# Patient Record
Sex: Female | Born: 1955
Health system: Southern US, Community
[De-identification: ages and names within clinical notes are randomized; demographics above are authoritative.]

## PROBLEM LIST (undated history)

## (undated) DIAGNOSIS — E119 Type 2 diabetes mellitus without complications: Secondary | ICD-10-CM

## (undated) DIAGNOSIS — E7800 Pure hypercholesterolemia, unspecified: Secondary | ICD-10-CM

## (undated) DIAGNOSIS — C801 Malignant (primary) neoplasm, unspecified: Secondary | ICD-10-CM

## (undated) DIAGNOSIS — B372 Candidiasis of skin and nail: Principal | ICD-10-CM

## (undated) DIAGNOSIS — C029 Malignant neoplasm of tongue, unspecified: Secondary | ICD-10-CM

## (undated) DIAGNOSIS — D630 Anemia in neoplastic disease: Principal | ICD-10-CM

## (undated) DIAGNOSIS — I10 Essential (primary) hypertension: Secondary | ICD-10-CM

## (undated) DIAGNOSIS — K219 Gastro-esophageal reflux disease without esophagitis: Secondary | ICD-10-CM

## (undated) DIAGNOSIS — Z923 Personal history of irradiation: Secondary | ICD-10-CM

## (undated) HISTORY — PX: CERVICAL ABLATION: SHX5771

## (undated) HISTORY — DX: Anemia in neoplastic disease: D63.0

## (undated) HISTORY — DX: Personal history of irradiation: Z92.3

## (undated) HISTORY — DX: Pure hypercholesterolemia, unspecified: E78.00

## (undated) HISTORY — DX: Gastro-esophageal reflux disease without esophagitis: K21.9

## (undated) HISTORY — DX: Candidiasis of skin and nail: B37.2

## (undated) HISTORY — DX: Type 2 diabetes mellitus without complications: E11.9

---

## 2000-05-26 ENCOUNTER — Other Ambulatory Visit: Admission: RE | Admit: 2000-05-26 | Discharge: 2000-05-26 | Payer: Self-pay | Admitting: Obstetrics and Gynecology

## 2001-11-03 ENCOUNTER — Other Ambulatory Visit: Admission: RE | Admit: 2001-11-03 | Discharge: 2001-11-03 | Payer: Self-pay | Admitting: Obstetrics and Gynecology

## 2002-05-02 ENCOUNTER — Ambulatory Visit (HOSPITAL_COMMUNITY): Admission: RE | Admit: 2002-05-02 | Discharge: 2002-05-02 | Payer: Self-pay | Admitting: Obstetrics and Gynecology

## 2002-05-02 ENCOUNTER — Encounter (INDEPENDENT_AMBULATORY_CARE_PROVIDER_SITE_OTHER): Payer: Self-pay | Admitting: *Deleted

## 2002-11-29 ENCOUNTER — Other Ambulatory Visit: Admission: RE | Admit: 2002-11-29 | Discharge: 2002-11-29 | Payer: Self-pay | Admitting: Obstetrics and Gynecology

## 2003-03-01 ENCOUNTER — Ambulatory Visit (HOSPITAL_COMMUNITY): Admission: RE | Admit: 2003-03-01 | Discharge: 2003-03-01 | Payer: Self-pay | Admitting: Obstetrics and Gynecology

## 2003-03-02 ENCOUNTER — Encounter (INDEPENDENT_AMBULATORY_CARE_PROVIDER_SITE_OTHER): Payer: Self-pay

## 2004-01-02 ENCOUNTER — Other Ambulatory Visit: Admission: RE | Admit: 2004-01-02 | Discharge: 2004-01-02 | Payer: Self-pay | Admitting: Obstetrics and Gynecology

## 2005-02-17 ENCOUNTER — Other Ambulatory Visit: Admission: RE | Admit: 2005-02-17 | Discharge: 2005-02-17 | Payer: Self-pay | Admitting: Obstetrics and Gynecology

## 2007-05-20 ENCOUNTER — Encounter: Admission: RE | Admit: 2007-05-20 | Discharge: 2007-05-20 | Payer: Self-pay | Admitting: Obstetrics and Gynecology

## 2007-07-01 ENCOUNTER — Encounter (INDEPENDENT_AMBULATORY_CARE_PROVIDER_SITE_OTHER): Payer: Self-pay | Admitting: Obstetrics and Gynecology

## 2007-07-01 ENCOUNTER — Ambulatory Visit (HOSPITAL_COMMUNITY): Admission: RE | Admit: 2007-07-01 | Discharge: 2007-07-01 | Payer: Self-pay | Admitting: Obstetrics and Gynecology

## 2010-11-04 NOTE — H&P (Signed)
NAME:  ARDATH, LEPAK NO.:  [id number]   MEDICAL RECORD NO.:  [id number]          PATIENT TYPE:  AMB   LOCATION:  [facility]                           FACILITY:  [facility]   PHYSICIAN:  Juluis Mire, M.D.   DATE OF BIRTH:  01-01-1956   DATE OF ADMISSION:  07/01/2007  DATE OF DISCHARGE:                              HISTORY & PHYSICAL   HISTORY:  The patient is a 55 year old gravida 1, para 1, perimenopausal  patient who presents for hysteroscopy.  The patient has had  postmenopausal bleeding with increasing flow.  She has had borderline  FSH that have begun to fluctuate.  She did do a saline infusion  ultrasound and endometrial sampling last year with finding of  proliferative endometrium.  The patient is concerned about continued  bleeding and wished to proceed with hysteroscopic evaluation for which  she is admitted at the present time.   ALLERGIES:  PENICILLIN.   MEDICATIONS:  1. Benicar.  2. Norvasc.   PAST MEDICAL HISTORY:  1. She is being actively managed for hypertension on medications as      noted.  2. Does have a history of asthmatic bronchitis.   PAST SURGICAL HISTORY:  1. She has had a previous hysteroscopy in 2003.  2. Previous cryoablation of the uterus.   OBSTETRICAL HISTORY:  One vaginal delivery.   FAMILY HISTORY:  There is a history of heart disease as well as  diverticulitis and diabetes as well as hypertension.   SOCIAL HISTORY:  No tobacco or alcohol use.   REVIEW OF SYSTEMS:  Noncontributory.   PHYSICAL EXAMINATION:  VITAL SIGNS:  Stable.  The patient is afebrile.  HEENT:  The patient is normocephalic.  Pupils equal, round and reactive  to light and accommodation.  Extraocular movements were intact.  Sclerae  and conjunctivae are clear.  Oropharynx clear.  NECK:  Without thyromegaly.  BREASTS:  No discrete masses.  LUNGS:  Clear.  CARDIOVASCULAR:  Regular rate.  No murmurs or gallops.  ABDOMEN:  Benign.  No masses, organomegaly or  tenderness.  PELVIC:  Normal external genitalia.  Vaginal mucosa is clear.  Cervix  unremarkable.  Uterus normal size, shape and contour.  Adnexa free of  masses or tenderness.  EXTREMITIES:  Trace edema.  NEUROLOGIC:  Grossly within normal limits.   IMPRESSION:  Abnormal uterine bleeding, perimenopausal in nature.   PLAN:  The patient will undergo hysteroscopy for evaluation of the  endometrial cavity.  The risks have been discussed including the risk of  infection.  Risk of hemorrhage that could require transfusion with the  risk of AIDS or hepatitis.  There is also the possible risk of  hysterectomy.  There is a risk of injury to adjacent organs including  bladder, bowel, ureters that could require further exploratory surgery.  Risk of deep venous thrombosis and pulmonary emboli.  The patient voiced  understanding of indications and risks.      Juluis Mire, M.D.  Electronically Signed     JSM/MEDQ  D:  07/01/2007  T:  07/01/2007  Job:  [id number]

## 2010-11-04 NOTE — Op Note (Signed)
NAMEDUSTINE, BERTINI                 ACCOUNT NO.:  [id number]   MEDICAL RECORD NO.:  [id number]          PATIENT TYPE:  AMB   LOCATION:  [facility]                           FACILITY:  [facility]   PHYSICIAN:  Juluis Mire, M.D.   DATE OF BIRTH:  1956/03/26   DATE OF PROCEDURE:  07/01/2007  DATE OF DISCHARGE:                               OPERATIVE REPORT   PREOPERATIVE DIAGNOSIS:  Postmenopausal bleeding.   POSTOPERATIVE DIAGNOSIS:  Postmenopausal bleeding.   PROCEDURE:  Paracervical block.  Hysteroscopy with endometrial  resections and curettings.   SURGEON:  Juluis Mire, M.D.   ANESTHESIA:  General with paracervical block.   ESTIMATED BLOOD LOSS:  Minimal.   PACKS AND DRAINS:  None.   INTRAOPERATIVE BLOOD REPLACED:  None.   COMPLICATIONS:  None.   INDICATIONS:  Noted in history and physical.   PROCEDURE:  Patient was seen, taken to OR, placed supine position.  After satisfactory level of general anesthesia was obtained, the patient  was placed in the dorsal lithotomy position using the Allen stirrups.  The perineum, vagina cleansed out Betadine and draped sterile field.  Speculum was placed in vaginal vault.  The cervix was grasped with  single-tooth tenaculum.  Paracervical blocks ensued using 1% Nesacaine.  Uterus sounded to 8 cm.  Cervix serially dilated to a size 35 Pratt  dilator.  Operative hysteroscope was introduced and the intrauterine  cavity was distended using sorbitol.  Visualization revealed a very  smooth and atrophic endometrium.  No evidence of polyps or other  abnormalities.  Multiple endometrial resections were obtained, sent for  pathological review.  We also did endometrial curettings.  Total deficit  was 25 mL.  Bleeding was minimal.  This point time the speculum and  single-tooth tenaculum removed.  The patient taken out of dorsal  lithotomy position.  Once alert was then extubated transferred to  recovery in good condition.  Sponge, instrument and needle  count  reported as correct by circulating nurse x2.      Juluis Mire, M.D.  Electronically Signed    JSM/MEDQ  D:  07/01/2007  T:  07/01/2007  Job:  [id number]

## 2010-11-07 NOTE — Op Note (Signed)
NAME:  [name], [name]                    ACCOUNT NO.:  [id number]   MEDICAL RECORD NO.:  [id number]                   PATIENT TYPE:  AMB   LOCATION:  [facility]                                  FACILITY:  [facility]   PHYSICIAN:  Juluis Mire, M.D.                DATE OF BIRTH:  Nov 07, 1955   DATE OF PROCEDURE:  05/02/2002  DATE OF DISCHARGE:                                 OPERATIVE REPORT   PREOPERATIVE DIAGNOSES:  Endometrial polyp.   POSTOPERATIVE DIAGNOSES:  Endometrial polyp.   OPERATIVE PROCEDURE:  1. Paracervical block.  2. Hysteroscopy.  3. Resection of endometrial tissue.  4. Endometrial curettings.   SURGEON:  Juluis Mire, M.D.   ANESTHESIA:  Started off with sedation, eventually went to general.   ESTIMATED BLOOD LOSS:  Minimal.   PACKS AND DRAINS:  None.   INTRAOPERATIVE BLOOD PLACED:  None.   COMPLICATIONS:  None.   INDICATIONS:  Dictated in history and physical.   PROCEDURE AS FOLLOWS:  The patient taken to the OR and placed in supine  position.  After a satisfactory level of sedation she was placed in the  dorsal lithotomy position using the Allen stirrups.  The patient was draped  out for hysteroscopy.  Speculum was placed in the vaginal vault.  The cervix  and vagina were cleansed with Betadine.  We placed a paracervical block  using 1% Xylocaine.  Cervix was secured with a single tooth tenaculum.  The  patient continued to move despite being fairly comfortable.  Decision was to  proceed with general anesthesia which we did through an LMA.  At this point  in time we were able to successfully dilate the cervix to a size 39 Pratt  dilator.  The operative hysteroscope was introduced.  Intrauterine cavity  was distended using sorbitol.  Eventually, we were able to visualize  endometrial cavity.  She had multiple shaggy endometrium.  No definitive  polyp was noted.  We resected all the tissue and was sent for pathologic  review.  There was no signs of  perforation.  We did resect anterior,  posterior, and lateral walls.  At the end of the procedure all the shaggy  endometrium had been removed.  We also did endometrial curettings.  There  was no active bleeding or signs of perforation.  The tenaculum  and speculum were then removed.  The patient taken out of the dorsal  lithotomy position.  Once alert and extubated, transferred to recovery room  in good condition.  Sponge, instrument, and needle count reported as correct  by circulating nurse.                                               Juluis Mire, M.D.    JSM/MEDQ  D:  05/02/2002  T:  05/02/2002  Job:  [id number]

## 2010-11-07 NOTE — H&P (Signed)
NAME:  [name], [name]                    ACCOUNT NO.:  [id number]   MEDICAL RECORD NO.:  [id number]                   PATIENT TYPE:  AMB   LOCATION:  [facility]                                  FACILITY:  [facility]   PHYSICIAN:  Juluis Mire, M.D.                DATE OF BIRTH:  11-01-1955   DATE OF ADMISSION:  05/02/2002  DATE OF DISCHARGE:                                HISTORY & PHYSICAL   HISTORY OF PRESENT ILLNESS:  The patient is a 55 year old gravida 1, para 1  white female who presents for hysteroscopic evaluation.   In relation to present admission, the patient has been having trouble with  menorrhagia.  Cycles are approximately seven to nine days in length.  She  changes approximately a pad every two hours for the first four days.  With  this are clots and increasing dysmenorrhea.  Saline infusion ultrasound  revealed numerous endometrial polyps.  She now presents for hysteroscopic  removal.   ALLERGIES:  PENICILLIN.   MEDICATIONS:  Diovan for blood pressure.  This is managed by Dr. Manus Gunning.  She is also on vitamins and calcium replacement.   PAST MEDICAL HISTORY:  Usual childhood diseases except for the above noted  hypertension under management by Dr. Manus Gunning.  She has had no previous  surgical or obstetrical history.   FAMILY HISTORY:  Noncontributory.   SOCIAL HISTORY:  No tobacco or alcohol use.   REVIEW OF SYSTEMS:  Noncontributory.   PHYSICAL EXAMINATION:  VITAL SIGNS:  The patient is afebrile with stable  vital signs.  HEENT:  The patient is normocephalic.  Pupils are equal, round, and reactive  to light and accommodation.  Extraocular movements are intact.  Sclerae and  conjunctivae clear.  Oropharynx clear.  NECK:  Without thyromegaly.  BREASTS:  No discreet masses.  LUNGS:  Clear.  CARDIOVASCULAR:  Regular rhythm and rate without murmurs or gallops.  ABDOMEN:  Benign.  No mass, organomegaly, or tenderness.  PELVIC:  Normal external genitalia.  Vaginal  mucosa clear.  Cervix  unremarkable.  Uterus normal size, shape, and contour.  Adnexa free of  masses or tenderness.  Rectovaginal examination is clear.  EXTREMITIES:  Trace edema.  NEUROLOGIC:  Grossly within normal limits.   IMPRESSION:  Menorrhagia with associated polyps.   PLAN:  The patient will undergo hysteroscopic evaluation and resection.  The  risks of surgery have been discussed including the risk of infection, the  rare risk of hemorrhage that could necessitate transfusion with the risk of  AIDS or  hepatitis, extensive bleeding could require hysterectomy, the risks of  uterine perforation that could lead to injury to adjacent organs including  bladder or bowel that could require further exploratory surgery, the risk of  deep venous thrombosis and pulmonary embolus.  The patient expressed  understanding of indications and risks.  Juluis Mire, M.D.    JSM/MEDQ  D:  05/02/2002  T:  05/02/2002  Job:  [id number]

## 2010-11-07 NOTE — H&P (Signed)
NAME:  [name], [name]                            ACCOUNT NO.:  [id number]   MEDICAL RECORD NO.:  [id number]                   PATIENT TYPE:  AMB   LOCATION:  [facility]                                  [facility]ACILITY:  [facility]   PHYSICIAN:  Juluis Mire, M.D.                DATE OF BIRTH:  1955/10/09   DATE OF ADMISSION:  03/01/2003  DATE OF DISCHARGE:                                HISTORY & PHYSICAL   HISTORY OF PRESENT ILLNESS:  The patient is a 55 year old gravida 1, para 1  married white female who presents for hysteroscopy along with cryo ablation.   The patient had undergone previous hysteroscopic resection for an  endometrial polyp.  Pathology was benign.  Cycles are now lasting 10 days,  three days of flow, changing pads q.2h. with clots.  She denies any  significant dysmenorrhea.  The bleeding is becoming limiting from the  patient's standpoint.  Her hemoglobin has remained stable, but borderline at  12.4.  She is hypertensive on Norvasc, therefore birth control pills are  contraindicated.  Therefore, she presents at the present time for the above  noted surgery.   ALLERGIES:  She is allergic to PENICILLIN.   MEDICATIONS:  1. Norvasc for management of hypertension.  2. ________.   PAST MEDICAL HISTORY:  History of hypertension.  She is under active  management by Bryan Lemma. Manus Gunning, M.D. who also manages her asthmatic  bronchitis.   PAST SURGICAL HISTORY:  She had the previous hysteroscopy with resection of  the endometrium done in November of 2003.   PAST OBSTETRICAL HISTORY:  One spontaneous vaginal delivery.   FAMILY HISTORY:  Noncontributory.   SOCIAL HISTORY:  No tobacco or alcohol use.   REVIEW OF SYSTEMS:  Noncontributory.   PHYSICAL EXAMINATION:  VITAL SIGNS:  The patient is afebrile with stable  vital signs.  HEENT:  The patient normocephalic.  Pupils are equal, round, and reactive to  light and accommodation.  Extraocular movements are intact.  Sclerae and  conjunctivae clear.  Oropharynx clear.  NECK:  Without thyromegaly.  BREASTS:  No discreet masses.  LUNGS:  Clear.  CARDIAC:  Regular rhythm, rate without murmurs or gallops.  ABDOMEN:  Benign.  No mass, organomegaly, or tenderness.  PELVIC:  Normal external genitalia.  Vaginal mucosa clear.  Cervix  unremarkable.  Uterus normal size, shape, and contour.  Adnexa free of  masses or tenderness.  EXTREMITIES:  Trace edema.  NEUROLOGIC:  Grossly within normal limits.   IMPRESSION:  1. Menorrhagia.  2. Hypertension.   PLAN:  The patient will undergo hysteroscopic resection of the endometrium  followed by cryo ablation.  The risks of surgery have been discussed  including the risk of infection, the risk of hemorrhage that could  necessitate transfusion with the risk of AIDS or hepatitis, the risk of  injury to adjacent organs including bladder, bowel, ureters that could  require further exploratory surgery, the risk of deep venous thrombosis and  pulmonary embolus.  The patient expressed understanding of indications and  risks.  Success rate for endometrial ablation of 70% has been quoted.  Continued bleeding problems could occur.                                               Juluis Mire, M.D.    JSM/MEDQ  D:  03/01/2003  T:  03/01/2003  Job:  [id number]

## 2010-11-07 NOTE — Op Note (Signed)
NAME:  [name], [name]                            ACCOUNT NO.:  [id number]   MEDICAL RECORD NO.:  [id number]                   PATIENT TYPE:  AMB   LOCATION:  [facility]                                  FACILITY:  [facility]   PHYSICIAN:  Juluis Mire, M.D.                DATE OF BIRTH:  10-25-1955   DATE OF PROCEDURE:  03/01/2003  DATE OF DISCHARGE:                                 OPERATIVE REPORT   PREOPERATIVE DIAGNOSES:  1. Desires sterility.  2. Menorrhagia.   POSTOPERATIVE DIAGNOSES:  1. Desires sterility.  2. Menorrhagia.  3. Probable hemorrhagic corpus luteum cyst of the right ovary.  4. Minimal pelvic endometriosis.   OPERATIVE PROCEDURES:  1. Diagnostic laparoscopy with bilateral tubal fulguration.  2. Right ovarian cystotomy.  3. Right ovarian biopsy.  4. Cautery of endometriotic implants.  5. Hysteroscopy with resection of endometrium.  6. Cryoablation.   SURGEON:  Juluis Mire, M.D.   ANESTHESIA:  General endotracheal.   ESTIMATED BLOOD LOSS:  Minimal.   PACKS AND DRAINS:  None.   INTRAOPERATIVE BLOOD REPLACEMENT:  None.   COMPLICATIONS:  None.   INDICATIONS FOR PROCEDURE:  Dictated in history and physical.  One  additional issue.  After discussion with the patient she was in desire of  permanent sterilization in view of the coming cryoablation.  We had  discussed the potential irreversibility.  A failure rate of 1 in 200 is  quoted.  Failures can be in the form of ectopic pregnancy requiring further  surgical management.  The risks of surgery were explained including the risk  of incisional infection, risk of vascular injury that could lead to  hemorrhage requiring transfusion and possible exploratory surgery, risk of  injury to adjacent organs requiring further exploratory surgery.  The  patient verbalizes understanding of indications, risks, and limitations.   PROCEDURE AS FOLLOWS:  The patient was taken to the operating room, placed  in the supine position.   After satisfactory level of general endotracheal  was obtained, the patient was placed in the dorsal lithotomy position using  the Allen stirrups.  The abdomen, perineum, and vagina were prepped out with  Betadine.  The bladder was emptied by in-and-out catheterization.  A  speculum was placed in the vaginal vault.  The cervix grasped with a single-  tooth tenaculum.  The Hulka tenaculum was then put in place.  The single-  tooth tenaculum and speculum were then removed.  The patient was draped out  as a sterile field.  A subumbilical  incision made with a knife.  The Veress  needle was introduced into the abdominal cavity.  The abdomen insufflated to  approximately three liters of carbon dioxide.  The operating laparoscope was  then introduced.  Visualization revealed no evidence of injury to adjacent  organs.  The appendix was visualized and noted to be normal.  The upper  abdomen  including the liver, tip of the gallbladder were unremarkable.  The  uterus was elevated.  She did have superficial implants of endometriosis on  the anterior abdominal wall.  The right ovary had an apparent hemorrhagic  corpus luteum cyst.  Both tubes were identified.  The mid segment of tube  was coagulated for a distance of 3 cm.  Coagulation was continued until  resistance read 0 on the meter.  Same segment was recoagulated, completely  desiccating the tube.  Coagulation did extend out to the mesosalpinx.  Both  tubes were adequately coagulated.  The 5-mm trocar was put in place in the  suprapubic area under direct visualization.  The ovary was elevated.  The  cyst was entered and clear fluid with a clot was noted consistent with a  hemorrhagic cyst.  The ovarian wall was biopsied and sent for pathological  review.  We had good hemostasis.  No active bleeding or signs of injury to  adjacent organs.  The abdomen was deflated of its carbon dioxide.  All  trocars removed.  The subumbilical incision closed with  interrupted  subcuticulars of 4-0 Vicryl.  The suprapubic incision was closed with Steri-  Strips.   The patient's legs were repositioned.  The Hulka tenaculum was then removed  and the speculum was placed in the vaginal vault.  The cervix grasped with a  single-tooth tenaculum.  The uterus sounded to approximately 8 cm.  The  cervix was serially dilated to a size 37 Pratt dilator.  Operative  hysteroscope was then introduced into the uterine cavity and was distended  using sorbitol.  Visualization revealed some endometrial thickness.  The  endometrium was resected in all quadrants down to the muscular layer.  All  of this was sent for pathologic review.  There were no signs of perforation,  active bleeding, or other complications.   Cryoablation was then performed on both the right and left uterine cornuas  with a six minute freeze in each area.  There were no signs of complications  or perforation.  At this point in time the procedure was terminated.  The  speculum was removed along with the single-tooth tenaculum.  The patient  taken out of the dorsal lithotomy position, was alert and extubated,  transferred to the recovery room in good condition.  Sponge, instrument, and  needle counts reported as correct by the circulating nurse x2.                                               Juluis Mire, M.D.    JSM/MEDQ  D:  03/01/2003  T:  03/02/2003  Job:  [id number]

## 2011-03-11 LAB — HCG, SERUM, QUALITATIVE: Preg, Serum: NEGATIVE

## 2011-03-11 LAB — CBC
HCT: 39
MCV: 89.7

## 2011-03-11 LAB — BASIC METABOLIC PANEL
BUN: 13
Calcium: 9.6
GFR calc non Af Amer: >60
Glucose, Bld: 121 — ABNORMAL HIGH
Potassium: 3.5

## 2011-06-23 HISTORY — PX: NASAL FRACTURE SURGERY: SHX718

## 2012-02-11 ENCOUNTER — Other Ambulatory Visit: Payer: Self-pay | Admitting: Dermatology

## 2012-11-01 ENCOUNTER — Encounter (HOSPITAL_COMMUNITY): Payer: Self-pay | Admitting: *Deleted

## 2012-11-01 ENCOUNTER — Emergency Department (HOSPITAL_COMMUNITY): Payer: BC Managed Care – PPO

## 2012-11-01 ENCOUNTER — Emergency Department (HOSPITAL_COMMUNITY)
Admission: EM | Admit: 2012-11-01 | Discharge: 2012-11-01 | Disposition: A | Discharge status: Home / Self Care | Payer: BC Managed Care – PPO | Attending: Emergency Medicine | Admitting: Emergency Medicine

## 2012-11-01 DIAGNOSIS — I10 Essential (primary) hypertension: Secondary | ICD-10-CM | POA: Insufficient documentation

## 2012-11-01 DIAGNOSIS — S0010XA Contusion of unspecified eyelid and periocular area, initial encounter: Secondary | ICD-10-CM | POA: Insufficient documentation

## 2012-11-01 DIAGNOSIS — S022XXA Fracture of nasal bones, initial encounter for closed fracture: Secondary | ICD-10-CM | POA: Insufficient documentation

## 2012-11-01 DIAGNOSIS — Y929 Unspecified place or not applicable: Secondary | ICD-10-CM | POA: Insufficient documentation

## 2012-11-01 DIAGNOSIS — S023XXA Fracture of orbital floor, initial encounter for closed fracture: Secondary | ICD-10-CM

## 2012-11-01 DIAGNOSIS — IMO0002 Reserved for concepts with insufficient information to code with codable children: Secondary | ICD-10-CM

## 2012-11-01 DIAGNOSIS — Y9301 Activity, walking, marching and hiking: Secondary | ICD-10-CM | POA: Insufficient documentation

## 2012-11-01 DIAGNOSIS — S0180XA Unspecified open wound of other part of head, initial encounter: Secondary | ICD-10-CM | POA: Insufficient documentation

## 2012-11-01 DIAGNOSIS — S0230XA Fracture of orbital floor, unspecified side, initial encounter for closed fracture: Secondary | ICD-10-CM | POA: Insufficient documentation

## 2012-11-01 DIAGNOSIS — E119 Type 2 diabetes mellitus without complications: Secondary | ICD-10-CM | POA: Insufficient documentation

## 2012-11-01 DIAGNOSIS — W010XXA Fall on same level from slipping, tripping and stumbling without subsequent striking against object, initial encounter: Secondary | ICD-10-CM | POA: Insufficient documentation

## 2012-11-01 DIAGNOSIS — Z79899 Other long term (current) drug therapy: Secondary | ICD-10-CM | POA: Insufficient documentation

## 2012-11-01 DIAGNOSIS — Z7982 Long term (current) use of aspirin: Secondary | ICD-10-CM | POA: Insufficient documentation

## 2012-11-01 HISTORY — DX: Type 2 diabetes mellitus without complications: E11.9

## 2012-11-01 HISTORY — DX: Essential (primary) hypertension: I10

## 2012-11-01 MED ORDER — TRAMADOL HCL 50 MG PO TABS
50.0000 mg | ORAL_TABLET | Freq: Once | ORAL | Status: AC
  Administered 2012-11-01: 50 mg via ORAL
  Filled 2012-11-01: qty 1

## 2012-11-01 MED ORDER — AZITHROMYCIN 250 MG PO TABS
500.0000 mg | ORAL_TABLET | Freq: Once | ORAL | Status: AC
  Administered 2012-11-01: 500 mg via ORAL
  Filled 2012-11-01: qty 2

## 2012-11-01 MED ORDER — FLUORESCEIN SODIUM 1 MG OP STRP: 1.0000 | ORAL_STRIP | Freq: Once | OPHTHALMIC | Status: DC

## 2012-11-01 MED ORDER — AZITHROMYCIN 500 MG PO TABS: 500.0000 mg | ORAL_TABLET | Freq: Every day | ORAL | Status: DC

## 2012-11-01 MED ORDER — TRAMADOL HCL 50 MG PO TABS: 50.0000 mg | ORAL_TABLET | Freq: Four times a day (QID) | ORAL | Status: DC | PRN

## 2012-11-01 NOTE — ED Notes (Signed)
Pt reports "stumbling over my feet" while outside. Reports she fell and hit the right side of her face. Pt has lac to right eye and swelling and bruising noted to right eye lid. Bleeding controlled at present.

## 2012-11-01 NOTE — ED Provider Notes (Signed)
History     CSN: [id number]  Arrival date & time 11/01/12  1105   First MD Initiated Contact with Patient 11/01/12 1153      Chief Complaint  Patient presents with  . Fall  . Facial Laceration    (Consider location/radiation/quality/duration/timing/severity/associated sxs/prior treatment) HPI  [name] is a 57 y.o. female complaining of complaining of right periorbitally hematoma, laceration status post slip and fall while walking earlier in the day. Patient denies loss of consciousness, nausea vomiting, dysarthria, ataxia, neck pain, chest pain, shortness of breath, abdominal pain. Patient states that she is up-to-date on her tetanus because "her doctor keeps up with all of that." She denies change in vision, diplopia, pain with eye movement. However she states it is hard to open her eye because of the significant swelling and bruising.  Past Medical History  Diagnosis Date  . Hypertension   . Diabetes mellitus without complication     "borderline diabetic"     Past Surgical History  Procedure Laterality Date  . Cervical ablation      Uterus    History reviewed. No pertinent family history.  History  Substance Use Topics  . Smoking status: Not on file  . Smokeless tobacco: Not on file  . Alcohol Use: Yes     Comment: occ    OB History   Grav Para Term Preterm Abortions TAB SAB Ect Mult Living                  Review of Systems  Constitutional: Negative for fever.  HENT: Positive for facial swelling.   Respiratory: Negative for shortness of breath.   Cardiovascular: Negative for chest pain.  Gastrointestinal: Negative for nausea, vomiting, abdominal pain and diarrhea.  Skin: Positive for wound.  Neurological: Negative for syncope.  All other systems reviewed and are negative.    Allergies  Penicillins  Home Medications   Current Outpatient Rx  Name  Route  Sig  Dispense  Refill  . amLODipine (NORVASC) 10 MG tablet   Oral   Take 10 mg by mouth  daily.         Marland Kitchen aspirin 81 MG tablet   Oral   Take 81 mg by mouth daily.         . Calcium Carbonate-Vitamin D (OS-CAL 500 + D PO)   Oral   Take 1 tablet by mouth daily.         . Coenzyme Q10 (CO Q 10 PO)   Oral   Take 1 tablet by mouth daily.         Marland Kitchen losartan-hydrochlorothiazide (HYZAAR) 100-12.5 MG per tablet   Oral   Take 1 tablet by mouth daily.         . vitamin C (ASCORBIC ACID) 500 MG tablet   Oral   Take 500 mg by mouth daily.           BP 154/67  Pulse 87  Temp(Src) 97.9 F (36.6 C) (Oral)  Resp 20  Ht 5\' 7"  (1.702 m)  Wt 215 lb (97.523 kg)  BMI 33.67 kg/m2  SpO2 99%  Physical Exam  Nursing note and vitals reviewed. Constitutional: She is oriented to person, place, and time. She appears well-developed and well-nourished. No distress.  HENT:  Head:    Mouth/Throat: Oropharynx is clear and moist.  3 cm partial-thickness laceration to right forehead crossing the eyebrow.  There is no intraoral trauma, battle sign or hemotympanum.   Eyes: Conjunctivae and EOM  are normal. Pupils are equal, round, and reactive to light. Right conjunctiva is not injected. Right conjunctiva has no hemorrhage. Right eye exhibits normal extraocular motion. Right pupil is round and reactive. Pupils are equal.  No corneal abrasion on fluorescein staining.   Neck:  No midline tenderness to palpation or step-offs appreciated. Patient has full range of motion without pain.   Cardiovascular: Normal rate, regular rhythm and intact distal pulses.   Pulmonary/Chest: Effort normal and breath sounds normal. No stridor. No respiratory distress. She has no wheezes. She has no rales. She exhibits no tenderness.  Abdominal: Soft. Bowel sounds are normal. There is no tenderness. There is no rebound and no guarding.  Musculoskeletal: Normal range of motion.  Neurological: She is alert and oriented to person, place, and time.  Cranial nerves III through XII intact, strength 5 out  of 5x4 extremities, negative pronator drift, finger to nose and heel-to-shin coordinated, sensation intact to pinprick and light touch, gait is coordinated and Romberg is negative.   Psychiatric: She has a normal mood and affect.    ED Course  Procedures (including critical care time)  LACERATION REPAIR Performed by: Wynetta Emery Authorized by: Wynetta Emery Consent: Verbal consent obtained. Risks and benefits: risks, benefits and alternatives were discussed Consent given by: patient Patient identity confirmed: Wrist band  Prepped and Draped in normal sterile fashion  Tetanus: Up to date  Laceration Location: Right forehead  Laceration Length: 3 cm  Anesthesia: Local   Local anesthetic: 2% lidocaine with epinephrine  Anesthetic total: 3 ml  Irrigation method: syringe  Amount of cleaning: copious   Wound explored to depth in good light on a bloodless field with no foreign bodies seen or palpated.   Skin closure: 7-0 Ethilon   Number of sutures: 3   Technique: Simple interrupted   Patient tolerance: Patient tolerated the procedure well with no immediate complications.  Antibx ointment applied. Instructions for care discussed verbally and patient provided with additional written instructions for homecare and f/u.  Labs Reviewed - No data to display Ct Orbitss W/o Cm  11/01/2012  *RADIOLOGY REPORT*  Clinical Data: Fall.  Trauma to right side of face.  Laceration. Right periorbital soft tissue swelling  CT ORBITS WITHOUT CONTRAST  Technique:  Multidetector CT imaging of the orbits was performed following the standard protocol without intravenous contrast.  Comparison: None.  Findings: A right orbital floor fracture is present.  There is blood in the right maxillary sinus.  Fat is herniated through the fracture without associated muscle.    Right periorbital soft tissue swelling is present. There is a fracture at the base of the frontal process of the right maxilla.   Minimally-displaced right nasal bone fractures are also evident.  The nasal septum is intact. The remaining paranasal sinuses are clear.  The right zygomatic arch is intact.  Limited imaging of the brain is unremarkable.  Incidental note is made of an on erupted supernumerary tooth in the maxilla.  IMPRESSION:  1.  Right orbital floor fracture without entrapment of the inferior rectus muscle. 2.  No acute blood products in the right maxillary sinus. 3.  Frontal process of right maxilla and right nasal bone fractures with minimal displacement. 4.  Extensive right periorbital hematoma without additional fractures.   Original Report Authenticated By: Marin Roberts, M.D.      1. Laceration   2. Nasal bone fracture, closed, initial encounter   3. Orbital floor fracture, closed, initial encounter  MDM   Filed Vitals:   11/01/12 1118 11/01/12 1449  BP: 154/67 128/61  Pulse: 87 69  Temp: 97.9 F (36.6 C)   TempSrc: Oral   Resp: 20 18  Height: 5\' 7"  (1.702 m)   Weight: 215 lb (97.523 kg)   SpO2: 99% 98%     DORIA FERN is a 57 y.o. female with a right forehead laceration, right eye hematoma. Patient has full extraocular range of motion without pain or diplopia I doubt entrapment. I have had to convince the patient to take pain medication.  Orbital CT shows a orbital floor fracture or with nasal bone fracture. Patient is penicillin allergic so we'll give her azithromycin.  Laceration closed with 3 simple interrupted sutures.  I have advised her to start decongestants and given her ENT followup.  Medications  azithromycin (ZITHROMAX) tablet 500 mg (not administered)  traMADol (ULTRAM) tablet 50 mg (50 mg Oral Given 11/01/12 1310)    VSS and patient is appropriate for, and amenable to, discharge at this time. Pt verbalized understanding and agrees with care plan. Outpatient follow-up and return precautions given.    Discharge Medication List as of 11/01/2012  2:39 PM      START taking these medications   Details  azithromycin (ZITHROMAX) 500 MG tablet Take 1 tablet (500 mg total) by mouth daily., Starting 11/01/2012, Until Discontinued, Print    traMADol (ULTRAM) 50 MG tablet Take 1 tablet (50 mg total) by mouth every 6 (six) hours as needed for pain., Starting 11/01/2012, Until Discontinued, [employer], PA-C 11/02/12 [phone]

## 2012-11-03 NOTE — ED Provider Notes (Signed)
Medical screening examination/treatment/procedure(s) were performed by non-physician practitioner and as supervising physician I was immediately available for consultation/collaboration.  Doug Sou, MD 11/03/12 2006

## 2014-04-13 ENCOUNTER — Other Ambulatory Visit: Payer: Self-pay | Admitting: Family Medicine

## 2014-04-13 DIAGNOSIS — R591 Generalized enlarged lymph nodes: Secondary | ICD-10-CM

## 2014-04-13 DIAGNOSIS — R599 Enlarged lymph nodes, unspecified: Secondary | ICD-10-CM

## 2014-04-20 ENCOUNTER — Ambulatory Visit
Admission: RE | Admit: 2014-04-20 | Discharge: 2014-04-20 | Disposition: A | Discharge status: Home / Self Care | Payer: BC Managed Care – PPO | Source: Ambulatory Visit | Attending: Family Medicine | Admitting: Family Medicine

## 2014-04-20 DIAGNOSIS — R591 Generalized enlarged lymph nodes: Secondary | ICD-10-CM

## 2014-04-20 DIAGNOSIS — R599 Enlarged lymph nodes, unspecified: Secondary | ICD-10-CM

## 2014-04-20 MED ORDER — IOHEXOL 300 MG/ML  SOLN
75.0000 mL | Freq: Once | INTRAMUSCULAR | Status: AC | PRN
  Administered 2014-04-20: 75 mL via INTRAVENOUS

## 2014-04-23 ENCOUNTER — Other Ambulatory Visit (HOSPITAL_COMMUNITY)
Admission: RE | Admit: 2014-04-23 | Discharge: 2014-04-23 | Disposition: A | Discharge status: Home / Self Care | Payer: BC Managed Care – PPO | Source: Ambulatory Visit | Attending: Otolaryngology | Admitting: Otolaryngology

## 2014-04-23 ENCOUNTER — Other Ambulatory Visit: Payer: Self-pay | Admitting: Otolaryngology

## 2014-04-23 DIAGNOSIS — R87810 Cervical high risk human papillomavirus (HPV) DNA test positive: Secondary | ICD-10-CM | POA: Diagnosis present

## 2014-04-23 DIAGNOSIS — C969 Malignant neoplasm of lymphoid, hematopoietic and related tissue, unspecified: Secondary | ICD-10-CM | POA: Diagnosis present

## 2014-04-25 ENCOUNTER — Other Ambulatory Visit: Payer: Self-pay | Admitting: Otolaryngology

## 2014-04-25 DIAGNOSIS — E041 Nontoxic single thyroid nodule: Secondary | ICD-10-CM

## 2014-04-30 ENCOUNTER — Other Ambulatory Visit (HOSPITAL_COMMUNITY): Payer: Self-pay | Admitting: Otolaryngology

## 2014-04-30 DIAGNOSIS — D3709 Neoplasm of uncertain behavior of other specified sites of the oral cavity: Secondary | ICD-10-CM

## 2014-04-30 DIAGNOSIS — D3705 Neoplasm of uncertain behavior of pharynx: Secondary | ICD-10-CM

## 2014-04-30 DIAGNOSIS — R590 Localized enlarged lymph nodes: Secondary | ICD-10-CM

## 2014-04-30 DIAGNOSIS — D3701 Neoplasm of uncertain behavior of lip: Secondary | ICD-10-CM

## 2014-05-01 ENCOUNTER — Telehealth: Payer: Self-pay | Admitting: *Deleted

## 2014-05-01 NOTE — Telephone Encounter (Signed)
Called pt to introduce myself as the oncology nurse navigator that works with Dr. Isidore Moos: 1. Briefly explained my role as a member of her Care Team,  2. Indicated that I would be joining her during her during her consult appt with Dr. Isidore Moos next Friday, 3. Confirmed her understanding of the location of [place], explained the arrival procedure.   4. Discussed the scheduling of a dental evaluation with Dr. Tommie Raymond. She indicated Dr. Erik Obey had already discussed the purpose of this evaluation and has referred her to him.  She is waiting to hear from Dr. Ritta Slot office for scheduling of an appt. She verbalized understanding of information provided.  I provided her my phone #, encouraged her to call me should she have any questions prior to her appt.  [name], RN, BSN, [facility] [phone]

## 2014-05-03 ENCOUNTER — Ambulatory Visit
Admission: RE | Admit: 2014-05-03 | Discharge: 2014-05-03 | Disposition: A | Discharge status: Home / Self Care | Payer: BC Managed Care – PPO | Source: Ambulatory Visit | Attending: Otolaryngology | Admitting: Otolaryngology

## 2014-05-03 DIAGNOSIS — E041 Nontoxic single thyroid nodule: Secondary | ICD-10-CM

## 2014-05-04 ENCOUNTER — Encounter: Payer: Self-pay | Admitting: *Deleted

## 2014-05-04 ENCOUNTER — Encounter (HOSPITAL_COMMUNITY): Payer: Self-pay | Admitting: Dentistry

## 2014-05-04 ENCOUNTER — Ambulatory Visit (HOSPITAL_COMMUNITY): Payer: Self-pay | Admitting: Dentistry

## 2014-05-04 ENCOUNTER — Encounter (HOSPITAL_COMMUNITY): Payer: Self-pay

## 2014-05-04 VITALS — BP 139/64 | HR 82 | Temp 97.8°F

## 2014-05-04 DIAGNOSIS — Z0189 Encounter for other specified special examinations: Secondary | ICD-10-CM

## 2014-05-04 DIAGNOSIS — K0530 Chronic periodontitis, unspecified: Secondary | ICD-10-CM

## 2014-05-04 DIAGNOSIS — K08102 Complete loss of teeth, unspecified cause, class II: Secondary | ICD-10-CM

## 2014-05-04 DIAGNOSIS — D3705 Neoplasm of uncertain behavior of pharynx: Secondary | ICD-10-CM

## 2014-05-04 DIAGNOSIS — M270 Developmental disorders of jaws: Secondary | ICD-10-CM

## 2014-05-04 DIAGNOSIS — K036 Deposits [accretions] on teeth: Secondary | ICD-10-CM

## 2014-05-04 DIAGNOSIS — C109 Malignant neoplasm of oropharynx, unspecified: Secondary | ICD-10-CM

## 2014-05-04 DIAGNOSIS — Z01818 Encounter for other preprocedural examination: Secondary | ICD-10-CM

## 2014-05-04 DIAGNOSIS — K148 Other diseases of tongue: Secondary | ICD-10-CM

## 2014-05-04 DIAGNOSIS — R220 Localized swelling, mass and lump, head: Secondary | ICD-10-CM

## 2014-05-04 DIAGNOSIS — K045 Chronic apical periodontitis: Secondary | ICD-10-CM

## 2014-05-04 DIAGNOSIS — M264 Malocclusion, unspecified: Secondary | ICD-10-CM

## 2014-05-04 NOTE — Progress Notes (Signed)
DENTAL CONSULTATION  Date of Consultation:  05/04/2014 Patient Name:   [name] Date of Birth:   12-24-1955 Medical Record Number: [id number]  VITALS: BP 139/64 mmHg  Pulse 82  Temp(Src) 97.8 F (36.6 C) (Oral)  CHIEF COMPLAINT: The patient was referred by Dr. Erik Obey for a medically necessary pre-chemoradiation therapy dental protocol examination.  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 Patient with anticipated chemoradiation therapy. Patient is now seen as part of a pre-chemoradiation therapy dental protocol examination.  The patient currently denies acute toothaches, swellings, or abscesses. Patient was last seen by [facility] for a dental cleaning and fillings approximately 2-3 years ago. Patient has been unable to keep routine dental appointments due to loss of dental insurance with her job. The patient had previously seen Dr. Eula Listen for her regular dental care and will be continuing her future dental care with his office.  The patient has not had any recent root canal therapy or crown or bridge therapy over the past 3-4 years by her report.  PROBLEM LIST: Patient Active Problem List   Diagnosis Date Noted  . Neoplasm of uncertain behavior of oropharynx 05/04/2014  . Base of tongue mass 05/04/2014    PMH: Past Medical History  Diagnosis Date  . Hypertension   . Diabetes mellitus without complication     "borderline diabetic"   . Hypercholesteremia     PSH: Past Surgical History  Procedure Laterality Date  . Cervical ablation      Uterus  . Nasal fracture surgery  5465    Dr. Erik Obey    ALLERGIES: Allergies  Allergen Reactions  . Penicillins Hives and Other (See Comments)    "Tongue swelling"    MEDICATIONS: Current Outpatient Prescriptions  Medication Sig Dispense Refill  . ALPRAZolam (XANAX) 0.25 MG tablet Take 0.25 mg by mouth as needed for anxiety.     Marland Kitchen amLODipine (NORVASC) 10 MG tablet Take 10 mg by mouth daily.    Marland Kitchen aspirin 81 MG tablet Take 81 mg by mouth daily.    Marland Kitchen atorvastatin (LIPITOR) 10 MG tablet Take 10 mg by mouth daily.    Marland Kitchen losartan-hydrochlorothiazide (HYZAAR) 100-12.5 MG per tablet Take 1 tablet by mouth daily.    . metFORMIN (GLUCOPHAGE) 500 MG tablet Take 500 mg by mouth daily with breakfast.    . Calcium Carbonate-Vitamin D (OS-CAL 500 + D PO) Take 1 tablet by mouth daily.    . vitamin C (ASCORBIC ACID) 500 MG tablet Take 500 mg by mouth daily.     No current facility-administered medications for this visit.    LABS: Lab Results  Component Value Date   WBC 7.6 06/30/2007   HGB 13.7 06/30/2007   HCT 39.0 06/30/2007   MCV 89.7 06/30/2007   PLT 242 06/30/2007      Component Value Date/Time   NA 134* 06/30/2007 1432   K 3.5 06/30/2007 1432   CL 99 06/30/2007 1432   CO2 27 06/30/2007 1432   GLUCOSE 121* 06/30/2007 1432   BUN 13 06/30/2007 1432   CREATININE 0.68 06/30/2007 1432   CALCIUM 9.6 06/30/2007 1432   GFRNONAA >60 06/30/2007 1432   GFRAA  06/30/2007 1432    >60        The eGFR has been calculated using the MDRD equation. This calculation has not been validated in all clinical situations. eGFR's persistently <60 mL/min signify possible Chronic  Kidney Disease.   No results found for: INR, PROTIME No results found for: PTT  SOCIAL HISTORY: History   Social History  . Marital Status: Married    Spouse Name: N/A    Number of Children: 1  . Years of Education: N/A   Occupational History  . Not on file.   Social History Main Topics  . Smoking status: Former Smoker -- 0.50 packs/day for 5 years    Types: Cigarettes    Quit date: 06/22/1974  . Smokeless tobacco: Never Used  . Alcohol Use: 0.0 oz/week    0 Not specified per week     Comment: occl wine  . Drug Use: No  . Sexual Activity: Not on file   Other Topics Concern  . Not on file   Social History Narrative    FAMILY  HISTORY: Family History  Problem Relation Age of Onset  . Hepatitis C Mother   . Heart failure Father     REVIEW OF SYSTEMS: Reviewed with the patient included in dental record.  DENTAL HISTORY: CHIEF COMPLAINT: The patient was referred by Dr. Erik Obey for a medically necessary pre-chemoradiation therapy dental protocol examination.  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 Patient with anticipated chemoradiation therapy. Patient is now seen as part of a pre-chemoradiation therapy dental protocol examination.  The patient currently denies acute toothaches, swellings, or abscesses. Patient was last seen by [facility] for a dental cleaning and fillings approximately 2-3 years ago. Patient has been unable to keep routine dental appointments due to loss of dental insurance with her job. The patient had previously seen Dr. Eula Listen for her regular dental care and will be continuing her future dental care with his office.  The patient has not had any recent root canal therapy or crown or bridge therapy over the past 3-4 years by her report.   DENTAL EXAMINATION: GENERAL: The patient is a well-developed, well-nourished female in no acute distress. HEAD AND NECK: There is right neck lymphadenopathy. I do not palpate any left neck lymphadenopathy. The patient denies acute TMJ symptoms. INTRAORAL EXAM: The patient has normal saliva. The patient has a deep palatal vault. The patient has bilateral mandibular lingual tori. There is no evidence of intraoral abscess formation. DENTITION: The patient is missing tooth numbers 1, 2, 15, 16, 17, 18, 30, and 32. PERIODONTAL: The patient has chronic periodontitis with plaque and calculus accumulations, selective areas gingival recession, and incipient mandibular anterior tooth mobility. DENTAL CARIES/SUBOPTIMAL RESTORATIONS: The patient may have caries on the  distal of tooth #25 or this may be a radiolucent dental resin. This will need to be reevaluated after her dental cleaning with her primary dentist. ENDODONTIC: The patient currently denies acute pulpitis symptoms. Patient has periapical radiolucency associated with tooth #12 and 20. Tooth #12 has had previous root canal therapy with a persistent periapical radiolucency. Patient also has previous root canal therapies associated with tooth numbers 4, 5, 9, 19, 21. None of the teeth with root canal therapy have any acute symptoms. CROWN AND BRIDGE: The patient has multiple crown restorations that appear to be acceptable. PROSTHODONTIC: The patient has no partial dentures. OCCLUSION: The patient has a poor occlusal scheme secondary to missing teeth, supra-eruption and drifting of the unopposed teeth into the edentulous areas, multiple rotated teeth, and crossbites associated with tooth numbers 3/31 and 14/19. The occlusion is stable however.  RADIOGRAPHIC INTERPRETATION:  An orthopantogram was taken and supplemented with a full series of dental radiographs. There are multiple missing teeth. There is supra-eruption and drifting of the unopposed teeth into the edentulous areas. There are multiple rotated teeth. There are multiple root canal therapies. There is a persistent periapical radiolucency associated with the apex of tooth #12. There is a radiolucency associated with the apex of tooth #20.  There are multiple root canal therapies associated with tooth numbers 4, 5, 9, 12, 19, and 21. There are multiple dental restorations noted. There is incipient to moderate bone loss noted.  ASSESSMENTS: 1. 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 2. Anticipated chemoradiation therapy 3. Pre-chemoradiation there be dental protocol examination 4. Chronic periodontitis of bone loss 5. Accretions 6. Gingival recession 7. Incipient mandibular anterior tooth  mobility 8. Chronic apical periodontitis  9. Multiple root canal therapies with persistent periapical radiolucency associated with the apex of tooth #12 10. No acute pulpitis symptoms 11. Mandibular anterior incisal attrition 12. Bilateral mandibular lingual tori 13. Deep palatal vault 14. Possible dental caries associated with the distal of tooth #25. Need assessment after anticipated dental cleaning appointment. 15. Poor occlusal scheme secondary to missing teeth, posterior crossbite, and supra-eruption and drifting of the unopposed teeth into the edentulous areas 16. Stable occlusion  PLAN/RECOMMENDATIONS: 1. I discussed the risks, benefits, and complications of various treatment options with the patient in relationship to her medical and dental conditions, anticipated chemoradiation therapy, and chemoradiation therapy side effects to include xerostomia, radiation caries, trismus, mucositis, taste changes, gum and jawbone changes, and risk for infection and osteoradionecrosis. We discussed various treatment options to include no treatment, selective extractions of teeth in the primary field of radiation therapy, alveoloplasty, pre-prosthetic surgery as indicated, periodontal therapy, dental restorations, root canal therapy, crown and bridge therapy, implant therapy, and replacement of missing teeth as indicated. We discussed referral to an endodontist for evaluation for retreatment or apical surgery of tooth #12 and root canal therapy associated with tooth #20. We discussed possible referral to an oral surgeon for extraction of tooth #31. We discussed follow-up with Dr. Eula Listen (primary dentist) for dental cleaning and evaluation for possible dental caries on tooth #25 on the distal. We discussed impressions for the fabrication of fluoride trays and scatter guards. The patient currently wishes to proceed with impressions today with subsequent fabrication of fluoride trays and scatter guards as  needed. Decision on referral to an endodontist or oral surgeon will be made once final anticipated doses and ports are revealed by Dr. Isidore Moos. Hopefully Dr. Isidore Moos will be able to keep tooth #31 out of the primary field of radiation therapy.   2. Discussion of findings with medical team and coordination of future medical and dental care as needed.  I spent in excess of  120 minutes during the conduct of this consultation and >50% of this time involved direct face-to-face encounter for counseling and/or coordination of the patient's care.    Lenn Cal, DDS

## 2014-05-04 NOTE — Progress Notes (Addendum)
Duplicate entry

## 2014-05-04 NOTE — Patient Instructions (Signed)
RADIATION THERAPY AND DECISIONS REGARDING YOUR TEETH  Xerostomia (dry mouth) Your salivary glands may be in the filed of radiation.  Radiation may include all or part of your saliva glands.  This will cause your saliva to dry up and you will have a dry mouth.  The dry mouth will be for the rest of your life unless your radiation oncologist tells you otherwise.  Your saliva has many functions:  Saliva wets your tongue for speaking.  It coats your teeth and the inside of your mouth for easier movement.  It helps with chewing and swallowing food.  It helps clean away harmful acid and toxic products made by the germs in your mouth, therefore it helps prevent cavities.  It kills some germs in your mouth and helps to prevent gum disease.  It helps to carry flavor to your taste buds.  Once you have lost your saliva you will be at higher risk for tooth decay and gum disease.  What can be done to help improve your mouth when there's not enough saliva:  1.  Your dentist may give a prescription for Salagen.  It will not bring back all of your saliva but may bring back some of it.  Also your saliva may be thick and ropy or white and foamy. It will not feel like it use to feel.  2.  You will need to swish with water every time your mouth feels dry.  YOU CANNOT suck on any cough drops, mints, lemon drops, candy, vitamin C or any other products.  You cannot use anything other than water to make your mouth feel less dry.  If you want to drink anything else you have to drink it all at once and brush afterwards.  Be sure to discuss the details of your diet habits with your dentist or hygienist.  Radiation caries: This is decay that happens very quickly once your mouth is very dry due to radiation therapy.  Normally cavities take six months to two years to become a problem.  When you have dry mouth cavities may take as little as eight weeks to cause you a problem.  This is why dental check ups every two  months are necessary as long as you have a dry mouth. Radiation caries typically, but not always, start at your gum line where it is hard to see the cavity.  It is therefore also hard to fill these cavities adequately.  This high rate of cavities happens because your mouth no longer has saliva and therefore the acid made by the germs starts the decay process.  Whenever you eat anything the germs in your mouth change the food into acid.  The acid then burns a small hole in your tooth.  This small hole is the beginning of a cavity.  If this is not treated then it will grow bigger and become a cavity.  The way to avoid this hole getting bigger is to use fluoride every evening as prescribed by your dentist.  You have to make sure that your teeth are very clean before you use the fluoride.  This fluoride in turn will strengthen your teeth and prepare them for another day of fighting acid.  If you develop radiation caries many times the damage is so large that you will have to have all your teeth removed.  This could be a big problem if some of these teeth are in the field of radiation.  Further details of why this could be   a big problem will follow.  (See Osteoradionecrosis).  Loss of taste (dysgeusia) This happens to varying degrees once you've had radiation therapy to your jaw region.  Many times taste is not completely lost but becomes limited.  The loss of taste is mostly due to radiation affecting your taste buds.  However if you have no saliva in your mouth to carry the flavor to your taste buds it would be difficult for your taste buds to taste anything.  That is why using water or a prescription for Salagen prior to meals and during meals may help with some of the taste.  Keep in mind that taste generally returns very slowly over the course of several months or several years after radiation therapy.  Don't give up hope.  Trismus According to your Radiation Oncologist your TMJ or jaw joints are going to be  partially or fully in the field of radiation.  This means that over time the muscles that help you open and close your mouth may get stiff.  This will potentially result in your not being able to open your mouth wide enough or as wide as you can open it now.  Le me give you an example of how slowly this happens and how unaware people are of it.  A gentlemen that had radiation therapy two years ago came back to me complaining that bananas are just too large for him to be able to fit them in between his teeth.  He was not able to open wide enough to bite into a banana.  This happens slowly and over a period of time.  What do we do to try and prevent this?  Your dentist will probably give you a stack of sticks called a trismus exercise device .  This stack will help your remind your muscles and your jaw joint to open up to the same distance every day.  Use these sticks every morning when you wake up according to the instructions given by the dentist.   You must use these sticks for at least one to two years after radiation therapy.  The reason for that is because it happens so slowly and keeps going on for about two years after radiation therapy.  Your hospital dentist will help you monitor your mouth opening and make sure that it's not getting smaller.  Osteoradionecrosis (ORN) This is a condition where your jaw bone after having had radiation therapy becomes very dry.  It has very little blood supply to keep it alive.  If you develop a cavity that turns into an abscess or an infection then the jaw bone does not have enough blood supply to help fight the infection.  At this point it is very likely that the infection could cause the death of your jaw bone.  When you have dead bone it has to be removed.  Therefore you might end up having to have surgery to remove part of your jaw bone, the part of the jaw bone that has been affected.   Healing is also a problem if you are to have surgery in the areas where the bone  has had radiation therapy.  The same reasons apply.  If you have surgery you need more blood supply which is not available.  When blood supply and oxygen are not available again, there is a chance for the bone to die.  Occasionally ORN happens on its own with no obvious reason.  This is quite rare.  We believe that   patients who continue to smoke and/or drink alcohol have a higher chance of having this bone problem.  Therefore once your jaw bone has had radiation therapy if there are any teeth in that area, you should never have them pulled.  You should also never have any surgery on your teeth or gums in that area unless the oral surgeon or Periodontist is aware of your history of radiation. There is some expensive management techniques that might be used to limit your risks.  The risks for ORN either from infection or spontaneous ( or on it's own) are life long.    TRISMUS  Trismus is a condition where the jaw does not allow the mouth to open as wide as it usually does.  This can happen almost suddenly, or in other cases the process is so slow, it is hard to notice it-until it is too far along.  When the jaw joints and/or muscles have been exposed to radiation treatments, the onset of Trismus is very slow.  This is because the muscles are losing their stretching ability over a long period of time, as long as 2 YEARS after the end of radiation.  It is therefore important to exercise these muscles and joints.  TRISMUS EXERCISES   Stack of tongue depressors measuring the same or a little less than the last documented MIO (Maximum Interincisal Opening).  Secure them with a rubber band on both ends.  Place the stack in the patient's mouth, supporting the other end.  Allow 30 seconds for muscle stretching.  Rest for a few seconds.  Repeat 3-5 times  For all radiation patients, this exercise is recommended in the mornings and evenings unless otherwise instructed.  The exercise should be done for  a period of 2 YEARS after the end of radiation.  MIO should be checked routinely on recall dental visits by the general dentist or the hospital dentist.  The patient is advised to report any changes, soreness, or difficulties encountered when doing the exercises.  TRISMUS  Trismus is a condition where the jaw does not allow the mouth to open as wide as it usually does.  This can happen almost suddenly, or in other cases the process is so slow, it is hard to notice it-until it is too far along.  When the jaw joints and/or muscles have been exposed to radiation treatments, the onset of Trismus is very slow.  This is because the muscles are losing their stretching ability over a long period of time, as long as 2 YEARS after the end of radiation.  It is therefore important to exercise these muscles and joints.  TRISMUS EXERCISES   Stack of tongue depressors measuring the same or a little less than the last documented MIO (Maximum Interincisal Opening).  Secure them with a rubber band on both ends.  Place the stack in the patient's mouth, supporting the other end.  Allow 30 seconds for muscle stretching.  Rest for a few seconds.  Repeat 3-5 times  For all radiation patients, this exercise is recommended in the mornings and evenings unless otherwise instructed.  The exercise should be done for a period of 2 YEARS after the end of radiation.  MIO should be checked routinely on recall dental visits by the general dentist or the hospital dentist.  The patient is advised to report any changes, soreness, or difficulties encountered when doing the exercises.

## 2014-05-04 NOTE — Progress Notes (Signed)
Met with patient and her son after dental consult with Dr. Enrique Sack per his request.  Patient very anxious about upcoming tmts.  I provided overview of RT  and chemotherapy tmts, discussed role of feeding tube, provided tour of RadOnc, including CT SIM and Tomo. I addressed her concerns about hair loss from chemo and positive HPV status.  She expressed that she felt better after our conversation.  I discussed the [facility] [place] scheduled for next Wednesday afternoon and her willingness to attend.  She confirmed her interest.  I provided her an arrival time of 12:00, indicated I would call her next week prior to clinic, encouraged her to call me should she have questions prior to my call.  She verbalized understanding.  Alice Orem, RN, BSN, [facility] [place] [phone]

## 2014-05-07 NOTE — Progress Notes (Signed)
Head and Neck Cancer Location of Tumor / Histology: LYMPHOMA    RIGHT BASE OF TONGUE. LEVEL 3 LYMPH NODE  Ms. [name]. [name] presented on 04/23/14 to [facility] with a 6 week history of report of feeling "like something was in her throat", and that she noted a lump in her neck. 6 weeks prior to this she was . 6 weeks prior to this visit, she was seen by Dr. Marisue Humble and was given Sudafed and a topical  Anesthetic.  After 4-6 weeks, her symptoms had worsened and she was treated with a course of Zithromax with minimal effect. Within a 2 week period she noted the lump in her mid right neck. Described as having a slight potatoe, raspy quality to her voice. Strong Gag Reflex.   CT scan revealed a Large right base of tongue mass and several level II and III suspicious lymph nodes, and a 1.8 cm right thyroid nodule "lump" behind her tongue, exophytic mass right base of tongue and several lymph nodes.  Dr. Redmond Baseman performed a needle aspiration biopsy on this visit.  Biopsies of LYMPH NODE (if applicable) revealed:  53[birth year]46 Diagnosis LYMPH NODE, FINE NEEDLE ASPIRATION LEVEL 3 NODE (SPECIMEN 1 OF 1 COLLECTED ON 04/23/2014) MALIGNANT CELLS PRESENT SEE COMMENT  COMMENT: THERE ARE MALIGNANT CELLS PRESENT, CONSISTENT WITH POORLY DIFFERENTIATED SQUAMOUS CELL CARCINOMA.  PER CLINICIAN REQUEST, A P16 IMMUNOHISTOCHEMICAL STAIN  WILL BE PERFORMED ON THE CELL BLOCK AND THE RESULTS REPORTED SEPARATELY   Nutrition Status:  Weight changes: ?  Swallowing status:   Plans, if any, for PEG tube:  Tobacco/Marijuana/Snuff/ETOH use: smoked 1/2 ppd x 5 years, Quit in 1976, Occasional wine, No Illicit Drug Use  Past/Anticipated interventions by otolaryngology, if any: Dr.  Orpah Greek Bates:Biopsy  Past/Anticipated interventions by medical oncology, if any: Dr.Ni Alvy Bimler - appointment 05/09/14  Referrals yet, to any of the following?  Social Work?   Dentistry? Dr. Teena Dunk - 07/04/13  Swallowing therapy?    Nutrition? Dory Peru - 05/09/14  Med/Onc? Dr. Heath Lark 05/09/14  PEG placement?   SAFETY ISSUES:  Prior radiation? No  Pacemaker/ICD?No  Possible current pregnancy? No  Is the patient on methotrexate? No  Current Complaints / other details:

## 2014-05-08 ENCOUNTER — Telehealth: Payer: Self-pay | Admitting: *Deleted

## 2014-05-08 ENCOUNTER — Telehealth: Payer: Self-pay | Admitting: Radiation Oncology

## 2014-05-08 NOTE — Progress Notes (Addendum)
Radiation Oncology         [phone] ________________________________  Initial outpatient Consultation  Name: [name] MRN: [id number]  Date: 05/09/2014  DOB: [birth year]  CC:   Jodi Marble MD  REFERRING PHYSICIAN: Jodi Marble MD  DIAGNOSIS:    ICD-9-CM ICD-10-CM   1. 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 female who presented with sensation of post nasal dripping, fullness in throat,  and a right neck mass.  CT imaging 10/30 showed: large 4.1 cm BOT mass with right level II/III lymphadenopathy and right thyroid nodule.   Biopsy of right level III node showed: poorly differentiated squamous cell carcinoma, p16+  [place] of thyroid gland was done; findings do not meet current SRU consensus criteria for biopsy. Follow-up by clinical exam is recommended  PET scan on 11-18 - today - negative for metastases.  It highlights disease in the base of tongue and right neck.  She denies dysphagia, stridor, SOB, prior RT, prior ETOH abuse. Quit smoking 39 years ago.  Has lost 26lb in 1 year to address her diabetes. No pain today.     PREVIOUS RADIATION THERAPY: No  PAST MEDICAL HISTORY:  has a past medical history of Hypertension; Diabetes mellitus without complication; and Hypercholesteremia.  Basal cell carcinoma, leg  PAST SURGICAL HISTORY: Past Surgical History  Procedure Laterality Date  . Cervical ablation      Uterus  . Nasal fracture surgery  6767    Dr. Erik Obey    FAMILY HISTORY: family history includes Cancer in her brother, paternal grandfather, and sister; Heart failure in her father; Hepatitis C in her mother.  SOCIAL HISTORY:  reports that she quit smoking about 39 years ago. Her smoking use included Cigarettes. She has a 2.5 pack-year smoking history. She has never used smokeless  tobacco. She reports that she drinks alcohol. She reports that she does not use illicit drugs.  ALLERGIES: Penicillins  MEDICATIONS:  Current Outpatient Prescriptions  Medication Sig Dispense Refill  . ALPRAZolam (XANAX) 0.25 MG tablet Take 0.25 mg by mouth as needed for anxiety.    Marland Kitchen amLODipine (NORVASC) 10 MG tablet Take 10 mg by mouth daily.    Marland Kitchen aspirin 81 MG tablet Take 81 mg by mouth daily.    Marland Kitchen atorvastatin (LIPITOR) 10 MG tablet Take 10 mg by mouth daily.    . Prenatal Vit-Fe Fumarate-FA (MULTIVITAMIN-PRENATAL) 27-0.8 MG TABS tablet Take 1 tablet by mouth daily at 12 noon.    Marland Kitchen dexamethasone (DECADRON) 4 MG tablet Start the day before and the day after chemotherapy, 2 times a day every 3 weeks 30 tablet 1  . lidocaine-prilocaine (EMLA) cream Apply to affected area once 30 g 3  . losartan-hydrochlorothiazide (HYZAAR) 100-12.5 MG per tablet Take 1 tablet by mouth daily.    . metFORMIN (GLUCOPHAGE) 500 MG tablet Take 500 mg by mouth daily with breakfast.    . ondansetron (ZOFRAN) 8 MG tablet Take 1 tablet (8 mg total) by mouth every 8 (eight) hours as needed (Nausea or vomiting). 30 tablet 1  . prochlorperazine (COMPAZINE) 10 MG tablet Take 1 tablet (10 mg total) by mouth every 6 (six) hours as needed (Nausea or vomiting). 30 tablet 1  . sodium fluoride (FLUORISHIELD) 1.1 % GEL dental gel Instill one drop of gel per tooth space of fluoride tray.  Place over teeth for 5 minutes. Remove. Spit out excess. Repeat nightly. 120 mL prn   No current facility-administered medications for this encounter.    REVIEW OF SYSTEMS:  Notable for that above.   PHYSICAL EXAM:  height is 5' 7.5" (1.715 m) and weight is 200 lb 1.6 oz (90.765 kg). Her oral temperature is 98.4 F (36.9 C). Her blood pressure is 134/56 and her pulse is 95. Her respiration is 20 and oxygen saturation is 100%.   General: Alert and oriented, in no acute distress HEENT: Head is normocephalic. Extraocular movements are intact.  Oropharynx - brisk gag reflex; but I believe I saw the superior portion of an exophytic mass behind the left tonsillar region Neck: +right level III 3cm mass; otherwise, no cervical or supraclavicular lymphadenopathy. Heart: Regular in rate and rhythm with no murmurs, rubs, or gallops. Chest: Clear to auscultation bilaterally, with no rhonchi, wheezes, or rales. Abdomen: Soft, nontender, nondistended, with no rigidity or guarding. Extremities: No cyanosis or edema. Lymphatics: see Neck Exam Skin: No concerning lesions. Musculoskeletal: symmetric strength and muscle tone throughout. Neurologic: Cranial nerves II through XII are grossly intact. No obvious focalities. Speech is fluent. Coordination is intact. Psychiatric: Judgment and insight are intact. Affect is appropriate.   ECOG = 1  0 - Asymptomatic (Fully active, able to carry on all predisease activities without restriction)  1 - Symptomatic but completely ambulatory (Restricted in physically strenuous activity but ambulatory and able to carry out work of a light or sedentary nature. For example, light housework, office work)  2 - Symptomatic, <50% in bed during the day (Ambulatory and capable of all self care but unable to carry out any work activities. Up and about more than 50% of waking hours)  3 - Symptomatic, >50% in bed, but not bedbound (Capable of only limited self-care, confined to bed or chair 50% or more of waking hours)  4 - Bedbound (Completely disabled. Cannot carry on any self-care. Totally confined to bed or chair)  5 - Death   Eustace Pen MM, Creech RH, Tormey DC, et al. [phone]). "Toxicity and response criteria of the [facility] Group". [place] Oncol. 5 (6): 649-55   LABORATORY DATA:  Lab Results  Component Value Date   WBC 7.6 06/30/2007   HGB 13.7 06/30/2007   HCT 39.0 06/30/2007   MCV 89.7 06/30/2007   PLT 242 06/30/2007   CMP     Component Value Date/Time   NA 134* 06/30/2007 1432    K 3.5 06/30/2007 1432   CL 99 06/30/2007 1432   CO2 27 06/30/2007 1432   GLUCOSE 121* 06/30/2007 1432   BUN 13 06/30/2007 1432   CREATININE 0.68 06/30/2007 1432   CALCIUM 9.6 06/30/2007 1432   GFRNONAA >60 06/30/2007 1432   GFRAA  06/30/2007 1432    >60        The eGFR has been calculated using the MDRD equation. This calculation has not been validated in all clinical situations. eGFR's persistently <60 mL/min signify possible Chronic Kidney Disease.    No results found for: TSH     RADIOGRAPHY: Ct Soft Tissue Neck W Contrast  04/20/2014   CLINICAL DATA:  Right neck adenopathy.  EXAM: CT NECK WITH CONTRAST  TECHNIQUE: Multidetector CT imaging of the neck was performed using the standard protocol following the bolus administration of intravenous contrast.  CONTRAST:  33m OMNIPAQUE IOHEXOL 300 MG/ML  SOLN  COMPARISON:  CT orbits 11/01/2012  FINDINGS: The visualized portions of the  brain and orbits are unremarkable. The visualized paranasal sinuses and mastoid air cells are clear.  There is a large exophytic mass arising from the right tongue base protruding posteriorly into the oropharynx with effacement of much of the oropharyngeal airway. The mass measures approximately 4.0 x 3.5 x 2.8 cm. The mass contacts the tip of the epiglottis without evidence of direct involvement of the epiglottis. The mass contacts the posterior wall of the oropharynx. There is symmetric prominence of the palatine tonsillar soft tissues. Nasopharynx is unremarkable.  There is enlargement of the right lobe of the thyroid with a 1.8 cm hypoattenuating nodule noted. There is slight leftward tracheal deviation by the thyroid. The submandibular and parotid glands are unremarkable.  An enlarged right level IIB lymph node measures 10 mm in short axis (series 3, image 36). Additional subcentimeter right level IIB lymph nodes are present. 1.1 cm right level IIA lymph node is slightly larger than the corresponding left-sided  node. Immediately inferior to this is a 10 mm level II mildly enlarged lymph node with some maintenance of a fatty hilum. A large right level III lymph node measures 2.7 x 2.3 cm with heterogeneous enhancement suggestive of some small areas of possible necrosis. This corresponds to the palpable abnormality.  Minimal dependent atelectasis is present in the visualized lung apices. Mild cervical disc degeneration and facet arthrosis are noted. No suspicious lytic or blastic osseous lesions are identified depression of the floor of the right orbit is consistent with previously seen fracture which demonstrates interval healing.  IMPRESSION: 1. Large tongue base mass concerning for primary malignancy. 2. Metastatic right level II and III lymphadenopathy. 3. Right thyroid nodule. Further evaluation with thyroid ultrasound is recommended. These results will be called to the ordering clinician or representative by the Radiologist Assistant, and communication documented in the PACS or zVision Dashboard.   Electronically Signed   By: Logan Bores   On: 04/20/2014 15:20   US Soft Tissue Head/neck  05/03/2014   CLINICAL DATA:  Right thyroid nodule by CT.  EXAM: THYROID ULTRASOUND  TECHNIQUE: Ultrasound examination of the thyroid gland and adjacent soft tissues was performed.  COMPARISON:  04/20/2014  FINDINGS: Right thyroid lobe  Measurements: 5.7 x 2.4 x 3.1 cm. Diffusely heterogeneous mildly enlarged right thyroid lobe. Normal vascularity. Small solid well-circumscribed right lower pole nodule only measures 13 x 10 x 12 mm. No other significant focal right thyroid abnormality.  Left thyroid lobe  Measurements: 5.0 x 1.7 x 1.8 cm. Heterogeneous thyroid echotexture without a discrete nodule or mass.  Isthmus  Thickness: 7.5 mm.  No nodules visualized.  Lymphadenopathy  Abnormal right cervical adenopathy which correlates with the neck CT, concerning for metastatic right cervical nodes related to the posterior base of tongue  mass.  IMPRESSION: Heterogeneous asymmetric enlargement of the right thyroid lobe diffusely.  13 mm right lower pole incidental thyroid nodule.  Right cervical adenopathy, as above. This has already been biopsied by ENT.  Findings do not meet current SRU consensus criteria for biopsy. Follow-up by clinical exam is recommended. If patient has known risk factors for thyroid carcinoma, consider follow-up ultrasound in 12 months. If patient is clinically hyperthyroid, consider nuclear medicine thyroid uptake and scan.Reference: Management of Thyroid Nodules Detected at [place]: Society of Radiologists in [place]. Radiology 2005; [id number].   Electronically Signed   By: Daryll Brod M.D.   On: 05/03/2014 16:27   Nm Pet Image Initial (pi) Skull Base To Thigh  05/09/2014  CLINICAL DATA:  Initial treatment strategy for head neck cancer.  EXAM: NUCLEAR MEDICINE PET SKULL BASE TO THIGH  TECHNIQUE: 9.6 mCi F-18 FDG was injected intravenously. Full-ring PET imaging was performed from the skull base to thigh after the radiotracer. CT data was obtained and used for attenuation correction and anatomic localization.  FASTING BLOOD GLUCOSE:  Value: 147 mg/dl  COMPARISON:  CT neck 04/20/2014  FINDINGS: NECK  There is intense FDG uptake associated with the large tongue base mass. The mass measures approximately 3.9 x 3.8 cm and has an SUV max equal to 13.5. Hypermetabolic right level 2 cervical node measures 1.4 x 0.9 cm and has an SUV max equal to 2.3. Right-sided level 3 lymph node measures 2 x 2.3 cm and has an SUV max equal to 7.4. No hypermetabolic left cervical lymph nodes. Nodule within the right lobe of thyroid gland extending into the isthmus measures 2.8 cm and has an SUV max equal to 4.1.  CHEST  No hypermetabolic mediastinal or hilar nodes. No suspicious pulmonary nodules on the CT scan.  ABDOMEN/PELVIS  No abnormal hypermetabolic activity within the liver, pancreas, adrenal glands, or  spleen. Gallstone identified. No hypermetabolic lymph nodes in the abdomen or pelvis.  SKELETON  No focal hypermetabolic activity to suggest skeletal metastasis.  IMPRESSION: 1. There is intense FDG uptake associated with the large tongue base mass concerning for primary malignancy. 2. Malignant range FDG uptake is associated with the metastatic right level 2 and 3 lymph nodes. 3. No evidence for metastatic disease to the chest abdomen or pelvis.   Electronically Signed   By: Kerby Moors M.D.   On: 05/09/2014 10:35     IMPRESSION/PLAN:  This is a delightful 58 year old woman with stage IVA squamous cell carcinoma of the base of tongue. HPV status +, negative for ETOH abuse /  negative smoking history. The patient is a good candidate for radiotherapy. The patient has been discussed in detail at tumor board and seen in the context of multidisciplinary clinic today. Plan is as below:   1) The patient has met with med/onc to discuss chemotherapy - anticipate induction chemotherapy following by RT  2) Referral has been made to dentistry for dental evaluation/extractions in preparation for radiation in the vicinity of the mouth . Consult complete.  Decision on referral to an endodontist or oral surgeon will be made once final anticipated doses and ports are revealed by me to Dr. Enrique Sack. #31 is the tooth of concern.  I will let Dr. Celesta Gentile know that I believe this will be kept under 50Gy with IMRT.  3) Today in multidisciplinary clinic the patient will see Polo Riley from social work for social support  4) Today in multidisciplinary clinic she will see nutrition for nutrition support    5) Will refer to swallowing therapy for evaluation and prophylactic treatment as needed for dysphagia, which can worsen during or after radiotherapy.   6) Physical therapist will see patient today in [facility] clinic for neck measurements due to risk of lymphedema in neck; may benefit from PT for this after completion  of radiotherapy. The patient also may benefit from PT for potentional deconditioning after treatment    7) Simulation once cleared by medical oncology. Anticipate 7 weeks of RT - 70 Gy in 35 fractions.   8) Baseline TSH lab due to risk of hypothyroidism from RT   It was a pleasure meeting the patient today. We discussed the risks, benefits, and side effects of radiotherapy.  We talked in detail about acute and late effects. Shee understands that some of the most bothersome acute effects will be significant soreness of the mouth and throat, changes in taste, changes in salivary function, skin irritation, hair loss, dehydration, weight loss and fatigue. We talked about late effects which include but are not necessarily limited to dysphagia, hypothyroidism, dry mouth, trismus, neck edema. No guarantees of treatment were given.  The patient is enthusiastic about proceeding with treatment. I look forward to participating in the patient's care.   __________________________________________   Eppie Gibson, MD

## 2014-05-08 NOTE — Telephone Encounter (Signed)
[name] calling to confirm all appointments here at the cancer ctr for tomorrow, 05/09/14.

## 2014-05-08 NOTE — Telephone Encounter (Signed)
Spoke with patient, confirmed her 12:15 arrival for tomorrow's H&N MDC, arrival procedure to Radiation Oncology Waiting, answered her question re: arrival for PET tomorrow morning.  She indicated her husband and sister will be joining her tomorrow.  [name], RN, BSN, [place] [facility] [place] [phone]

## 2014-05-09 ENCOUNTER — Encounter: Payer: Self-pay | Admitting: Hematology and Oncology

## 2014-05-09 ENCOUNTER — Ambulatory Visit (HOSPITAL_COMMUNITY): Payer: Self-pay | Admitting: Dentistry

## 2014-05-09 ENCOUNTER — Ambulatory Visit (HOSPITAL_COMMUNITY)
Admission: RE | Admit: 2014-05-09 | Discharge: 2014-05-09 | Disposition: A | Discharge status: Home / Self Care | Payer: BC Managed Care – PPO | Source: Ambulatory Visit | Attending: Otolaryngology | Admitting: Otolaryngology

## 2014-05-09 ENCOUNTER — Ambulatory Visit (HOSPITAL_BASED_OUTPATIENT_CLINIC_OR_DEPARTMENT_OTHER): Payer: BC Managed Care – PPO | Admitting: Hematology and Oncology

## 2014-05-09 ENCOUNTER — Ambulatory Visit: Payer: BC Managed Care – PPO | Admitting: Nutrition

## 2014-05-09 ENCOUNTER — Encounter: Payer: Self-pay | Admitting: Radiation Oncology

## 2014-05-09 ENCOUNTER — Telehealth: Payer: Self-pay | Admitting: Hematology and Oncology

## 2014-05-09 ENCOUNTER — Encounter (HOSPITAL_COMMUNITY): Payer: Self-pay

## 2014-05-09 ENCOUNTER — Encounter: Payer: Self-pay | Admitting: *Deleted

## 2014-05-09 ENCOUNTER — Other Ambulatory Visit: Payer: Self-pay | Admitting: *Deleted

## 2014-05-09 ENCOUNTER — Encounter (HOSPITAL_COMMUNITY): Payer: Self-pay | Admitting: Dentistry

## 2014-05-09 ENCOUNTER — Ambulatory Visit
Admission: RE | Admit: 2014-05-09 | Discharge: 2014-05-09 | Disposition: A | Discharge status: Home / Self Care | Payer: BC Managed Care – PPO | Source: Ambulatory Visit | Attending: Radiation Oncology | Admitting: Radiation Oncology

## 2014-05-09 ENCOUNTER — Other Ambulatory Visit: Payer: Self-pay | Admitting: Hematology and Oncology

## 2014-05-09 ENCOUNTER — Ambulatory Visit: Payer: BC Managed Care – PPO | Attending: Radiation Oncology | Admitting: Physical Therapy

## 2014-05-09 VITALS — BP 146/60 | HR 87 | Temp 97.5°F

## 2014-05-09 VITALS — BP 134/56 | HR 95 | Temp 98.4°F | Resp 20 | Ht 67.5 in | Wt 200.1 lb

## 2014-05-09 DIAGNOSIS — I10 Essential (primary) hypertension: Secondary | ICD-10-CM | POA: Insufficient documentation

## 2014-05-09 DIAGNOSIS — M436 Torticollis: Secondary | ICD-10-CM | POA: Insufficient documentation

## 2014-05-09 DIAGNOSIS — C01 Malignant neoplasm of base of tongue: Secondary | ICD-10-CM | POA: Insufficient documentation

## 2014-05-09 DIAGNOSIS — E119 Type 2 diabetes mellitus without complications: Secondary | ICD-10-CM

## 2014-05-09 DIAGNOSIS — Z510 Encounter for antineoplastic radiation therapy: Secondary | ICD-10-CM | POA: Diagnosis present

## 2014-05-09 DIAGNOSIS — Z463 Encounter for fitting and adjustment of dental prosthetic device: Secondary | ICD-10-CM

## 2014-05-09 DIAGNOSIS — Z0189 Encounter for other specified special examinations: Secondary | ICD-10-CM

## 2014-05-09 DIAGNOSIS — C760 Malignant neoplasm of head, face and neck: Secondary | ICD-10-CM | POA: Diagnosis not present

## 2014-05-09 DIAGNOSIS — Z5189 Encounter for other specified aftercare: Secondary | ICD-10-CM | POA: Diagnosis present

## 2014-05-09 DIAGNOSIS — C770 Secondary and unspecified malignant neoplasm of lymph nodes of head, face and neck: Secondary | ICD-10-CM | POA: Diagnosis not present

## 2014-05-09 DIAGNOSIS — Z87891 Personal history of nicotine dependence: Secondary | ICD-10-CM | POA: Insufficient documentation

## 2014-05-09 DIAGNOSIS — E780 Pure hypercholesterolemia: Secondary | ICD-10-CM | POA: Diagnosis not present

## 2014-05-09 DIAGNOSIS — R590 Localized enlarged lymph nodes: Secondary | ICD-10-CM

## 2014-05-09 DIAGNOSIS — Z7982 Long term (current) use of aspirin: Secondary | ICD-10-CM | POA: Diagnosis not present

## 2014-05-09 DIAGNOSIS — D3705 Neoplasm of uncertain behavior of pharynx: Secondary | ICD-10-CM

## 2014-05-09 DIAGNOSIS — D3701 Neoplasm of uncertain behavior of lip: Secondary | ICD-10-CM

## 2014-05-09 DIAGNOSIS — D3709 Neoplasm of uncertain behavior of other specified sites of the oral cavity: Secondary | ICD-10-CM

## 2014-05-09 DIAGNOSIS — Z01818 Encounter for other preprocedural examination: Secondary | ICD-10-CM

## 2014-05-09 DIAGNOSIS — R634 Abnormal weight loss: Secondary | ICD-10-CM

## 2014-05-09 HISTORY — DX: Type 2 diabetes mellitus without complications: E11.9

## 2014-05-09 LAB — GLUCOSE, CAPILLARY: Glucose-Capillary: 147 mg/dL — ABNORMAL HIGH (ref 70–99)

## 2014-05-09 MED ORDER — SODIUM FLUORIDE 1.1 % DT GEL: DENTAL | Status: AC

## 2014-05-09 MED ORDER — DEXAMETHASONE 4 MG PO TABS: ORAL_TABLET | ORAL | Status: DC

## 2014-05-09 MED ORDER — ONDANSETRON HCL 8 MG PO TABS: 8.0000 mg | ORAL_TABLET | Freq: Three times a day (TID) | ORAL | Status: DC | PRN

## 2014-05-09 MED ORDER — FLUDEOXYGLUCOSE F - 18 (FDG) INJECTION
9.6000 | Freq: Once | INTRAVENOUS | Status: AC | PRN
  Administered 2014-05-09: 9.6 via INTRAVENOUS

## 2014-05-09 MED ORDER — PROCHLORPERAZINE MALEATE 10 MG PO TABS: 10.0000 mg | ORAL_TABLET | Freq: Four times a day (QID) | ORAL | Status: DC | PRN

## 2014-05-09 MED ORDER — LIDOCAINE-PRILOCAINE 2.5-2.5 % EX CREA: TOPICAL_CREAM | CUTANEOUS | Status: DC

## 2014-05-09 NOTE — Therapy (Signed)
Physical Therapy Evaluation  Patient Details  Name: [name] MRN: [id number] Date of Birth: Sep 05, 1955  Encounter Date: 05/09/2014      PT End of Session - 05/09/14 1516    Visit Number 1   Number of Visits 1   Date for PT Re-Evaluation 07/08/14   PT Start Time 1430   PT Stop Time 1500   PT Time Calculation (min) 30 min      Past Medical History  Diagnosis Date  . Hypertension   . Diabetes mellitus without complication     "borderline diabetic"   . Hypercholesteremia     Past Surgical History  Procedure Laterality Date  . Cervical ablation      Uterus  . Nasal fracture surgery  5621    Dr. Erik Obey    There were no vitals taken for this visit.  Visit Diagnosis:  Stiffness of neck - Plan: PT plan of care cert/re-cert      Subjective Assessment - 05/09/14 1503    Pertinent History Pt. with 6 week h/o feeling "like something was in her throat" and a lump in her neck presented 04/23/14 to Dr. Melida Quitter.  Diagnosed with malignant cells consistent with squamous cell carcinoma by fine needle aspiration.  CT showed several level II and II suspicious lymph nodes.   Currently in Pain? No/denies          [facility] PT Assessment - 05/09/14 0001    Assessment   Medical Diagnosis probably squamous cell carcinoma right base of tongue   Onset Date 04/23/14   Precautions   Precautions Other (comment)   Precaution Comments cancer precautions   Restrictions   Weight Bearing Restrictions No   Balance Screen   Has the patient fallen in the past 6 months No  [place] 2 years ago so is cautious   Has the patient had a decrease in activity level because of a fear of falling?  No   Is the patient reluctant to leave their home because of a fear of falling?  No   Home Environment   Living Enviornment Private residence   Living Arrangements Spouse/significant other   Home Layout One level   Prior Function   Level of [place] with basic ADLs   Observation/Other  Assessments   Observations Neck tissue is soft except for right side fullness   Functional Tests   Functional tests Sit to Stand   Sit to Stand   Comments 16 reps in 30 seconds   Posture/Postural Control   Posture/Postural Control Postural limitations   Postural Limitations Forward head   AROM   Overall AROM  Deficits   Overall AROM Comments neck limited 25% in extension and bilateral sidebend but others WFL; bilateral shoulders WFL   Ambulation/Gait   Ambulation/Gait Yes   Ambulation/Gait Assistance 7: Independent   Assistive device None            PT Education - 05/09/14 1515    Education provided Yes   Education Details neck ROM, posture, walking (or pedaling stationary bike), lymphedema info   Person(s) Educated Patient;Spouse;Other (comment)  sister present   Methods Explanation;Handout   Comprehension Verbalized understanding              Plan - 05/09/14 1517    Clinical Impression Statement Pt. with mild limitations in neck AROM currently; otherwise no immediate needs identified.   Pt will benefit from skilled therapeutic intervention in order to improve on the following deficits Decreased range of motion  Rehab Potential Excellent   PT Frequency One time visit   PT Treatment/Interventions Therapeutic exercise;Patient/family education   PT Next Visit Plan none at this time; may need therapy going forward if lymphedema develops or neck tightness worsens   Consulted and Agree with Plan of Care Patient        Problem List Patient Active Problem List   Diagnosis Date Noted  . Cancer of base of tongue 05/09/2014  . Neoplasm of uncertain behavior of oropharynx 05/04/2014  . Base of tongue mass 05/04/2014            LYMPHEDEMA/ONCOLOGY QUESTIONNAIRE - 05/09/14 1512    Type   Cancer Type probably squamous cell at right base of tongue   Lymphedema Assessments   Lymphedema Assessments Head and Neck   Head and Neck   4 cm superior to sternal notch  around neck 40 cm   6 cm superior to sternal notch around neck 39.5 cm   8 cm superior to sternal notch around neck 40.7 cm                                          Head and Neck Clinic Goals - 05/09/14 1520    Patient will be able to verbalize understanding of a home exercise program for cervical range of motion, posture, and walking.    Status Achieved   Patient will be able to verbalize understanding of proper sitting and standing posture.    Status Achieved   Patient will be able to verbalize understanding of lymphedema risk and availability of treatment for this condition.    Status Achieved        [place], PT 05/09/2014, 3:23 PM

## 2014-05-09 NOTE — Progress Notes (Signed)
PATIENT IS SEEN IN HEAD AND NECK CLINIC.  58 year old female diagnosed with cancer of the right base of the tongue.  She is a patient of Dr. Isidore Moos and Dr. Alvy Bimler.  Past medical history includes hypertension, hypercholesterolemia, and borderline diabetes mellitus.  Medications include Xanax, Lipitor, calcium with vitamin D, Glucophage, and vitamin C.  Labs were reviewed.  Height: 67.5 inches. Weight: 200.1 pounds. Usual body weight: 215 pounds BMI: 30.86.  Patient is to receive induction chemotherapy.  Plan is for feeding tube placement. Patient is anxious about treatment plan.  Denies current nutrition issues.  Nutrition Diagnosis:  Predicted suboptimal energy intake related to new diagnosis of tongue cancer as evidenced by history or presence of this condition for which research shows an increased incidence of suboptimal energy intake.  Intervention:  Educated to consume high-calorie, high-protein foods in small, frequent meals daily. Recommended patient avoid weight loss. Provided fact sheets on increasing calories and protein along with recipes and coupons for nutrition shakes. Encouraged patient to take nausea medications as prescribed and educated patient on strategies for eating if she develops nausea and vomiting.  Fact sheet provided. If patient loses greater than 5% usual body weight, will initiate tube feeding to supplement oral intake. Questions were answered.  Teach back method used.  Patient has my contact information.  Monitoring, evaluation, goals: Patient will tolerate adequate calories and protein for minimal weight loss throughout treatment.    Next visit: Friday, November 20, during chemotherapy.  **Disclaimer: This note was dictated with voice recognition software. Similar sounding words can inadvertently be transcribed and this note may contain transcription errors which may not have been corrected upon publication of note.**

## 2014-05-09 NOTE — Telephone Encounter (Signed)
lvm for pt regarding to All not appts....lvm for Tiffany in IR to sched picc placement.

## 2014-05-09 NOTE — Assessment & Plan Note (Addendum)
We discussed in great detail the rationale behind induction chemotherapy followed by radiation. With this strategy, it is hopeful that we can avoid placement of feeding tube and tracheostomy. Intention of treatment is curative. She would need placement of PICC line. I will try to expedite everything. She will need blood draw, placement of PICC line, chemotherapy education class, premedications, and to start chemotherapy in 2 days' time. I will see her next week to assess toxicity. The risks, benefit, side effects of chemotherapy using Taxotere, cisplatin and 5-FU is fully discussed with the patient and she agreed to proceed. She will receive further information from chemotherapy education class tomorrow. The patient is instructed to take premedication with dexamethasone start tomorrow. She is instructed to monitor her blood sugar carefully while on treatment.

## 2014-05-09 NOTE — Progress Notes (Signed)
05/09/2014  Patient Name:   [name] Date of Birth:   11-Mar-1956 Medical Record Number: [id number]  BP 146/60 mmHg  Pulse 87  Temp(Src) 97.5 F (36.4 C) (Oral)  [name] now presents for insertion of upper and lower fluoride trays and scatter protection devices.  PROCEDURE: Appliances were tried in and adjusted as needed. [place] Trismus device was previously fabricated 50 mm using 30 sticks. Postop instructions were provided and a written and verbal format concerning the use and care of appliances. All questions were answered. We will consider referral to an oral surgeon or proceed with dental extraction of lower right molar as indicated pending formal evaluation by medical and radiation oncology team. Patient was instructed to follow-up with her primary dentist for a dental cleaning, evaluation for root canal therapy of upper left and lower left premolars, and possible dental restoration of mandibular anterior tooth #25. Patient to call if questions or problems arise before then.  Lenn Cal, DDS

## 2014-05-09 NOTE — Progress Notes (Signed)
[facility] NOTE  Patient Care Team: Brooks Sailors, RN as Oncology Nurse [place], RD as Dietitian (Nutrition) Eppie Gibson, MD as Attending Physician (Radiation Oncology)  CHIEF COMPLAINTS/PURPOSE OF CONSULTATION:  Squamous cell carcinoma of the base of the tongue.  HISTORY OF PRESENTING ILLNESS:  [name] 58 y.o. female is here because of newly diagnosed squamous cell carcinoma of the tongue. Symptoms began approximately 2 months ago with increased secretion at the back of her mouth requiring her to spit out clear saliva. It has been going on for 6-8 weeks and she thought it was due to sinus drainage. She was seen by her his primary doctor and was prescribed over-the-counter allergy medications and antibiotics with no resolution. She also started to notice right neck swelling a month ago.  she denies any hearing deficit, difficulties with chewing food, swallowing difficulties, painful swallowing, changes in the quality of her voice or abnormal weight loss. I review her records intensively and discuss her case at tumor board this morning.   Cancer of base of tongue   04/20/2014 Imaging CT neck showed advanced base of tongue mass and multiple right neck lymphadenopathy   04/23/2014 Pathology Results [id number] FNA of right neck is positive for squamous cell cancer, P16 positive  PET/CT scan showed today showed no evidence of distant metastatic disease.  MEDICAL HISTORY:  Past Medical History  Diagnosis Date  . Hypertension   . Diabetes mellitus without complication     "borderline diabetic"   . Hypercholesteremia   . Diabetes mellitus type 2, controlled, without complications 86/76/1951    SURGICAL HISTORY: Past Surgical History  Procedure Laterality Date  . Cervical ablation      Uterus  . Nasal fracture surgery  [id number]    Dr. Erik Obey    SOCIAL HISTORY: History   Social History  . Marital Status: Married    Spouse Name: N/A     Number of Children: 1  . Years of Education: N/A   Occupational History  . Not on file.   Social History Main Topics  . Smoking status: Former Smoker -- 0.50 packs/day for 5 years    Types: Cigarettes    Quit date: 06/22/1974  . Smokeless tobacco: Never Used  . Alcohol Use: 0.0 oz/week    0 Not specified per week     Comment: occl wine  . Drug Use: No  . Sexual Activity: Not on file   Other Topics Concern  . Not on file   Social History Narrative    FAMILY HISTORY: Family History  Problem Relation Age of Onset  . Hepatitis C Mother   . Heart failure Father   . Cancer Sister     melanoma  . Cancer Brother     melanoma  . Cancer Paternal Grandfather     colon ca    ALLERGIES:  is allergic to penicillins.  MEDICATIONS:  Current Outpatient Prescriptions  Medication Sig Dispense Refill  . ALPRAZolam (XANAX) 0.25 MG tablet Take 0.25 mg by mouth as needed for anxiety.    Marland Kitchen amLODipine (NORVASC) 10 MG tablet Take 10 mg by mouth daily.    Marland Kitchen aspirin 81 MG tablet Take 81 mg by mouth daily.    Marland Kitchen atorvastatin (LIPITOR) 10 MG tablet Take 10 mg by mouth daily.    Marland Kitchen dexamethasone (DECADRON) 4 MG tablet Start the day before and the day after chemotherapy, 2 times a day every 3 weeks 30 tablet 1  .  lidocaine-prilocaine (EMLA) cream Apply to affected area once 30 g 3  . losartan-hydrochlorothiazide (HYZAAR) 100-12.5 MG per tablet Take 1 tablet by mouth daily.    . metFORMIN (GLUCOPHAGE) 500 MG tablet Take 500 mg by mouth daily with breakfast.    . ondansetron (ZOFRAN) 8 MG tablet Take 1 tablet (8 mg total) by mouth every 8 (eight) hours as needed (Nausea or vomiting). 30 tablet 1  . Prenatal Vit-Fe Fumarate-FA (MULTIVITAMIN-PRENATAL) 27-0.8 MG TABS tablet Take 1 tablet by mouth daily at 12 noon.    . prochlorperazine (COMPAZINE) 10 MG tablet Take 1 tablet (10 mg total) by mouth every 6 (six) hours as needed (Nausea or vomiting). 30 tablet 1  . sodium fluoride (FLUORISHIELD) 1.1 %  GEL dental gel Instill one drop of gel per tooth space of fluoride tray. Place over teeth for 5 minutes. Remove. Spit out excess. Repeat nightly. 120 mL prn   No current facility-administered medications for this visit.    REVIEW OF SYSTEMS:   Constitutional: Denies fevers, chills or abnormal night sweats Eyes: Denies blurriness of vision, double vision or watery eyes Ears, nose, mouth, throat, and face: Denies mucositis or sore throat Respiratory: Denies cough, dyspnea or wheezes Cardiovascular: Denies palpitation, chest discomfort or lower extremity swelling Gastrointestinal:  Denies nausea, heartburn or change in bowel habits Skin: Denies abnormal skin rashes Neurological:Denies numbness, tingling or new weaknesses Behavioral/Psych: Mood is stable, no new changes  All other systems were reviewed with the patient and are negative.  PHYSICAL EXAMINATION: ECOG PERFORMANCE STATUS: 0 - Asymptomatic GENERAL:alert, no distress and comfortable. She is mildly obese SKIN: skin color, texture, turgor are normal, no rashes or significant lesions EYES: normal, conjunctiva are pink and non-injected, sclera clear OROPHARYNX:no exudate, no erythema and lips, buccal mucosa, and tongue normal  NECK: supple, thyroid normal size, non-tender, without nodularity LYMPH:   palpable lymphadenopathy on the right side of the neck. LUNGS: clear to auscultation and percussion with normal breathing effort HEART: regular rate & rhythm and no murmurs and no lower extremity edema ABDOMEN:abdomen soft, non-tender and normal bowel sounds Musculoskeletal:no cyanosis of digits and no clubbing  PSYCH: alert & oriented x 3 with fluent speech NEURO: no focal motor/sensory deficits  LABORATORY DATA:  I have reviewed the data as listed Lab Results  Component Value Date   WBC 7.6 06/30/2007   HGB 13.7 06/30/2007   HCT 39.0 06/30/2007   MCV 89.7 06/30/2007   PLT 242 06/30/2007   Lab Results  Component Value Date    NA 134* 06/30/2007   K 3.5 06/30/2007   CL 99 06/30/2007   CO2 27 06/30/2007    RADIOGRAPHIC STUDIES: I have personally reviewed the radiological images as listed and agreed with the findings in the report. Ct Soft Tissue Neck W Contrast  04/20/2014   CLINICAL DATA:  Right neck adenopathy.  EXAM: CT NECK WITH CONTRAST  TECHNIQUE: Multidetector CT imaging of the neck was performed using the standard protocol following the bolus administration of intravenous contrast.  CONTRAST:  16mL OMNIPAQUE IOHEXOL 300 MG/ML  SOLN  COMPARISON:  CT orbits 11/01/2012  FINDINGS: The visualized portions of the brain and orbits are unremarkable. The visualized paranasal sinuses and mastoid air cells are clear.  There is a large exophytic mass arising from the right tongue base protruding posteriorly into the oropharynx with effacement of much of the oropharyngeal airway. The mass measures approximately 4.0 x 3.5 x 2.8 cm. The mass contacts the tip of the epiglottis without evidence  of direct involvement of the epiglottis. The mass contacts the posterior wall of the oropharynx. There is symmetric prominence of the palatine tonsillar soft tissues. Nasopharynx is unremarkable.  There is enlargement of the right lobe of the thyroid with a 1.8 cm hypoattenuating nodule noted. There is slight leftward tracheal deviation by the thyroid. The submandibular and parotid glands are unremarkable.  An enlarged right level IIB lymph node measures 10 mm in short axis (series 3, image 36). Additional subcentimeter right level IIB lymph nodes are present. 1.1 cm right level IIA lymph node is slightly larger than the corresponding left-sided node. Immediately inferior to this is a 10 mm level II mildly enlarged lymph node with some maintenance of a fatty hilum. A large right level III lymph node measures 2.7 x 2.3 cm with heterogeneous enhancement suggestive of some small areas of possible necrosis. This corresponds to the palpable  abnormality.  Minimal dependent atelectasis is present in the visualized lung apices. Mild cervical disc degeneration and facet arthrosis are noted. No suspicious lytic or blastic osseous lesions are identified depression of the floor of the right orbit is consistent with previously seen fracture which demonstrates interval healing.  IMPRESSION: 1. Large tongue base mass concerning for primary malignancy. 2. Metastatic right level II and III lymphadenopathy. 3. Right thyroid nodule. Further evaluation with thyroid ultrasound is recommended. These results will be called to the ordering clinician or representative by the Radiologist Assistant, and communication documented in the PACS or zVision Dashboard.   Electronically Signed   By: Logan Bores   On: 04/20/2014 15:20   US Soft Tissue Head/neck  05/03/2014   CLINICAL DATA:  Right thyroid nodule by CT.  EXAM: THYROID ULTRASOUND  TECHNIQUE: Ultrasound examination of the thyroid gland and adjacent soft tissues was performed.  COMPARISON:  04/20/2014  FINDINGS: Right thyroid lobe  Measurements: 5.7 x 2.4 x 3.1 cm. Diffusely heterogeneous mildly enlarged right thyroid lobe. Normal vascularity. Small solid well-circumscribed right lower pole nodule only measures 13 x 10 x 12 mm. No other significant focal right thyroid abnormality.  Left thyroid lobe  Measurements: 5.0 x 1.7 x 1.8 cm. Heterogeneous thyroid echotexture without a discrete nodule or mass.  Isthmus  Thickness: 7.5 mm.  No nodules visualized.  Lymphadenopathy  Abnormal right cervical adenopathy which correlates with the neck CT, concerning for metastatic right cervical nodes related to the posterior base of tongue mass.  IMPRESSION: Heterogeneous asymmetric enlargement of the right thyroid lobe diffusely.  13 mm right lower pole incidental thyroid nodule.  Right cervical adenopathy, as above. This has already been biopsied by ENT.  Findings do not meet current SRU consensus criteria for biopsy. Follow-up  by clinical exam is recommended. If patient has known risk factors for thyroid carcinoma, consider follow-up ultrasound in 12 months. If patient is clinically hyperthyroid, consider nuclear medicine thyroid uptake and scan.Reference: Management of Thyroid Nodules Detected at [place]: Society of Radiologists in [place]. Radiology 2005; [id number].   Electronically Signed   By: Daryll Brod M.D.   On: 05/03/2014 16:27   Nm Pet Image Initial (pi) Skull Base To Thigh  05/09/2014   CLINICAL DATA:  Initial treatment strategy for head neck cancer.  EXAM: NUCLEAR MEDICINE PET SKULL BASE TO THIGH  TECHNIQUE: 9.6 mCi F-18 FDG was injected intravenously. Full-ring PET imaging was performed from the skull base to thigh after the radiotracer. CT data was obtained and used for attenuation correction and anatomic localization.  FASTING BLOOD GLUCOSE:  Value:  147 mg/dl  COMPARISON:  CT neck 04/20/2014  FINDINGS: NECK  There is intense FDG uptake associated with the large tongue base mass. The mass measures approximately 3.9 x 3.8 cm and has an SUV max equal to 13.5. Hypermetabolic right level 2 cervical node measures 1.4 x 0.9 cm and has an SUV max equal to 2.3. Right-sided level 3 lymph node measures 2 x 2.3 cm and has an SUV max equal to 7.4. No hypermetabolic left cervical lymph nodes. Nodule within the right lobe of thyroid gland extending into the isthmus measures 2.8 cm and has an SUV max equal to 4.1.  CHEST  No hypermetabolic mediastinal or hilar nodes. No suspicious pulmonary nodules on the CT scan.  ABDOMEN/PELVIS  No abnormal hypermetabolic activity within the liver, pancreas, adrenal glands, or spleen. Gallstone identified. No hypermetabolic lymph nodes in the abdomen or pelvis.  SKELETON  No focal hypermetabolic activity to suggest skeletal metastasis.  IMPRESSION: 1. There is intense FDG uptake associated with the large tongue base mass concerning for primary malignancy. 2. Malignant  range FDG uptake is associated with the metastatic right level 2 and 3 lymph nodes. 3. No evidence for metastatic disease to the chest abdomen or pelvis.   Electronically Signed   By: Kerby Moors M.D.   On: 05/09/2014 10:35    ASSESSMENT & PLAN:  Cancer of base of tongue We discussed in great detail the rationale behind induction chemotherapy followed by radiation. With this strategy, it is hopeful that we can avoid placement of feeding tube and tracheostomy. Intention of treatment is curative. She would need placement of PICC line. I will try to expedite everything. She will need blood draw, placement of PICC line, chemotherapy education class, premedications, and to start chemotherapy in 2 days' time. I will see her next week to assess toxicity. The risks, benefit, side effects of chemotherapy using Taxotere, cisplatin and 5-FU is fully discussed with the patient and she agreed to proceed. She will receive further information from chemotherapy education class tomorrow. The patient is instructed to take premedication with dexamethasone start tomorrow. She is instructed to monitor her blood sugar carefully while on treatment.  Diabetes mellitus type 2, controlled, without complications She will continue metformin for now. The patient is instructed to monitor her blood sugar carefully.     Orders Placed This Encounter  Procedures  . CBC with Differential    Standing Status: Standing     Number of Occurrences: 20     Standing Expiration Date: 05/10/2015  . Comprehensive metabolic panel    Standing Status: Standing     Number of Occurrences: 20     Standing Expiration Date: 05/10/2015    All questions were answered. The patient knows to call the clinic with any problems, questions or concerns. I spent 55 minutes counseling the patient face to face. The total time spent in the appointment was 60 minutes and more than 50% was on counseling.     Kasson, La [place], MD 05/09/2014 4:10  PM

## 2014-05-09 NOTE — Patient Instructions (Signed)

## 2014-05-09 NOTE — Assessment & Plan Note (Signed)
She will continue metformin for now. The patient is instructed to monitor her blood sugar carefully.

## 2014-05-09 NOTE — Telephone Encounter (Signed)
Pt confirmed labs/ov all visits coming up per 11/18 POF, advised Shameeka lft msg concerning all visits pt is aware and went over the times with them all, gave pt AVS..... KJ

## 2014-05-10 ENCOUNTER — Other Ambulatory Visit (HOSPITAL_BASED_OUTPATIENT_CLINIC_OR_DEPARTMENT_OTHER): Payer: BC Managed Care – PPO

## 2014-05-10 ENCOUNTER — Encounter: Payer: Self-pay | Admitting: *Deleted

## 2014-05-10 ENCOUNTER — Other Ambulatory Visit: Payer: No Typology Code available for payment source

## 2014-05-10 ENCOUNTER — Other Ambulatory Visit: Payer: Self-pay | Admitting: Hematology and Oncology

## 2014-05-10 ENCOUNTER — Ambulatory Visit (HOSPITAL_COMMUNITY)
Admission: RE | Admit: 2014-05-10 | Discharge: 2014-05-10 | Disposition: A | Discharge status: Home / Self Care | Payer: BC Managed Care – PPO | Source: Ambulatory Visit | Attending: Hematology and Oncology | Admitting: Hematology and Oncology

## 2014-05-10 ENCOUNTER — Telehealth: Payer: Self-pay | Admitting: Radiation Oncology

## 2014-05-10 DIAGNOSIS — C01 Malignant neoplasm of base of tongue: Secondary | ICD-10-CM | POA: Diagnosis not present

## 2014-05-10 DIAGNOSIS — E119 Type 2 diabetes mellitus without complications: Secondary | ICD-10-CM

## 2014-05-10 LAB — CBC WITH DIFFERENTIAL/PLATELET
BASO%: 0.1 % (ref 0.0–2.0)
Basophils Absolute: 0 10*3/uL (ref 0.0–0.1)
EOS%: 0.4 % (ref 0.0–7.0)
Eosinophils Absolute: 0 10*3/uL (ref 0.0–0.5)
HCT: 36.4 % (ref 34.8–46.6)
HGB: 12.2 g/dL (ref 11.6–15.9)
LYMPH%: 14.9 % (ref 14.0–49.7)
MCH: 30.5 pg (ref 25.1–34.0)
MCHC: 33.5 g/dL (ref 31.5–36.0)
MCV: 91 fL (ref 79.5–101.0)
MONO#: 0.1 10*3/uL (ref 0.1–0.9)
MONO%: 1.8 % (ref 0.0–14.0)
NEUT#: 6.1 10*3/uL (ref 1.5–6.5)
NEUT%: 82.8 % — ABNORMAL HIGH (ref 38.4–76.8)
Platelets: 199 10*3/uL (ref 145–400)
RBC: 4 10*6/uL (ref 3.70–5.45)
RDW: 12.5 % (ref 11.2–14.5)
WBC: 7.3 10*3/uL (ref 3.9–10.3)
lymph#: 1.1 10*3/uL (ref 0.9–3.3)

## 2014-05-10 LAB — COMPREHENSIVE METABOLIC PANEL (CC13)
ALK PHOS: 106 U/L (ref 40–150)
ALT: 23 U/L (ref 0–55)
AST: 18 U/L (ref 5–34)
Albumin: 3.9 g/dL (ref 3.5–5.0)
Anion Gap: 7 mEq/L (ref 3–11)
BUN: 11 mg/dL (ref 7.0–26.0)
CO2: 29 mEq/L (ref 22–29)
Calcium: 9.9 mg/dL (ref 8.4–10.4)
Chloride: 103 mEq/L (ref 98–109)
Creatinine: 0.7 mg/dL (ref 0.6–1.1)
Glucose: 150 mg/dl — ABNORMAL HIGH (ref 70–140)
POTASSIUM: 3.7 meq/L (ref 3.5–5.1)
SODIUM: 139 meq/L (ref 136–145)
TOTAL PROTEIN: 7.9 g/dL (ref 6.4–8.3)
Total Bilirubin: 0.46 mg/dL (ref 0.20–1.20)

## 2014-05-10 MED ORDER — HEPARIN SOD (PORK) LOCK FLUSH 100 UNIT/ML IV SOLN
500.0000 [IU] | Freq: Once | INTRAVENOUS | Status: AC
  Administered 2014-05-10: 500 [IU]

## 2014-05-10 MED ORDER — HEPARIN SOD (PORK) LOCK FLUSH 100 UNIT/ML IV SOLN
INTRAVENOUS | Status: AC
  Filled 2014-05-10: qty 5

## 2014-05-10 MED ORDER — LIDOCAINE HCL 1 % IJ SOLN
INTRAMUSCULAR | Status: AC
  Filled 2014-05-10: qty 20

## 2014-05-10 NOTE — Telephone Encounter (Signed)
I called the home number and cell phone, but no answer. Left a voicemail to let [name] know of her upcoming appts for labs and [employer].

## 2014-05-10 NOTE — Discharge Instructions (Signed)
PICC Home Guide A peripherally inserted central catheter (PICC) is a long, thin, flexible tube that is inserted into a vein in the upper arm. It is a form of intravenous (IV) access. It is considered to be a "central" line because the tip of the PICC ends in a large vein in your chest. This large vein is called the superior vena cava (SVC). The PICC tip ends in the SVC because there is a lot of blood flow in the SVC. This allows medicines and IV fluids to be quickly distributed throughout the body. The PICC is inserted using a sterile technique by a specially trained nurse or physician. After the PICC is inserted, a chest X-ray exam is done to be sure it is in the correct place.  A PICC may be placed for different reasons, such as:  To give medicines and liquid nutrition that can only be given through a central line. Examples are:  Certain antibiotic treatments.  Chemotherapy.  Total parenteral nutrition (TPN).  To take frequent blood samples.  To give IV fluids and blood products.  If there is difficulty placing a peripheral intravenous (PIV) catheter. If taken care of properly, a PICC can remain in place for several months. A PICC can also allow a person to go home from the hospital early. Medicine and PICC care can be managed at home by a family member or home health care team. WHAT PROBLEMS CAN HAPPEN WHEN I HAVE A PICC? Problems with a PICC can occasionally occur. These may include the following:  A blood clot (thrombus) forming in or at the tip of the PICC. This can cause the PICC to become clogged. A clot-dissolving medicine called tissue plasminogen activator (tPA) can be given through the PICC to help break up the clot.  Inflammation of the vein (phlebitis) in which the PICC is placed. Signs of inflammation may include redness, pain at the insertion site, red streaks, or being able to feel a "cord" in the vein where the PICC is located.  Infection in the PICC or at the insertion  site. Signs of infection may include fever, chills, redness, swelling, or pus drainage from the PICC insertion site.  PICC movement (malposition). The PICC tip may move from its original position due to excessive physical activity, forceful coughing, sneezing, or vomiting.  A break or cut in the PICC. It is important to not use scissors near the PICC.  Nerve or tendon irritation or injury during PICC insertion. WHAT SHOULD I KEEP IN MIND ABOUT ACTIVITIES WHEN I HAVE A PICC?  You may bend your arm and move it freely. If your PICC is near or at the bend of your elbow, avoid activity with repeated motion at the elbow.  Rest at home for the remainder of the day following PICC line insertion.  Avoid lifting heavy objects as instructed by your health care provider.  Avoid using a crutch with the arm on the same side as your PICC. You may need to use a walker. WHAT SHOULD I KNOW ABOUT MY PICC DRESSING?  Keep your PICC bandage (dressing) clean and dry to prevent infection.  Ask your health care provider when you may shower. Ask your health care provider to teach you how to wrap the PICC when you do take a shower.  Change the PICC dressing as instructed by your health care provider.  Change your PICC dressing if it becomes loose or wet. WHAT SHOULD I KNOW ABOUT PICC CARE?  Check the PICC insertion site   daily for leakage, redness, swelling, or pain.  Do not take a bath, swim, or use hot tubs when you have a PICC. Cover PICC line with clear plastic wrap and tape to keep it dry while showering.  Flush the PICC as directed by your health care provider. Let your health care provider know right away if the PICC is difficult to flush or does not flush. Do not use force to flush the PICC.  Do not use a syringe that is less than 10 mL to flush the PICC.  Never pull or tug on the PICC.  Avoid blood pressure checks on the arm with the PICC.  Keep your PICC identification card with you at all  times.  Do not take the PICC out yourself. Only a trained clinical professional should remove the PICC. SEEK IMMEDIATE MEDICAL CARE IF:  Your PICC is accidentally pulled all the way out. If this happens, cover the insertion site with a bandage or gauze dressing. Do not throw the PICC away. Your health care provider will need to inspect it.  Your PICC was tugged or pulled and has partially come out. Do not  push the PICC back in.  There is any type of drainage, redness, or swelling where the PICC enters the skin.  You cannot flush the PICC, it is difficult to flush, or the PICC leaks around the insertion site when it is flushed.  You hear a "flushing" sound when the PICC is flushed.  You have pain, discomfort, or numbness in your arm, shoulder, or jaw on the same side as the PICC.  You feel your heart "racing" or skipping beats.  You notice a hole or tear in the PICC.  You develop chills or a fever. MAKE SURE YOU:   Understand these instructions.  Will watch your condition.  Will get help right away if you are not doing well or get worse. Document Released: 12/13/2002 Document Revised: 10/23/2013 Document Reviewed: 02/13/2013 ExitCare Patient Information 2015 [employer]. This information is not intended to replace advice given to you by your health care provider. Make sure you discuss any questions you have with your health care provider.  

## 2014-05-10 NOTE — Progress Notes (Signed)
Head & Neck Multidisciplinary Clinic Clinical Social Work  Clinical Social Work met with patient/family and radiation oncologist at head & neck multidisciplinary clinic to offer support and assess for psychosocial needs.  Radiation oncologist reviewed patient's diagnosis and recommended treatment plan with patient/family.  [name] was accompanied by her spouse and sister.  [name] reports her main concern at this time is beginning treatment and feeling overwhelmed/anxious.  [name] shared she feels less anxious after meeting with Dr. Alvy Bimler and Dr. Isidore Moos.  Patient completed distress screen prior to medical visits- she repots understanding information on diagnosis and treatment after meeting with physicians.    ONCBCN DISTRESS SCREENING 05/09/2014  Screening Type Initial Screening  Distress experienced in past week (1-10) 8  Emotional problem type Nervousness/Anxiety;Adjusting to illness  Information Concerns Type Lack of info about diagnosis;Lack of info about treatment;Lack of info about complementary therapy choices  Physician notified of physical symptoms Yes  Referral to clinical social work Yes   Clinical Social Work briefly discussed Clinical Social Work role and [employer] support programs/services.  Clinical Social Work encouraged patient to call with any additional questions or concerns.   Polo Riley, MSW, LCSW, OSW-C Clinical Social Worker [facility] [phone]

## 2014-05-10 NOTE — Procedures (Signed)
US/fluoroscopic guided right DL basilic vein PICC placed. Length 40 cm. Tip SVC/RA junction. No immediate complications. Ok to use.

## 2014-05-11 ENCOUNTER — Encounter: Payer: Self-pay | Admitting: *Deleted

## 2014-05-11 ENCOUNTER — Ambulatory Visit: Payer: BC Managed Care – PPO | Admitting: Nutrition

## 2014-05-11 ENCOUNTER — Ambulatory Visit: Payer: BC Managed Care – PPO

## 2014-05-11 ENCOUNTER — Ambulatory Visit: Payer: BC Managed Care – PPO | Admitting: Radiation Oncology

## 2014-05-11 ENCOUNTER — Ambulatory Visit (HOSPITAL_BASED_OUTPATIENT_CLINIC_OR_DEPARTMENT_OTHER): Payer: BC Managed Care – PPO

## 2014-05-11 DIAGNOSIS — C01 Malignant neoplasm of base of tongue: Secondary | ICD-10-CM

## 2014-05-11 DIAGNOSIS — Z5111 Encounter for antineoplastic chemotherapy: Secondary | ICD-10-CM

## 2014-05-11 MED ORDER — SODIUM CHLORIDE 0.9 % IJ SOLN
10.0000 mL | INTRAMUSCULAR | Status: DC | PRN
  Administered 2014-05-11: 10 mL
  Filled 2014-05-11: qty 10

## 2014-05-11 MED ORDER — SODIUM CHLORIDE 0.9 % IV SOLN
75.0000 mg/m2 | Freq: Once | INTRAVENOUS | Status: AC
  Administered 2014-05-11: 161 mg via INTRAVENOUS
  Filled 2014-05-11: qty 161

## 2014-05-11 MED ORDER — HEPARIN SOD (PORK) LOCK FLUSH 100 UNIT/ML IV SOLN
500.0000 [IU] | Freq: Once | INTRAVENOUS | Status: AC | PRN
  Administered 2014-05-11: 500 [IU]
  Filled 2014-05-11: qty 5

## 2014-05-11 MED ORDER — DOCETAXEL CHEMO INJECTION 160 MG/16ML
75.0000 mg/m2 | Freq: Once | INTRAVENOUS | Status: AC
  Administered 2014-05-11: 160 mg via INTRAVENOUS
  Filled 2014-05-11: qty 16

## 2014-05-11 MED ORDER — SODIUM CHLORIDE 0.9 % IV SOLN
150.0000 mg | Freq: Once | INTRAVENOUS | Status: AC
  Administered 2014-05-11: 150 mg via INTRAVENOUS
  Filled 2014-05-11: qty 5

## 2014-05-11 MED ORDER — SODIUM CHLORIDE 0.9 % IV SOLN
750.0000 mg/m2/d | INTRAVENOUS | Status: DC
  Administered 2014-05-11: 8050 mg via INTRAVENOUS
  Filled 2014-05-11: qty 161

## 2014-05-11 MED ORDER — PALONOSETRON HCL INJECTION 0.25 MG/5ML
INTRAVENOUS | Status: AC
  Filled 2014-05-11: qty 5

## 2014-05-11 MED ORDER — POTASSIUM CHLORIDE 2 MEQ/ML IV SOLN
Freq: Once | INTRAVENOUS | Status: AC
  Administered 2014-05-11: 08:00:00 via INTRAVENOUS
  Filled 2014-05-11: qty 10

## 2014-05-11 MED ORDER — SODIUM CHLORIDE 0.9 % IV SOLN
Freq: Once | INTRAVENOUS | Status: AC
  Administered 2014-05-11: 08:00:00 via INTRAVENOUS

## 2014-05-11 MED ORDER — PALONOSETRON HCL INJECTION 0.25 MG/5ML
0.2500 mg | Freq: Once | INTRAVENOUS | Status: AC
  Administered 2014-05-11: 0.25 mg via INTRAVENOUS

## 2014-05-11 MED ORDER — DEXAMETHASONE SODIUM PHOSPHATE 20 MG/5ML IJ SOLN
INTRAMUSCULAR | Status: AC
  Filled 2014-05-11: qty 5

## 2014-05-11 MED ORDER — DEXAMETHASONE SODIUM PHOSPHATE 20 MG/5ML IJ SOLN
12.0000 mg | Freq: Once | INTRAMUSCULAR | Status: AC
  Administered 2014-05-11: 12 mg via INTRAVENOUS

## 2014-05-11 NOTE — Patient Instructions (Addendum)
[facility] Discharge Instructions for Patients Receiving Chemotherapy  Today you received the following chemotherapy agents Docetaxel/Cisplatin/5-FU.  To help prevent nausea and vomiting after your treatment, we encourage you to take your nausea medication as directed.    If you develop nausea and vomiting that is not controlled by your nausea medication, call the clinic.   BELOW ARE SYMPTOMS THAT SHOULD BE REPORTED IMMEDIATELY:  *FEVER GREATER THAN 100.5 F  *CHILLS WITH OR WITHOUT FEVER  NAUSEA AND VOMITING THAT IS NOT CONTROLLED WITH YOUR NAUSEA MEDICATION  *UNUSUAL SHORTNESS OF BREATH  *UNUSUAL BRUISING OR BLEEDING  TENDERNESS IN MOUTH AND THROAT WITH OR WITHOUT PRESENCE OF ULCERS  *URINARY PROBLEMS  *BOWEL PROBLEMS  UNUSUAL RASH Items with * indicate a potential emergency and should be followed up as soon as possible.  Feel free to call the clinic you have any questions or concerns. The clinic phone number is [phone].   Docetaxel injection What is this medicine? DOCETAXEL (doe se TAX el) is a chemotherapy drug. It targets fast dividing cells, like cancer cells, and causes these cells to die. This medicine is used to treat many types of cancers like breast cancer, certain stomach cancers, head and neck cancer, lung cancer, and prostate cancer. This medicine may be used for other purposes; ask your health care provider or pharmacist if you have questions. COMMON BRAND NAME(S): Docefrez, Taxotere What should I tell my health care provider before I take this medicine? They need to know if you have any of these conditions: -infection (especially a virus infection such as chickenpox, cold sores, or herpes) -liver disease -low blood counts, like low white cell, platelet, or red cell counts -an unusual or allergic reaction to docetaxel, polysorbate 80, other chemotherapy agents, other medicines, foods, dyes, or preservatives -pregnant or trying to get  pregnant -breast-feeding How should I use this medicine? This drug is given as an infusion into a vein. It is administered in a hospital or clinic by a specially trained health care professional. Talk to your pediatrician regarding the use of this medicine in children. Special care may be needed. Overdosage: If you think you have taken too much of this medicine contact a poison control center or emergency room at once. NOTE: This medicine is only for you. Do not share this medicine with others. What if I miss a dose? It is important not to miss your dose. Call your doctor or health care professional if you are unable to keep an appointment. What may interact with this medicine? -cyclosporine -erythromycin -ketoconazole -medicines to increase blood counts like filgrastim, pegfilgrastim, sargramostim -vaccines Talk to your doctor or health care professional before taking any of these medicines: -acetaminophen -aspirin -ibuprofen -ketoprofen -naproxen This list may not describe all possible interactions. Give your health care provider a list of all the medicines, herbs, non-prescription drugs, or dietary supplements you use. Also tell them if you smoke, drink alcohol, or use illegal drugs. Some items may interact with your medicine. What should I watch for while using this medicine? Your condition will be monitored carefully while you are receiving this medicine. You will need important blood work done while you are taking this medicine. This drug may make you feel generally unwell. This is not uncommon, as chemotherapy can affect healthy cells as well as cancer cells. Report any side effects. Continue your course of treatment even though you feel ill unless your doctor tells you to stop. In some cases, you may be given additional medicines  to help with side effects. Follow all directions for their use. Call your doctor or health care professional for advice if you get a fever, chills or sore  throat, or other symptoms of a cold or flu. Do not treat yourself. This drug decreases your body's ability to fight infections. Try to avoid being around people who are sick. This medicine may increase your risk to bruise or bleed. Call your doctor or health care professional if you notice any unusual bleeding. Be careful brushing and flossing your teeth or using a toothpick because you may get an infection or bleed more easily. If you have any dental work done, tell your dentist you are receiving this medicine. Avoid taking products that contain aspirin, acetaminophen, ibuprofen, naproxen, or ketoprofen unless instructed by your doctor. These medicines may hide a fever. This medicine contains an alcohol in the product. You may get drowsy or dizzy. Do not drive, use machinery, or do anything that needs mental alertness until you know how this medicine affects you. Do not stand or sit up quickly, especially if you are an older patient. This reduces the risk of dizzy or fainting spells. Avoid alcoholic drinks Do not become pregnant while taking this medicine. Women should inform their doctor if they wish to become pregnant or think they might be pregnant. There is a potential for serious side effects to an unborn child. Talk to your health care professional or pharmacist for more information. Do not breast-feed an infant while taking this medicine. What side effects may I notice from receiving this medicine? Side effects that you should report to your doctor or health care professional as soon as possible: -allergic reactions like skin rash, itching or hives, swelling of the face, lips, or tongue -low blood counts - This drug may decrease the number of white blood cells, red blood cells and platelets. You may be at increased risk for infections and bleeding. -signs of infection - fever or chills, cough, sore throat, pain or difficulty passing urine -signs of decreased platelets or bleeding - bruising,  pinpoint red spots on the skin, black, tarry stools, nosebleeds -signs of decreased red blood cells - unusually weak or tired, fainting spells, lightheadedness -breathing problems -fast or irregular heartbeat -low blood pressure -mouth sores -nausea and vomiting -pain, swelling, redness or irritation at the injection site -pain, tingling, numbness in the hands or feet -swelling of the ankle, feet, hands -weight gain Side effects that usually do not require medical attention (report to your prescriber or health care professional if they continue or are bothersome): -bone pain -complete hair loss including hair on your head, underarms, pubic hair, eyebrows, and eyelashes -diarrhea -excessive tearing -changes in the color of fingernails -loosening of the fingernails -nausea -muscle pain -red flush to skin -sweating -weak or tired This list may not describe all possible side effects. Call your doctor for medical advice about side effects. You may report side effects to FDA at 1-800-FDA-1088. Where should I keep my medicine? This drug is given in a hospital or clinic and will not be stored at home. NOTE: This sheet is a summary. It may not cover all possible information. If you have questions about this medicine, talk to your doctor, pharmacist, or health care provider.  2015, Elsevier/[employer] Standard. (2013-05-04 22:21:02)  Cisplatin injection What is this medicine? CISPLATIN (SIS pla tin) is a chemotherapy drug. It targets fast dividing cells, like cancer cells, and causes these cells to die. This medicine is used to treat many types  of cancer like bladder, ovarian, and testicular cancers. This medicine may be used for other purposes; ask your health care provider or pharmacist if you have questions. COMMON BRAND NAME(S): Platinol, Platinol -AQ What should I tell my health care provider before I take this medicine? They need to know if you have any of these conditions: -blood  disorders -hearing problems -kidney disease -recent or ongoing radiation therapy -an unusual or allergic reaction to cisplatin, carboplatin, other chemotherapy, other medicines, foods, dyes, or preservatives -pregnant or trying to get pregnant -breast-feeding How should I use this medicine? This drug is given as an infusion into a vein. It is administered in a hospital or clinic by a specially trained health care professional. Talk to your pediatrician regarding the use of this medicine in children. Special care may be needed. Overdosage: If you think you have taken too much of this medicine contact a poison control center or emergency room at once. NOTE: This medicine is only for you. Do not share this medicine with others. What if I miss a dose? It is important not to miss a dose. Call your doctor or health care professional if you are unable to keep an appointment. What may interact with this medicine? -dofetilide -foscarnet -medicines for seizures -medicines to increase blood counts like filgrastim, pegfilgrastim, sargramostim -probenecid -pyridoxine used with altretamine -rituximab -some antibiotics like amikacin, gentamicin, neomycin, polymyxin B, streptomycin, tobramycin -sulfinpyrazone -vaccines -zalcitabine Talk to your doctor or health care professional before taking any of these medicines: -acetaminophen -aspirin -ibuprofen -ketoprofen -naproxen This list may not describe all possible interactions. Give your health care provider a list of all the medicines, herbs, non-prescription drugs, or dietary supplements you use. Also tell them if you smoke, drink alcohol, or use illegal drugs. Some items may interact with your medicine. What should I watch for while using this medicine? Your condition will be monitored carefully while you are receiving this medicine. You will need important blood work done while you are taking this medicine. This drug may make you feel generally  unwell. This is not uncommon, as chemotherapy can affect healthy cells as well as cancer cells. Report any side effects. Continue your course of treatment even though you feel ill unless your doctor tells you to stop. In some cases, you may be given additional medicines to help with side effects. Follow all directions for their use. Call your doctor or health care professional for advice if you get a fever, chills or sore throat, or other symptoms of a cold or flu. Do not treat yourself. This drug decreases your body's ability to fight infections. Try to avoid being around people who are sick. This medicine may increase your risk to bruise or bleed. Call your doctor or health care professional if you notice any unusual bleeding. Be careful brushing and flossing your teeth or using a toothpick because you may get an infection or bleed more easily. If you have any dental work done, tell your dentist you are receiving this medicine. Avoid taking products that contain aspirin, acetaminophen, ibuprofen, naproxen, or ketoprofen unless instructed by your doctor. These medicines may hide a fever. Do not become pregnant while taking this medicine. Women should inform their doctor if they wish to become pregnant or think they might be pregnant. There is a potential for serious side effects to an unborn child. Talk to your health care professional or pharmacist for more information. Do not breast-feed an infant while taking this medicine. Drink fluids as directed while you  are taking this medicine. This will help protect your kidneys. Call your doctor or health care professional if you get diarrhea. Do not treat yourself. What side effects may I notice from receiving this medicine? Side effects that you should report to your doctor or health care professional as soon as possible: -allergic reactions like skin rash, itching or hives, swelling of the face, lips, or tongue -signs of infection - fever or chills, cough,  sore throat, pain or difficulty passing urine -signs of decreased platelets or bleeding - bruising, pinpoint red spots on the skin, black, tarry stools, nosebleeds -signs of decreased red blood cells - unusually weak or tired, fainting spells, lightheadedness -breathing problems -changes in hearing -gout pain -low blood counts - This drug may decrease the number of white blood cells, red blood cells and platelets. You may be at increased risk for infections and bleeding. -nausea and vomiting -pain, swelling, redness or irritation at the injection site -pain, tingling, numbness in the hands or feet -problems with balance, movement -trouble passing urine or change in the amount of urine Side effects that usually do not require medical attention (report to your doctor or health care professional if they continue or are bothersome): -changes in vision -loss of appetite -metallic taste in the mouth or changes in taste This list may not describe all possible side effects. Call your doctor for medical advice about side effects. You may report side effects to FDA at 1-800-FDA-1088. Where should I keep my medicine? This drug is given in a hospital or clinic and will not be stored at home. NOTE: This sheet is a summary. It may not cover all possible information. If you have questions about this medicine, talk to your doctor, pharmacist, or health care provider.  2015, Elsevier/Gold Standard. (2007-09-13 14:40:54)   Fluorouracil, 5-FU injection What is this medicine? FLUOROURACIL, 5-FU (flure oh YOOR a sil) is a chemotherapy drug. It slows the growth of cancer cells. This medicine is used to treat many types of cancer like breast cancer, colon or rectal cancer, pancreatic cancer, and stomach cancer. This medicine may be used for other purposes; ask your health care provider or pharmacist if you have questions. COMMON BRAND NAME(S): Adrucil What should I tell my health care provider before I take  this medicine? They need to know if you have any of these conditions: -blood disorders -dihydropyrimidine dehydrogenase (DPD) deficiency -infection (especially a virus infection such as chickenpox, cold sores, or herpes) -kidney disease -liver disease -malnourished, poor nutrition -recent or ongoing radiation therapy -an unusual or allergic reaction to fluorouracil, other chemotherapy, other medicines, foods, dyes, or preservatives -pregnant or trying to get pregnant -breast-feeding How should I use this medicine? This drug is given as an infusion or injection into a vein. It is administered in a hospital or clinic by a specially trained health care professional. Talk to your pediatrician regarding the use of this medicine in children. Special care may be needed. Overdosage: If you think you have taken too much of this medicine contact a poison control center or emergency room at once. NOTE: This medicine is only for you. Do not share this medicine with others. What if I miss a dose? It is important not to miss your dose. Call your doctor or health care professional if you are unable to keep an appointment. What may interact with this medicine? -allopurinol -cimetidine -dapsone -digoxin -hydroxyurea -leucovorin -levamisole -medicines for seizures like ethotoin, fosphenytoin, phenytoin -medicines to increase blood counts like filgrastim, pegfilgrastim, sargramostim -  medicines that treat or prevent blood clots like warfarin, enoxaparin, and dalteparin -methotrexate -metronidazole -pyrimethamine -some other chemotherapy drugs like busulfan, cisplatin, estramustine, vinblastine -trimethoprim -trimetrexate -vaccines Talk to your doctor or health care professional before taking any of these medicines: -acetaminophen -aspirin -ibuprofen -ketoprofen -naproxen This list may not describe all possible interactions. Give your health care provider a list of all the medicines, herbs,  non-prescription drugs, or dietary supplements you use. Also tell them if you smoke, drink alcohol, or use illegal drugs. Some items may interact with your medicine. What should I watch for while using this medicine? Visit your doctor for checks on your progress. This drug may make you feel generally unwell. This is not uncommon, as chemotherapy can affect healthy cells as well as cancer cells. Report any side effects. Continue your course of treatment even though you feel ill unless your doctor tells you to stop. In some cases, you may be given additional medicines to help with side effects. Follow all directions for their use. Call your doctor or health care professional for advice if you get a fever, chills or sore throat, or other symptoms of a cold or flu. Do not treat yourself. This drug decreases your body's ability to fight infections. Try to avoid being around people who are sick. This medicine may increase your risk to bruise or bleed. Call your doctor or health care professional if you notice any unusual bleeding. Be careful brushing and flossing your teeth or using a toothpick because you may get an infection or bleed more easily. If you have any dental work done, tell your dentist you are receiving this medicine. Avoid taking products that contain aspirin, acetaminophen, ibuprofen, naproxen, or ketoprofen unless instructed by your doctor. These medicines may hide a fever. Do not become pregnant while taking this medicine. Women should inform their doctor if they wish to become pregnant or think they might be pregnant. There is a potential for serious side effects to an unborn child. Talk to your health care professional or pharmacist for more information. Do not breast-feed an infant while taking this medicine. Men should inform their doctor if they wish to father a child. This medicine may lower sperm counts. Do not treat diarrhea with over the counter products. Contact your doctor if you  have diarrhea that lasts more than 2 days or if it is severe and watery. This medicine can make you more sensitive to the sun. Keep out of the sun. If you cannot avoid being in the sun, wear protective clothing and use sunscreen. Do not use sun lamps or tanning beds/booths. What side effects may I notice from receiving this medicine? Side effects that you should report to your doctor or health care professional as soon as possible: -allergic reactions like skin rash, itching or hives, swelling of the face, lips, or tongue -low blood counts - this medicine may decrease the number of white blood cells, red blood cells and platelets. You may be at increased risk for infections and bleeding. -signs of infection - fever or chills, cough, sore throat, pain or difficulty passing urine -signs of decreased platelets or bleeding - bruising, pinpoint red spots on the skin, black, tarry stools, blood in the urine -signs of decreased red blood cells - unusually weak or tired, fainting spells, lightheadedness -breathing problems -changes in vision -chest pain -mouth sores -nausea and vomiting -pain, swelling, redness at site where injected -pain, tingling, numbness in the hands or feet -redness, swelling, or sores on hands or  feet -stomach pain -unusual bleeding Side effects that usually do not require medical attention (report to your doctor or health care professional if they continue or are bothersome): -changes in finger or toe nails -diarrhea -dry or itchy skin -hair loss -headache -loss of appetite -sensitivity of eyes to the light -stomach upset -unusually teary eyes This list may not describe all possible side effects. Call your doctor for medical advice about side effects. You may report side effects to FDA at 1-800-FDA-1088. Where should I keep my medicine? This drug is given in a hospital or clinic and will not be stored at home. NOTE: This sheet is a summary. It may not cover all  possible information. If you have questions about this medicine, talk to your doctor, pharmacist, or health care provider.  2015, [employer]vier/[employer] Standard. (2007-10-12 13:53:16)

## 2014-05-11 NOTE — Progress Notes (Signed)
Brief nutrition follow up with patient and husband, during first chemotherapy. Patient has no questions or concerns today regarding nutrition. She is understandably anxious about nausea and vomiting with chemotherapy. Pharmacist has reviewed nausea medications. Will continue to follow patient a regular basis for adequate nutrition.  **Disclaimer: This note was dictated with voice recognition software. Similar sounding words can inadvertently be transcribed and this note may contain transcription errors which may not have been corrected upon publication of note.**

## 2014-05-11 NOTE — Progress Notes (Unsigned)
Met with patient and her family during [place] to provide support, encouragement, care continuity and to assess for needs.  Provided New Patient Information packet: 1. Contact information for physicians and myself,  2. Advance Directive information ([place] blue pamphlet) 3. Fall Prevention Patient Safety Plan 4. [facility] with highlight of [place] 5.

## 2014-05-11 NOTE — Progress Notes (Signed)
To provide support and encouragement, care continuity and to assess for needs, met with patient and her husband prior to and later during her 1st infusion.  She reported that she is tolerating procedure well.  She denied concerns or needs at this time, understands she can contact me should that change.  [name], RN, BSN, [place] [facility] [place] [phone]

## 2014-05-14 ENCOUNTER — Telehealth: Payer: Self-pay | Admitting: *Deleted

## 2014-05-14 NOTE — Telephone Encounter (Signed)
CALLED PATIENT TO INFORM OF LAB, AND APPT. WITH CARL Regal ON 05-28-14, SPOKE WITH PATIENT AND SHE IS AWARE OF THESE APPTS.

## 2014-05-14 NOTE — Telephone Encounter (Signed)
Eating well, no nausea with antiemetics. No mouth sores. Feels sluggish, but still riding her exercise bike daily. PICC site feels/looks OK to her. Instructed her to call for any questions or concerns.

## 2014-05-16 ENCOUNTER — Ambulatory Visit (HOSPITAL_BASED_OUTPATIENT_CLINIC_OR_DEPARTMENT_OTHER): Payer: BC Managed Care – PPO

## 2014-05-16 ENCOUNTER — Ambulatory Visit (HOSPITAL_BASED_OUTPATIENT_CLINIC_OR_DEPARTMENT_OTHER): Payer: BC Managed Care – PPO | Admitting: Hematology and Oncology

## 2014-05-16 ENCOUNTER — Telehealth: Payer: Self-pay | Admitting: *Deleted

## 2014-05-16 ENCOUNTER — Encounter: Payer: Self-pay | Admitting: Hematology and Oncology

## 2014-05-16 ENCOUNTER — Encounter: Payer: Self-pay | Admitting: *Deleted

## 2014-05-16 ENCOUNTER — Telehealth: Payer: Self-pay | Admitting: Hematology and Oncology

## 2014-05-16 VITALS — BP 119/65 | HR 114 | Temp 98.3°F | Resp 18 | Ht 67.5 in | Wt 197.4 lb

## 2014-05-16 DIAGNOSIS — Z9889 Other specified postprocedural states: Secondary | ICD-10-CM

## 2014-05-16 DIAGNOSIS — K5909 Other constipation: Secondary | ICD-10-CM

## 2014-05-16 DIAGNOSIS — E119 Type 2 diabetes mellitus without complications: Secondary | ICD-10-CM

## 2014-05-16 DIAGNOSIS — C01 Malignant neoplasm of base of tongue: Secondary | ICD-10-CM

## 2014-05-16 DIAGNOSIS — Z95828 Presence of other vascular implants and grafts: Secondary | ICD-10-CM

## 2014-05-16 DIAGNOSIS — K5900 Constipation, unspecified: Secondary | ICD-10-CM | POA: Insufficient documentation

## 2014-05-16 DIAGNOSIS — Z5189 Encounter for other specified aftercare: Secondary | ICD-10-CM

## 2014-05-16 MED ORDER — SODIUM CHLORIDE 0.9 % IJ SOLN
10.0000 mL | INTRAMUSCULAR | Status: DC | PRN
  Administered 2014-05-16: 10 mL
  Filled 2014-05-16: qty 10

## 2014-05-16 MED ORDER — HEPARIN SOD (PORK) LOCK FLUSH 100 UNIT/ML IV SOLN
250.0000 [IU] | Freq: Once | INTRAVENOUS | Status: AC | PRN
  Administered 2014-05-16: 250 [IU]
  Filled 2014-05-16: qty 5

## 2014-05-16 MED ORDER — PEGFILGRASTIM INJECTION 6 MG/0.6ML ~~LOC~~
6.0000 mg | PREFILLED_SYRINGE | Freq: Once | SUBCUTANEOUS | Status: AC
  Administered 2014-05-16: 6 mg via SUBCUTANEOUS
  Filled 2014-05-16: qty 0.6

## 2014-05-16 MED ORDER — SODIUM CHLORIDE FLUSH 0.9 % IV SOLN: 10.0000 mL | Freq: Every day | INTRAVENOUS | Status: DC

## 2014-05-16 NOTE — Assessment & Plan Note (Signed)
She received education today about PICC line flushes home.

## 2014-05-16 NOTE — Patient Instructions (Signed)
Pegfilgrastim injection What is this medicine? PEGFILGRASTIM (peg fil GRA stim) is a long-acting granulocyte colony-stimulating factor that stimulates the growth of neutrophils, a type of white blood cell important in the body's fight against infection. It is used to reduce the incidence of fever and infection in patients with certain types of cancer who are receiving chemotherapy that affects the bone marrow. This medicine may be used for other purposes; ask your health care provider or pharmacist if you have questions. COMMON BRAND NAME(S): Neulasta What should I tell my health care provider before I take this medicine? They need to know if you have any of these conditions: -latex allergy -ongoing radiation therapy -sickle cell disease -skin reactions to acrylic adhesives (On-Body Injector only) -an unusual or allergic reaction to pegfilgrastim, filgrastim, other medicines, foods, dyes, or preservatives -pregnant or trying to get pregnant -breast-feeding How should I use this medicine? This medicine is for injection under the skin. If you get this medicine at home, you will be taught how to prepare and give the pre-filled syringe or how to use the On-body Injector. Refer to the patient Instructions for Use for detailed instructions. Use exactly as directed. Take your medicine at regular intervals. Do not take your medicine more often than directed. It is important that you put your used needles and syringes in a special sharps container. Do not put them in a trash can. If you do not have a sharps container, call your pharmacist or healthcare provider to get one. Talk to your pediatrician regarding the use of this medicine in children. Special care may be needed. Overdosage: If you think you have taken too much of this medicine contact a poison control center or emergency room at once. NOTE: This medicine is only for you. Do not share this medicine with others. What if I miss a dose? It is  important not to miss your dose. Call your doctor or health care professional if you miss your dose. If you miss a dose due to an On-body Injector failure or leakage, a new dose should be administered as soon as possible using a single prefilled syringe for manual use. What may interact with this medicine? Interactions have not been studied. Give your health care provider a list of all the medicines, herbs, non-prescription drugs, or dietary supplements you use. Also tell them if you smoke, drink alcohol, or use illegal drugs. Some items may interact with your medicine. This list may not describe all possible interactions. Give your health care provider a list of all the medicines, herbs, non-prescription drugs, or dietary supplements you use. Also tell them if you smoke, drink alcohol, or use illegal drugs. Some items may interact with your medicine. What should I watch for while using this medicine? You may need blood work done while you are taking this medicine. If you are going to need a MRI, CT scan, or other procedure, tell your doctor that you are using this medicine (On-Body Injector only). What side effects may I notice from receiving this medicine? Side effects that you should report to your doctor or health care professional as soon as possible: -allergic reactions like skin rash, itching or hives, swelling of the face, lips, or tongue -dizziness -fever -pain, redness, or irritation at site where injected -pinpoint red spots on the skin -shortness of breath or breathing problems -stomach or side pain, or pain at the shoulder -swelling -tiredness -trouble passing urine Side effects that usually do not require medical attention (report to your doctor   or health care professional if they continue or are bothersome): -bone pain -muscle pain This list may not describe all possible side effects. Call your doctor for medical advice about side effects. You may report side effects to FDA at  1-800-FDA-1088. Where should I keep my medicine? Keep out of the reach of children. Store pre-filled syringes in a refrigerator between 2 and 8 degrees C (36 and 46 degrees F). Do not freeze. Keep in carton to protect from light. Throw away this medicine if it is left out of the refrigerator for more than 48 hours. Throw away any unused medicine after the expiration date. NOTE: This sheet is a summary. It may not cover all possible information. If you have questions about this medicine, talk to your doctor, pharmacist, or health care provider.  2015, Elsevier/Gold Standard. (2013-09-07 16:14:05)  

## 2014-05-16 NOTE — Telephone Encounter (Signed)
gv and printed appt sched and avs for pt for DEC °

## 2014-05-16 NOTE — Assessment & Plan Note (Signed)
Recommend regular laxatives as needed.

## 2014-05-16 NOTE — Progress Notes (Signed)
[facility] OFFICE PROGRESS NOTE  Patient Care Team: Gaynelle Arabian, MD as PCP - General (Family Medicine) Brooks Sailors, RN as Oncology Nurse [place], RD as Dietitian (Nutrition) Eppie Gibson, MD as Attending Physician (Radiation Oncology)  SUMMARY OF ONCOLOGIC HISTORY: Oncology History   Cancer of base of tongue   Staging form: Lip and Oral Cavity, AJCC 7th Edition     Clinical: Stage IVA (T3, N2b, M0) - Signed by Heath Lark, MD on 05/09/2014       Cancer of base of tongue   04/20/2014 Imaging CT neck showed advanced base of tongue mass and multiple right neck lymphadenopathy   04/23/2014 Pathology Results [id number] FNA of right neck is positive for squamous cell cancer, P16 positive   05/09/2014 Imaging PET/CT scan confirmed base of tongue cancer along with lymph node metastasis.   05/10/2014 Procedure She has placement of PICC line.   05/11/2014 -  Chemotherapy She received induction chemotherapy with Taxotere, cisplatin and 5-FU.    INTERVAL HISTORY: Please see below for problem oriented charting. She tolerated treatment well apart from mild constipation. Sensation of pulling of secretion has resolved. She is able to swallow food well.  REVIEW OF SYSTEMS:   Constitutional: Denies fevers, chills or abnormal weight loss Eyes: Denies blurriness of vision Ears, nose, mouth, throat, and face: Denies mucositis or sore throat Respiratory: Denies cough, dyspnea or wheezes Cardiovascular: Denies palpitation, chest discomfort or lower extremity swelling Gastrointestinal:  Denies nausea, heartburn or change in bowel habits Skin: Denies abnormal skin rashes Lymphatics: Denies new lymphadenopathy or easy bruising Neurological:Denies numbness, tingling or new weaknesses Behavioral/Psych: Mood is stable, no new changes  All other systems were reviewed with the patient and are negative.  I have reviewed the past medical history, past surgical  history, social history and family history with the patient and they are unchanged from previous note.  ALLERGIES:  is allergic to penicillins.  MEDICATIONS:  Current Outpatient Prescriptions  Medication Sig Dispense Refill  . ALPRAZolam (XANAX) 0.25 MG tablet Take 0.25 mg by mouth as needed for anxiety.    Marland Kitchen amLODipine (NORVASC) 10 MG tablet Take 10 mg by mouth daily.    Marland Kitchen aspirin 81 MG tablet Take 81 mg by mouth daily.    Marland Kitchen atorvastatin (LIPITOR) 10 MG tablet Take 10 mg by mouth daily.    Marland Kitchen dexamethasone (DECADRON) 4 MG tablet Start the day before and the day after chemotherapy, 2 times a day every 3 weeks 30 tablet 1  . lidocaine-prilocaine (EMLA) cream Apply to affected area once 30 g 3  . losartan-hydrochlorothiazide (HYZAAR) 100-12.5 MG per tablet Take 1 tablet by mouth daily.    . metFORMIN (GLUCOPHAGE) 500 MG tablet Take 500 mg by mouth daily with breakfast.    . ondansetron (ZOFRAN) 8 MG tablet Take 1 tablet (8 mg total) by mouth every 8 (eight) hours as needed (Nausea or vomiting). 30 tablet 1  . Prenatal Vit-Fe Fumarate-FA (MULTIVITAMIN-PRENATAL) 27-0.8 MG TABS tablet Take 1 tablet by mouth daily at 12 noon.    . prochlorperazine (COMPAZINE) 10 MG tablet Take 1 tablet (10 mg total) by mouth every 6 (six) hours as needed (Nausea or vomiting). 30 tablet 1  . sodium fluoride (FLUORISHIELD) 1.1 % GEL dental gel Instill one drop of gel per tooth space of fluoride tray. Place over teeth for 5 minutes. Remove. Spit out excess. Repeat nightly. 120 mL prn  . Sodium Chloride Flush 0.9 % SOLN injection Inject 10  mLs into the vein daily. 60 Syringe 3   No current facility-administered medications for this visit.   Facility-Administered Medications Ordered in Other Visits  Medication Dose Route Frequency Provider Last Rate Last Dose  . sodium chloride 0.9 % injection 10 mL  10 mL Intracatheter PRN Heath Lark, MD   10 mL at 05/16/14 1121    PHYSICAL EXAMINATION: ECOG PERFORMANCE STATUS: 0 -  Asymptomatic  Filed Vitals:   05/16/14 1029  BP: 119/65  Pulse: 114  Temp: 98.3 F (36.8 C)  Resp: 18   Filed Weights   05/16/14 1029  Weight: 197 lb 6.4 oz (89.54 kg)    GENERAL:alert, no distress and comfortable SKIN: skin color, texture, turgor are normal, no rashes or significant lesions EYES: normal, Conjunctiva are pink and non-injected, sclera clear OROPHARYNX:no exudate, no erythema and lips, buccal mucosa, and tongue normal  NECK: supple, thyroid normal size, non-tender, without nodularity LYMPH:  no palpable lymphadenopathy in the  axillary or inguinal. Previously palpable lymphadenopathy has regressed in size. LUNGS: clear to auscultation and percussion with normal breathing effort HEART: regular rate & rhythm and no murmurs and no lower extremity edema ABDOMEN:abdomen soft, non-tender and normal bowel sounds Musculoskeletal:no cyanosis of digits and no clubbing  NEURO: alert & oriented x 3 with fluent speech, no focal motor/sensory deficits  LABORATORY DATA:  I have reviewed the data as listed    Component Value Date/Time   NA 139 05/10/2014 0959   NA 134* 06/30/2007 1432   K 3.7 05/10/2014 0959   K 3.5 06/30/2007 1432   CL 99 06/30/2007 1432   CO2 29 05/10/2014 0959   CO2 27 06/30/2007 1432   GLUCOSE 150* 05/10/2014 0959   GLUCOSE 121* 06/30/2007 1432   BUN 11.0 05/10/2014 0959   BUN 13 06/30/2007 1432   CREATININE 0.7 05/10/2014 0959   CREATININE 0.68 06/30/2007 1432   CALCIUM 9.9 05/10/2014 0959   CALCIUM 9.6 06/30/2007 1432   PROT 7.9 05/10/2014 0959   ALBUMIN 3.9 05/10/2014 0959   AST 18 05/10/2014 0959   ALT 23 05/10/2014 0959   ALKPHOS 106 05/10/2014 0959   BILITOT 0.46 05/10/2014 0959   GFRNONAA >60 06/30/2007 1432   GFRAA  06/30/2007 1432    >60        The eGFR has been calculated using the MDRD equation. This calculation has not been validated in all clinical situations. eGFR's persistently <60 mL/min signify possible Chronic Kidney  Disease.    No results found for: SPEP, UPEP  Lab Results  Component Value Date   WBC 7.3 05/10/2014   NEUTROABS 6.1 05/10/2014   HGB 12.2 05/10/2014   HCT 36.4 05/10/2014   MCV 91.0 05/10/2014   PLT 199 05/10/2014      Chemistry      Component Value Date/Time   NA 139 05/10/2014 0959   NA 134* 06/30/2007 1432   K 3.7 05/10/2014 0959   K 3.5 06/30/2007 1432   CL 99 06/30/2007 1432   CO2 29 05/10/2014 0959   CO2 27 06/30/2007 1432   BUN 11.0 05/10/2014 0959   BUN 13 06/30/2007 1432   CREATININE 0.7 05/10/2014 0959   CREATININE 0.68 06/30/2007 1432      Component Value Date/Time   CALCIUM 9.9 05/10/2014 0959   CALCIUM 9.6 06/30/2007 1432   ALKPHOS 106 05/10/2014 0959   AST 18 05/10/2014 0959   ALT 23 05/10/2014 0959   BILITOT 0.46 05/10/2014 0959     ASSESSMENT & PLAN:  Cancer of base of tongue She tolerated treatment well apart from mild constipation. Recommend we continue the same dose without dose adjustment and continue aggressive supportive care.  Diabetes mellitus type 2, controlled, without complications She will monitor her blood sugar carefully. If her blood sugar is consistently over 200, she may need dose adjustment to metformin.  S/P PICC central line placement She received education today about PICC line flushes home.  Constipation Recommend regular laxatives as needed.   Orders Placed This Encounter  Procedures  . Magnesium    Standing Status: Future     Number of Occurrences:      Standing Expiration Date: 06/20/2015   All questions were answered. The patient knows to call the clinic with any problems, questions or concerns. No barriers to learning was detected. I spent 25 minutes counseling the patient face to face. The total time spent in the appointment was 30 minutes and more than 50% was on counseling and review of test results     [facility], [place], MD 05/16/2014 12:04 PM

## 2014-05-16 NOTE — Assessment & Plan Note (Signed)
She will monitor her blood sugar carefully. If her blood sugar is consistently over 200, she may need dose adjustment to metformin. 

## 2014-05-16 NOTE — Progress Notes (Signed)
Delivered patient husband's FMLA forms to [employer] Noun, [employer].  [name], RN, BSN, [place] at [employer] [phone]

## 2014-05-16 NOTE — Assessment & Plan Note (Signed)
She tolerated treatment well apart from mild constipation. Recommend we continue the same dose without dose adjustment and continue aggressive supportive care.

## 2014-05-16 NOTE — Telephone Encounter (Signed)
Per staff message and POF I have scheduled appts. Advised scheduler of appts. JMW  

## 2014-05-16 NOTE — Progress Notes (Signed)
To provide support and encouragement, care continuity and to assess for needs, met with patient and her husband during appt with Dr. Alvy Bimler.  Husband brought FMLA forms for completion, I indicated I would deliver to the [employer] for processing, and to expect 7-10 days TA.  They denied any further needs/concerns, understand they can contact me if that changes.  [name], RN, BSN, [place] at [place] [phone]'

## 2014-05-16 NOTE — Progress Notes (Signed)
Put husband's fmla form on nurse's desk. °

## 2014-05-18 ENCOUNTER — Telehealth: Payer: Self-pay | Admitting: *Deleted

## 2014-05-18 ENCOUNTER — Encounter: Payer: Self-pay | Admitting: Hematology and Oncology

## 2014-05-18 ENCOUNTER — Emergency Department (HOSPITAL_COMMUNITY)
Admission: EM | Admit: 2014-05-18 | Discharge: 2014-05-18 | Disposition: A | Discharge status: Home / Self Care | Payer: BC Managed Care – PPO | Attending: Emergency Medicine | Admitting: Emergency Medicine

## 2014-05-18 ENCOUNTER — Encounter (HOSPITAL_COMMUNITY): Payer: Self-pay | Admitting: Emergency Medicine

## 2014-05-18 DIAGNOSIS — Z880 Allergy status to penicillin: Secondary | ICD-10-CM | POA: Insufficient documentation

## 2014-05-18 DIAGNOSIS — Z87891 Personal history of nicotine dependence: Secondary | ICD-10-CM | POA: Diagnosis not present

## 2014-05-18 DIAGNOSIS — Z79899 Other long term (current) drug therapy: Secondary | ICD-10-CM | POA: Diagnosis not present

## 2014-05-18 DIAGNOSIS — R042 Hemoptysis: Secondary | ICD-10-CM | POA: Diagnosis present

## 2014-05-18 DIAGNOSIS — K068 Other specified disorders of gingiva and edentulous alveolar ridge: Secondary | ICD-10-CM | POA: Diagnosis not present

## 2014-05-18 DIAGNOSIS — D101 Benign neoplasm of tongue: Secondary | ICD-10-CM | POA: Insufficient documentation

## 2014-05-18 DIAGNOSIS — I10 Essential (primary) hypertension: Secondary | ICD-10-CM | POA: Diagnosis not present

## 2014-05-18 DIAGNOSIS — E119 Type 2 diabetes mellitus without complications: Secondary | ICD-10-CM | POA: Insufficient documentation

## 2014-05-18 DIAGNOSIS — Z7952 Long term (current) use of systemic steroids: Secondary | ICD-10-CM | POA: Insufficient documentation

## 2014-05-18 DIAGNOSIS — Z7982 Long term (current) use of aspirin: Secondary | ICD-10-CM | POA: Diagnosis not present

## 2014-05-18 DIAGNOSIS — K1379 Other lesions of oral mucosa: Secondary | ICD-10-CM

## 2014-05-18 DIAGNOSIS — E780 Pure hypercholesterolemia: Secondary | ICD-10-CM | POA: Insufficient documentation

## 2014-05-18 DIAGNOSIS — D490 Neoplasm of unspecified behavior of digestive system: Secondary | ICD-10-CM

## 2014-05-18 HISTORY — DX: Malignant neoplasm of tongue, unspecified: C02.9

## 2014-05-18 LAB — CBC WITH DIFFERENTIAL/PLATELET
Basophils Absolute: 0 10*3/uL (ref 0.0–0.1)
Basophils Relative: 1 % (ref 0–1)
Eosinophils Absolute: 0.1 10*3/uL (ref 0.0–0.7)
Eosinophils Relative: 5 % (ref 0–5)
HCT: 34 % — ABNORMAL LOW (ref 36.0–46.0)
HEMOGLOBIN: 11.7 g/dL — AB (ref 12.0–15.0)
LYMPHS PCT: 72 % — AB (ref 12–46)
Lymphs Abs: 0.7 10*3/uL (ref 0.7–4.0)
MCH: 30.5 pg (ref 26.0–34.0)
MCHC: 34.4 g/dL (ref 30.0–36.0)
MCV: 88.8 fL (ref 78.0–100.0)
MONO ABS: 0.2 10*3/uL (ref 0.1–1.0)
Monocytes Relative: 17 % — ABNORMAL HIGH (ref 3–12)
Neutro Abs: 0.1 10*3/uL — ABNORMAL LOW (ref 1.7–7.7)
Neutrophils Relative %: 5 % — ABNORMAL LOW (ref 43–77)
Platelets: 126 10*3/uL — ABNORMAL LOW (ref 150–400)
RBC: 3.83 MIL/uL — ABNORMAL LOW (ref 3.87–5.11)
RDW: 11.9 % (ref 11.5–15.5)
WBC: 1.1 10*3/uL — CL (ref 4.0–10.5)

## 2014-05-18 LAB — COMPREHENSIVE METABOLIC PANEL
ALBUMIN: 3.4 g/dL — AB (ref 3.5–5.2)
ALK PHOS: 85 U/L (ref 39–117)
ALT: 37 U/L — AB (ref 0–35)
AST: 16 U/L (ref 0–37)
Anion gap: 14 (ref 5–15)
BUN: 19 mg/dL (ref 6–23)
CHLORIDE: 95 meq/L — AB (ref 96–112)
CO2: 26 mEq/L (ref 19–32)
Calcium: 9.2 mg/dL (ref 8.4–10.5)
Creatinine, Ser: 0.6 mg/dL (ref 0.50–1.10)
GFR calc Af Amer: >90 mL/min (ref >90)
GFR calc non Af Amer: >90 mL/min (ref >90)
Glucose, Bld: 177 mg/dL — ABNORMAL HIGH (ref 70–99)
POTASSIUM: 3.9 meq/L (ref 3.7–5.3)
SODIUM: 135 meq/L — AB (ref 137–147)
Total Bilirubin: 0.5 mg/dL (ref 0.3–1.2)
Total Protein: 7.4 g/dL (ref 6.0–8.3)

## 2014-05-18 LAB — PROTIME-INR
INR: 1.03 (ref 0.00–1.49)
Prothrombin Time: 13.6 seconds (ref 11.6–15.2)

## 2014-05-18 MED ORDER — HYDROGEN PEROXIDE 3 % EX SOLN
Freq: Every day | CUTANEOUS | Status: DC
  Administered 2014-05-18: 15:00:00 via TOPICAL
  Filled 2014-05-18: qty 473

## 2014-05-18 NOTE — ED Notes (Addendum)
Entered patient room to update VS Patient and female visitor no longer in the room  Will attempt to locate patient by checking hallways, bathroom and parking lot

## 2014-05-18 NOTE — Telephone Encounter (Signed)
Pt reports "a tad of diarrhea" yesterday and last night after eating Thanksgiving dinner.  She says it is improved this morning.   She also reports eating some toast and coughing on the crumbs.  She coughed up some blood tinged saliva or sputum, not sure which.  She coughed a few times and there was a little blood each time.  She says that has stopped now.  She also reports temp 99.1 F this morning.   Discussed side effects of her chemotherapy include diarrhea, mucositis and she can also have some low grade temps..  Pt denies any mouth sores or sore throat.  She is using mouth rinses and biotine.  She does have some very dry lips and using lip balm.   Instructed pt to call if blood continues and go to ED if she coughs or vomits up a teaspoon or more of fresh blood.  She verbalized understanding. Instructed pt to use imodium AD OTC as needed for diarrhea if it returns and also to drink plenty of liquids.  Continue to monitor her temp and call if 100.5 F or greater.  Continue good oral hygiene with rinses and use lip balm to protect her lips. Call us back if any other questions or worsening symptoms.  She verbalized understanding.

## 2014-05-18 NOTE — Discharge Instructions (Signed)
. Neutropenia Neutropenia is a condition that occurs when the level of a certain type of white blood cell (neutrophil) in your body becomes lower than normal. Neutrophils are made in the bone marrow and fight infections. These cells protect against bacteria and viruses. The fewer neutrophils you have, and the longer your body remains without them, the greater your risk of getting a severe infection becomes. CAUSES  The cause of neutropenia may be hard to determine. However, it is usually due to 3 main problems:   Decreased production of neutrophils. This may be due to:  Certain medicines such as chemotherapy.  Genetic problems.  Cancer.  Radiation treatments.  Vitamin deficiency.  Some pesticides.  Increased destruction of neutrophils. This may be due to:  Overwhelming infections.  Hemolytic anemia. This is when the body destroys its own blood cells.  Chemotherapy.  Neutrophils moving to areas of the body where they cannot fight infections. This may be due to:  Dialysis procedures.  Conditions where the spleen becomes enlarged. Neutrophils are held in the spleen and are not available to the rest of the body.  Overwhelming infections. The neutrophils are held in the area of the infection and are not available to the rest of the body. SYMPTOMS  There are no specific symptoms of neutropenia. The lack of neutrophils can result in an infection, and an infection can cause various problems. DIAGNOSIS  Diagnosis is made by a blood test. A complete blood count is performed. The normal level of neutrophils in human blood differs with age and race. Infants have lower counts than older children and adults. African Americans have lower counts than Caucasians or Asians. The average adult level is 1500 cells/mm3 of blood. Neutrophil counts are interpreted as follows:  Greater than 1000 cells/mm3 gives normal protection against infection.  500 to 1000 cells/mm3 gives an increased risk for  infection.  200 to 500 cells/mm3 is a greater risk for severe infection.  Lower than 200 cells/mm3 is a marked risk of infection. This may require hospitalization and treatment with antibiotic medicines. TREATMENT  Treatment depends on the underlying cause, severity, and presence of infections or symptoms. It also depends on your health. Your caregiver will discuss the treatment plan with you. Mild cases are often easily treated and have a good outcome. Preventative measures may also be started to limit your risk of infections. Treatment can include:  Taking antibiotics.  Stopping medicines that are known to cause neutropenia.  Correcting nutritional deficiencies by eating green vegetables to supply folic acid and taking vitamin B supplements.  Stopping exposure to pesticides if your neutropenia is related to pesticide exposure.  Taking a blood growth factor called sargramostim, pegfilgrastim, or filgrastim if you are undergoing chemotherapy for cancer. This stimulates white blood cell production.  Removal of the spleen if you have Felty's syndrome and have repeated infections. HOME CARE INSTRUCTIONS   Follow your caregiver's instructions about when you need to have blood work done.  Wash your hands often. Make sure others who come in contact with you also wash their hands.  Wash raw fruits and vegetables before eating them. They can carry bacteria and fungi.  Avoid people with colds or spreadable (contagious) diseases (chickenpox, herpes zoster, influenza).  Avoid large crowds.  Avoid construction areas. The dust can release fungus into the air.  Be cautious around children in daycare or school environments.  Take care of your respiratory system by coughing and deep breathing.  Bathe daily.  Protect your skin from cuts  and burns.  Do not work in the garden or with flowers and plants.  Care for the mouth before and after meals by brushing with a soft toothbrush. If you have  mucositis, do not use mouthwash. Mouthwash contains alcohol and can dry out the mouth even more.  Clean the area between the genitals and the anus (perineal area) after urination and bowel movements. Women need to wipe from front to back.  Use a water soluble lubricant during sexual intercourse and practice good hygiene after. Do not have intercourse if you are severely neutropenic. Check with your caregiver for guidelines.  Exercise daily as tolerated.  Avoid people who were vaccinated with a live vaccine in the past 30 days. You should not receive live vaccines (polio, typhoid).  Do not provide direct care for pets. Avoid animal droppings. Do not clean litter boxes and bird cages.  Do not share food utensils.  Do not use tampons, enemas, or rectal suppositories unless directed by your caregiver.  Use an electric razor to remove hair.  Wash your hands after handling magazines, letters, and newspapers. SEEK IMMEDIATE MEDICAL CARE IF:   You have a fever.  You have chills or start to shake.  You feel nauseous or vomit.  You develop mouth sores.  You develop aches and pains.  You have redness and swelling around open wounds.  Your skin is warm to the touch.  You have pus coming from your wounds.  You develop swollen lymph nodes.  You feel weak or fatigued.  You develop red streaks on the skin. MAKE SURE YOU:  Understand these instructions.  Will watch your condition.  Will get help right away if you are not doing well or get worse. Document Released: 11/28/2001 Document Revised: 08/31/2011 Document Reviewed: 12/26/2010 [facility] Patient Information 2015 [place], [place]. This information is not intended to replace advice given to you by your health care provider. Make sure you discuss any questions you have with your health care provider.  THE BLEEDING YOU EXPERIENCED IS SUSPECTED TO BE DUE TO YOUR TONGUE TUMOR. EAT LIQUID FOODS. RETURN IF BLEEDING IS MORE THAN BLOOD  STREAKING OF SALIVA OR OTHER CONCERNING SYMPTOMS DEVELOP. RETURN IF YOU THINK THE BLEEDING IS COMING FROM YOUR STOMACH OR LUNGS.

## 2014-05-18 NOTE — ED Notes (Signed)
Unable to locate patient in ED bathrooms, hallways, waiting room or ED parking lot Patient left without updated VS, review of DC instructions and f/u care While in ED, patient was alert and oriented x 4 and in NAD No episodes of hemoptysis witnessed by this nurse during the entire ED visit Will inform EDP that patient has left without completing the DC process

## 2014-05-18 NOTE — ED Notes (Signed)
Patient given GA with ice after OK by EDP

## 2014-05-18 NOTE — ED Notes (Signed)
After thanksgiving meals yesterday had diarrhea. Ate a little this AM, felt like she was getting choked on bread crumbs. Coughed up small amount of blood. Has been coughing up blood streaked phlegm ever since. Nov 20th first round of chemo for tumor on back of tongue/throat area.

## 2014-05-18 NOTE — ED Notes (Signed)
Patient informed of order, per nursing communication r/t 1/2 ice water and 1/2 hydrogen peroxide gargle Patient wanted to know "Who ordered this and why?" This nurse offered several times to have EDP come and speak with her--patient refused each offer Patient remains in NAD Patient able to speak clearly in full, complete sentences without difficulty--handles secretions No blood in sputum and/or saliva noted by this nurse since patient's arrival

## 2014-05-18 NOTE — ED Notes (Addendum)
Patient with c/o diarrhea after eating yesterday and blood in saliva Patient called PCP today about this issue and was informed that both of these symptoms could be r/t chemo tx that patient was just started on Patient alert and oriented x 4 Patient able to speak in full, complete sentences without difficulty--able to handle secretions Patient in NAD

## 2014-05-18 NOTE — Progress Notes (Signed)
Faxed husband's fmla form to [facility] @ [phone] and put originals in registration desk

## 2014-05-18 NOTE — ED Notes (Signed)
Patient wants to leave and go to the [facility] to be seen ED Charge at bedside to speak with patient

## 2014-05-18 NOTE — ED Notes (Signed)
MD at bedside. 

## 2014-05-18 NOTE — ED Provider Notes (Signed)
CSN: [id number]     Arrival date & time 05/18/14  1133 History   First MD Initiated Contact with Patient 05/18/14 1231     Chief Complaint  Patient presents with  . Hemoptysis     (Consider location/radiation/quality/duration/timing/severity/associated sxs/prior Treatment) HPI  The patient had an episode of bleeding today. Yesterday was Thanksgiving. She had had a fairly normal day but then developed some diarrhea in the evening. She has not had any fever. The patient is currently undergoing chemotherapy for a tongue cancer. This morning she thought that some toast would be appropriate and reports that as she was swallowing that she felt that the blood dried chromes were sticking in her throat. This caused her to have to cough and subsequently she brought up some saliva streaked blood. She called her oncologist and described the symptoms. She was told that if she had any bleeding more than a few teaspoons she was to come to the emergency department for assessment. She reports within a little less than an hour she ate some scrambled eggs which seemed to go down without any difficulty but just after that then she brought up blood streaked mucus it seemed like more than a couple teaspoons. There were no clots present and this was bright red. I have prompted the patient to help decipher whether or not she thought this was pulmonary or more from the back of her throat. The patient did not have the sensation that she had fluid or mucus that she was coughing up from her lungs. Also she reports this was not being vomited up from her stomach. This has not recurred since earlier this morning. To this point she reports she's been doing well with her chemotherapy. She has been able to swallow much more efficaciously since starting it. She reports her white blood cell count is projected to drop and she had a Neulasta shot yesterday and has been feeling fairly achy from that. She has not had a fever or any focal signs  of infection.  Past Medical History  Diagnosis Date  . Hypertension   . Diabetes mellitus without complication     "borderline diabetic"   . Hypercholesteremia   . Diabetes mellitus type 2, controlled, without complications 86/76/1950  . Cancer of tongue    Past Surgical History  Procedure Laterality Date  . Cervical ablation      Uterus  . Nasal fracture surgery  9326    Dr. Erik Obey   Family History  Problem Relation Age of Onset  . Hepatitis C Mother   . Heart failure Father   . Cancer Sister     melanoma  . Cancer Brother     melanoma  . Cancer Paternal Grandfather     colon ca   History  Substance Use Topics  . Smoking status: Former Smoker -- 0.50 packs/day for 5 years    Types: Cigarettes    Quit date: 06/22/1974  . Smokeless tobacco: Never Used  . Alcohol Use: 0.0 oz/week    0 Not specified per week     Comment: occl wine   OB History    No data available     Review of Systems 10 Systems reviewed and are negative for acute change except as noted in the HPI.    Allergies  Penicillins  Home Medications   Prior to Admission medications   Medication Sig Start Date End Date Taking? Authorizing Provider  ALPRAZolam (XANAX) 0.25 MG tablet Take 0.25 mg by mouth as needed  for anxiety.   Yes Historical Provider, MD  amLODipine (NORVASC) 10 MG tablet Take 10 mg by mouth daily.   Yes Historical Provider, MD  aspirin 81 MG tablet Take 81 mg by mouth daily.   Yes Historical Provider, MD  atorvastatin (LIPITOR) 10 MG tablet Take 10 mg by mouth daily.   Yes Historical Provider, MD  dexamethasone (DECADRON) 4 MG tablet Start the day before and the day after chemotherapy, 2 times a day every 3 weeks 05/09/14  Yes Heath Lark, MD  lidocaine-prilocaine (EMLA) cream Apply to affected area once 05/09/14  Yes Heath Lark, MD  losartan-hydrochlorothiazide (HYZAAR) 100-12.5 MG per tablet Take 1 tablet by mouth daily.   Yes Historical Provider, MD  metFORMIN (GLUCOPHAGE) 500  MG tablet Take 500 mg by mouth daily with breakfast.   Yes Historical Provider, MD  ondansetron (ZOFRAN) 8 MG tablet Take 1 tablet (8 mg total) by mouth every 8 (eight) hours as needed (Nausea or vomiting). 05/09/14  Yes Heath Lark, MD  Prenatal Vit-Fe Fumarate-FA (MULTIVITAMIN-PRENATAL) 27-0.8 MG TABS tablet Take 1 tablet by mouth daily at 12 noon.   Yes Historical Provider, MD  [place]   Yes Historical Provider, MD  prochlorperazine (COMPAZINE) 10 MG tablet Take 1 tablet (10 mg total) by mouth every 6 (six) hours as needed (Nausea or vomiting). 05/09/14  Yes Heath Lark, MD  Sodium Chloride Flush 0.9 % SOLN injection Inject 10 mLs into the vein daily. 05/16/14  Yes Heath Lark, MD  sodium fluoride (FLUORISHIELD) 1.1 % GEL dental gel Instill one drop of gel per tooth space of fluoride tray. Place over teeth for 5 minutes. Remove. Spit out excess. Repeat nightly. 05/09/14  Yes Lenn Cal, DDS   BP 127/88 mmHg  Pulse 114  Temp(Src) 98.8 F (37.1 C) (Oral)  Resp 16  SpO2 98% Physical Exam  Constitutional: She is oriented to person, place, and time. She appears well-developed and well-nourished. No distress.  HENT:  Head: Normocephalic and atraumatic.  Examination the posterior oropharynx reveals it to be widely patent. The patient is able to open her mouth such that there is the visualization of the top of an abnormal tumor mass that the base of her tongue. There is no apparent active bleeding. There is no dried blood within the oral cavity. There is no posterior oropharyngeal clot present.  Eyes: EOM are normal. Pupils are equal, round, and reactive to light. Right eye exhibits no discharge. Left eye exhibits no discharge.  Neck: Neck supple.  Cardiovascular: Normal rate, regular rhythm, normal heart sounds and intact distal pulses.   Pulmonary/Chest: Effort normal and breath sounds normal. No respiratory distress. She has no wheezes. She has no rales.   Abdominal: Soft.  Musculoskeletal: Normal range of motion. She exhibits no edema or tenderness.  Neurological: She is alert and oriented to person, place, and time. No cranial nerve deficit. Coordination normal.  Skin: Skin is warm and dry. She is not diaphoretic.  Psychiatric: She has a normal mood and affect.    ED Course  Procedures (including critical care time) Labs Review Labs Reviewed  COMPREHENSIVE METABOLIC PANEL - Abnormal; Notable for the following:    Sodium 135 (*)    Chloride 95 (*)    Glucose, Bld 177 (*)    Albumin 3.4 (*)    ALT 37 (*)    All other components within normal limits  CBC WITH DIFFERENTIAL - Abnormal; Notable for the following:    WBC 1.1 (*)  RBC 3.83 (*)    Hemoglobin 11.7 (*)    HCT 34.0 (*)    Platelets 126 (*)    Neutrophils Relative % 5 (*)    Lymphocytes Relative 72 (*)    Monocytes Relative 17 (*)    Neutro Abs 0.1 (*)    All other components within normal limits  PROTIME-INR    Imaging Review No results found.   EKG Interpretation None     Consult: The patient's case was reviewed with Dr. Burr Medico of oncology. She did review the chart and found that the patient is diagnosed with local disease. She reports that does make it much more likely that the bleeding is coming directly from the tumor. She reports it is a local and advanced tumor. She did suggest obtaining CBC to make sure that patient was not thrombopenic and a complete metabolic panel she did suggest reviewing the case with ENT as well. Consult: The patient's case reviewed Dr. Constance Holster. At this juncture with no further bleeding and episodes being self-limited he did not feel that there was no urgent need to repeat endoscopy. He suggested the patient could gargle several times with half hydrogen peroxide and half ice water. He reports that he is on call and available, the patient should contact him if any concerns develop. MDM   Final diagnoses:  Tumor of tongue  Bleeding from  mouth   At this point time bleeding has been self-limited. By historical review this sounds to have been localized to the tumor. At this point there is no evidence that this would be secondary to a PE or pulmonary source. The patient is counseled on signs and symptoms for which to return. She will have follow-up with oncology and ENT.    Charlesetta Shanks, MD 05/18/14 [phone]

## 2014-05-19 ENCOUNTER — Encounter (HOSPITAL_COMMUNITY): Payer: Self-pay | Admitting: Emergency Medicine

## 2014-05-19 ENCOUNTER — Other Ambulatory Visit: Payer: Self-pay

## 2014-05-19 ENCOUNTER — Inpatient Hospital Stay (HOSPITAL_COMMUNITY)
Admission: EM | Admit: 2014-05-19 | Discharge: 2014-05-22 | DRG: 872 | Disposition: A | Discharge status: Home / Self Care | Payer: BC Managed Care – PPO | Attending: Internal Medicine | Admitting: Internal Medicine

## 2014-05-19 ENCOUNTER — Emergency Department (HOSPITAL_COMMUNITY): Payer: BC Managed Care – PPO

## 2014-05-19 DIAGNOSIS — D62 Acute posthemorrhagic anemia: Secondary | ICD-10-CM | POA: Diagnosis present

## 2014-05-19 DIAGNOSIS — E876 Hypokalemia: Secondary | ICD-10-CM | POA: Diagnosis present

## 2014-05-19 DIAGNOSIS — R042 Hemoptysis: Secondary | ICD-10-CM | POA: Diagnosis present

## 2014-05-19 DIAGNOSIS — Z880 Allergy status to penicillin: Secondary | ICD-10-CM | POA: Diagnosis not present

## 2014-05-19 DIAGNOSIS — E785 Hyperlipidemia, unspecified: Secondary | ICD-10-CM | POA: Diagnosis present

## 2014-05-19 DIAGNOSIS — K920 Hematemesis: Secondary | ICD-10-CM | POA: Diagnosis present

## 2014-05-19 DIAGNOSIS — E871 Hypo-osmolality and hyponatremia: Secondary | ICD-10-CM | POA: Diagnosis present

## 2014-05-19 DIAGNOSIS — Z6831 Body mass index (BMI) 31.0-31.9, adult: Secondary | ICD-10-CM

## 2014-05-19 DIAGNOSIS — D709 Neutropenia, unspecified: Secondary | ICD-10-CM | POA: Diagnosis present

## 2014-05-19 DIAGNOSIS — R197 Diarrhea, unspecified: Secondary | ICD-10-CM | POA: Diagnosis present

## 2014-05-19 DIAGNOSIS — Z9221 Personal history of antineoplastic chemotherapy: Secondary | ICD-10-CM

## 2014-05-19 DIAGNOSIS — C779 Secondary and unspecified malignant neoplasm of lymph node, unspecified: Secondary | ICD-10-CM | POA: Diagnosis present

## 2014-05-19 DIAGNOSIS — A419 Sepsis, unspecified organism: Secondary | ICD-10-CM | POA: Diagnosis present

## 2014-05-19 DIAGNOSIS — D696 Thrombocytopenia, unspecified: Secondary | ICD-10-CM | POA: Diagnosis present

## 2014-05-19 DIAGNOSIS — R5081 Fever presenting with conditions classified elsewhere: Secondary | ICD-10-CM

## 2014-05-19 DIAGNOSIS — E119 Type 2 diabetes mellitus without complications: Secondary | ICD-10-CM | POA: Diagnosis present

## 2014-05-19 DIAGNOSIS — E440 Moderate protein-calorie malnutrition: Secondary | ICD-10-CM | POA: Diagnosis present

## 2014-05-19 DIAGNOSIS — E780 Pure hypercholesterolemia: Secondary | ICD-10-CM | POA: Diagnosis present

## 2014-05-19 DIAGNOSIS — C01 Malignant neoplasm of base of tongue: Secondary | ICD-10-CM | POA: Diagnosis present

## 2014-05-19 DIAGNOSIS — D630 Anemia in neoplastic disease: Secondary | ICD-10-CM | POA: Diagnosis present

## 2014-05-19 DIAGNOSIS — I10 Essential (primary) hypertension: Secondary | ICD-10-CM | POA: Diagnosis present

## 2014-05-19 DIAGNOSIS — Z87891 Personal history of nicotine dependence: Secondary | ICD-10-CM | POA: Diagnosis not present

## 2014-05-19 DIAGNOSIS — K922 Gastrointestinal hemorrhage, unspecified: Secondary | ICD-10-CM | POA: Insufficient documentation

## 2014-05-19 DIAGNOSIS — IMO0001 Reserved for inherently not codable concepts without codable children: Secondary | ICD-10-CM | POA: Insufficient documentation

## 2014-05-19 LAB — COMPREHENSIVE METABOLIC PANEL
ALK PHOS: 76 U/L (ref 39–117)
ALT: 25 U/L (ref 0–35)
ANION GAP: 16 — AB (ref 5–15)
AST: 10 U/L (ref 0–37)
Albumin: 3.1 g/dL — ABNORMAL LOW (ref 3.5–5.2)
BILIRUBIN TOTAL: 0.5 mg/dL (ref 0.3–1.2)
BUN: 9 mg/dL (ref 6–23)
CHLORIDE: 93 meq/L — AB (ref 96–112)
CO2: 25 mEq/L (ref 19–32)
Calcium: 9.3 mg/dL (ref 8.4–10.5)
Creatinine, Ser: 0.78 mg/dL (ref 0.50–1.10)
GFR, EST NON AFRICAN AMERICAN: 90 mL/min — AB (ref >90)
GLUCOSE: 206 mg/dL — AB (ref 70–99)
POTASSIUM: 3.3 meq/L — AB (ref 3.7–5.3)
Sodium: 134 mEq/L — ABNORMAL LOW (ref 137–147)
Total Protein: 7.3 g/dL (ref 6.0–8.3)

## 2014-05-19 LAB — CBC WITH DIFFERENTIAL/PLATELET
BASOS PCT: 1 % (ref 0–1)
Basophils Absolute: 0 10*3/uL (ref 0.0–0.1)
EOS ABS: 0 10*3/uL (ref 0.0–0.7)
Eosinophils Relative: 1 % (ref 0–5)
HCT: 32.2 % — ABNORMAL LOW (ref 36.0–46.0)
Hemoglobin: 11.1 g/dL — ABNORMAL LOW (ref 12.0–15.0)
LYMPHS PCT: 24 % (ref 12–46)
Lymphs Abs: 0.7 10*3/uL (ref 0.7–4.0)
MCH: 30.8 pg (ref 26.0–34.0)
MCHC: 34.5 g/dL (ref 30.0–36.0)
MCV: 89.4 fL (ref 78.0–100.0)
Monocytes Absolute: 1.1 10*3/uL — ABNORMAL HIGH (ref 0.1–1.0)
Monocytes Relative: 36 % — ABNORMAL HIGH (ref 3–12)
Neutro Abs: 1.3 10*3/uL — ABNORMAL LOW (ref 1.7–7.7)
Neutrophils Relative %: 38 % — ABNORMAL LOW (ref 43–77)
Platelets: 104 10*3/uL — ABNORMAL LOW (ref 150–400)
RBC: 3.6 MIL/uL — ABNORMAL LOW (ref 3.87–5.11)
RDW: 11.8 % (ref 11.5–15.5)
WBC: 3.1 10*3/uL — ABNORMAL LOW (ref 4.0–10.5)

## 2014-05-19 LAB — URINALYSIS, ROUTINE W REFLEX MICROSCOPIC
BILIRUBIN URINE: NEGATIVE
Glucose, UA: NEGATIVE mg/dL
Ketones, ur: NEGATIVE mg/dL
Leukocytes, UA: NEGATIVE
Nitrite: NEGATIVE
PROTEIN: 30 mg/dL — AB
Specific Gravity, Urine: 1.012 (ref 1.005–1.030)
Urobilinogen, UA: 0.2 mg/dL (ref 0.0–1.0)
pH: 6 (ref 5.0–8.0)

## 2014-05-19 LAB — MRSA PCR SCREENING: MRSA BY PCR: NEGATIVE

## 2014-05-19 LAB — CBC
HCT: 27.3 % — ABNORMAL LOW (ref 36.0–46.0)
HEMOGLOBIN: 9.4 g/dL — AB (ref 12.0–15.0)
MCH: 30.7 pg (ref 26.0–34.0)
MCHC: 34.4 g/dL (ref 30.0–36.0)
MCV: 89.2 fL (ref 78.0–100.0)
Platelets: 93 10*3/uL — ABNORMAL LOW (ref 150–400)
RBC: 3.06 MIL/uL — ABNORMAL LOW (ref 3.87–5.11)
RDW: 11.9 % (ref 11.5–15.5)
WBC: 4.3 10*3/uL (ref 4.0–10.5)

## 2014-05-19 LAB — PROTIME-INR
INR: 1.18 (ref 0.00–1.49)
Prothrombin Time: 15.2 seconds (ref 11.6–15.2)

## 2014-05-19 LAB — URINE MICROSCOPIC-ADD ON

## 2014-05-19 LAB — POC OCCULT BLOOD, ED: Fecal Occult Bld: POSITIVE — AB

## 2014-05-19 LAB — I-STAT CG4 LACTIC ACID, ED
Lactic Acid, Venous: 1.93 mmol/L (ref 0.5–2.2)
Lactic Acid, Venous: 1.53 mmol/L (ref 0.5–2.2)

## 2014-05-19 LAB — ABO/RH: ABO/RH(D): O POS

## 2014-05-19 LAB — PROCALCITONIN: PROCALCITONIN: 0.18 ng/mL

## 2014-05-19 LAB — LIPASE, BLOOD: Lipase: 10 U/L — ABNORMAL LOW (ref 11–59)

## 2014-05-19 MED ORDER — SODIUM CHLORIDE 0.9 % IJ SOLN
3.0000 mL | Freq: Two times a day (BID) | INTRAMUSCULAR | Status: DC
  Administered 2014-05-19: 3 mL via INTRAVENOUS

## 2014-05-19 MED ORDER — PANTOPRAZOLE SODIUM 40 MG IV SOLR
40.0000 mg | Freq: Two times a day (BID) | INTRAVENOUS | Status: DC
Start: 2014-05-23 — End: 2014-05-22

## 2014-05-19 MED ORDER — SODIUM CHLORIDE 0.9 % IV SOLN
INTRAVENOUS | Status: AC
  Administered 2014-05-19: 23:00:00 via INTRAVENOUS

## 2014-05-19 MED ORDER — ACETAMINOPHEN 325 MG PO TABS: 650.0000 mg | ORAL_TABLET | Freq: Four times a day (QID) | ORAL | Status: DC | PRN

## 2014-05-19 MED ORDER — LEVOFLOXACIN IN D5W 750 MG/150ML IV SOLN
750.0000 mg | INTRAVENOUS | Status: DC
  Administered 2014-05-20 – 2014-05-21 (×2): 750 mg via INTRAVENOUS
  Filled 2014-05-19 (×4): qty 150

## 2014-05-19 MED ORDER — ALPRAZOLAM 0.25 MG PO TABS
0.2500 mg | ORAL_TABLET | ORAL | Status: DC | PRN
  Administered 2014-05-20 – 2014-05-21 (×3): 0.25 mg via ORAL
  Filled 2014-05-19 (×3): qty 1

## 2014-05-19 MED ORDER — VANCOMYCIN HCL IN DEXTROSE 1-5 GM/200ML-% IV SOLN: 1000.0000 mg | Freq: Once | INTRAVENOUS | Status: DC

## 2014-05-19 MED ORDER — ONDANSETRON HCL 4 MG PO TABS: 4.0000 mg | ORAL_TABLET | Freq: Four times a day (QID) | ORAL | Status: DC | PRN

## 2014-05-19 MED ORDER — DEXTROSE 5 % IV SOLN: 1.0000 g | Freq: Three times a day (TID) | INTRAVENOUS | Status: DC

## 2014-05-19 MED ORDER — DEXTROSE 5 % IV SOLN
1.0000 g | Freq: Three times a day (TID) | INTRAVENOUS | Status: DC
  Administered 2014-05-19 – 2014-05-21 (×5): 1 g via INTRAVENOUS
  Filled 2014-05-19 (×7): qty 1

## 2014-05-19 MED ORDER — ONDANSETRON HCL 4 MG/2ML IJ SOLN: 4.0000 mg | Freq: Four times a day (QID) | INTRAMUSCULAR | Status: DC | PRN

## 2014-05-19 MED ORDER — LEVOFLOXACIN IN D5W 750 MG/150ML IV SOLN
750.0000 mg | Freq: Once | INTRAVENOUS | Status: AC
  Administered 2014-05-19: 750 mg via INTRAVENOUS
  Filled 2014-05-19: qty 150

## 2014-05-19 MED ORDER — SODIUM CHLORIDE 0.9 % IV SOLN
8.0000 mg/h | INTRAVENOUS | Status: DC
  Administered 2014-05-19 – 2014-05-22 (×4): 8 mg/h via INTRAVENOUS
  Filled 2014-05-19 (×11): qty 80

## 2014-05-19 MED ORDER — VANCOMYCIN HCL 10 G IV SOLR
1500.0000 mg | Freq: Once | INTRAVENOUS | Status: AC
  Administered 2014-05-19: 1500 mg via INTRAVENOUS
  Filled 2014-05-19: qty 1500

## 2014-05-19 MED ORDER — SODIUM CHLORIDE 0.9 % IV SOLN
80.0000 mg | Freq: Once | INTRAVENOUS | Status: AC
  Administered 2014-05-19: 80 mg via INTRAVENOUS
  Filled 2014-05-19: qty 80

## 2014-05-19 MED ORDER — ATORVASTATIN CALCIUM 10 MG PO TABS
10.0000 mg | ORAL_TABLET | Freq: Every day | ORAL | Status: DC
  Administered 2014-05-19 – 2014-05-21 (×3): 10 mg via ORAL
  Filled 2014-05-19 (×4): qty 1

## 2014-05-19 MED ORDER — ACETAMINOPHEN 650 MG RE SUPP: 650.0000 mg | Freq: Four times a day (QID) | RECTAL | Status: DC | PRN

## 2014-05-19 MED ORDER — HEPARIN SOD (PORK) LOCK FLUSH 100 UNIT/ML IV SOLN
500.0000 [IU] | Freq: Once | INTRAVENOUS | Status: DC
  Filled 2014-05-19: qty 5

## 2014-05-19 MED ORDER — DEXTROSE 5 % IV SOLN
2.0000 g | Freq: Once | INTRAVENOUS | Status: AC
  Administered 2014-05-19: 2 g via INTRAVENOUS
  Filled 2014-05-19: qty 2

## 2014-05-19 MED ORDER — VANCOMYCIN HCL IN DEXTROSE 1-5 GM/200ML-% IV SOLN
1000.0000 mg | Freq: Two times a day (BID) | INTRAVENOUS | Status: DC
  Administered 2014-05-20 – 2014-05-22 (×5): 1000 mg via INTRAVENOUS
  Filled 2014-05-19 (×6): qty 200

## 2014-05-19 NOTE — Progress Notes (Signed)
[facility] new arrival MD note    ICD-9-CM ICD-10-CM   1. Neutropenic fever 288.00 D70.9    780.61    2. Sepsis, due to unspecified organism 038.9 A41.9    995.91    3. Gastrointestinal hemorrhage, unspecified gastritis, unspecified gastrointestinal hemorrhage type 578.9 K92.2     On camera exam    - HR 101, Pulse 96%, RR 18, and MAP 75  - looks stable, RN at bedside  [facility] PULMONARY No results for input(s): PHART, PCO2ART, PO2ART, HCO3, TCO2, O2SAT in the last 168 hours.  Invalid input(s): PCO2, PO2  CBC  Recent Labs Lab 05/18/14 1413 05/19/14 1538  HGB 11.7* 11.1*  HCT 34.0* 32.2*  WBC 1.1* 3.1*  PLT 126* 104*    COAGULATION  Recent Labs Lab 05/18/14 1413 05/19/14 1627  INR 1.03 1.18    CARDIAC  No results for input(s): TROPONINI in the last 168 hours. No results for input(s): PROBNP in the last 168 hours.   CHEMISTRY  Recent Labs Lab 05/18/14 1413 05/19/14 1538  NA 135* 134*  K 3.9 3.3*  CL 95* 93*  CO2 26 25  GLUCOSE 177* 206*  BUN 19 9  CREATININE 0.60 0.78  CALCIUM 9.2 9.3   Estimated Creatinine Clearance: 87.7 mL/min (by C-G formula based on Cr of 0.78).   LIVER  Recent Labs Lab 05/18/14 1413 05/19/14 1538 05/19/14 1627  AST 16 10  --   ALT 37* 25  --   ALKPHOS 85 76  --   BILITOT 0.5 0.5  --   PROT 7.4 7.3  --   ALBUMIN 3.4* 3.1*  --   INR 1.03  --  1.18     INFECTIOUS  Recent Labs Lab 05/19/14 1613 05/19/14 2104  LATICACIDVEN 1.93 1.53     ENDOCRINE CBG (last 3)  No results for input(s): GLUCAP in the last 72 hours.       IMAGING x48h Dg Chest Port 1 View  05/19/2014   CLINICAL DATA:  Fever, weakness, hemoptysis.  Tongue cancer.  EXAM: PORTABLE CHEST - 1 VIEW  COMPARISON:  PET-CT dated 05/09/2014  FINDINGS: Lungs are essentially clear.  No pleural effusion or pneumothorax.  The heart is normal in size.  IMPRESSION: No evidence of acute cardiopulmonary disease.   Electronically Signed   By: Julian Hy  M.D.   On: 05/19/2014 17:30     A Neutropencie fever  P No acute eMD interventioins right now Order  Mag phos for the AM Check PCT  Dr. Brand Males, M.D., [facility].C.P Pulmonary and Critical Care Medicine Staff Physician [facility] Pager: [phone], If no answer or between  15:00h - 7:00h: call [phone]  05/19/2014 9:21 PM

## 2014-05-19 NOTE — H&P (Signed)
PCP: Simona Huh, MD  On[facility]  Chief Complaint:  Vomiting some blood   HPI: [name] is a 58 y.o. female   has a past medical history of Hypertension; Diabetes mellitus without complication; Hypercholesteremia; Diabetes mellitus type 2, controlled, without complications (93/71/6967); and Cancer of tongue.   Presented with  Patietn was diagnosed with HPV induced tongue cancer November 2nd she was given 1 st chemo treatment and a Neulasta on 11/25. Patient presented to ER yesterday with coughing up blood she was evaluated in ER and went home for follow up. This AM she started to get nausea and vomitng up blood about 1 cup full. Patient have had some diarrhea as well for the past few days. In ER she was found to be febrile up to 101.1 yesterday she was borderling neutropenic she was started on aztreonam, levaquin and Vanc  Hospitalist was called for admission for Sepsis, neutropenic fever. hematemesis  Review of Systems:    Pertinent positives include: Fevers, chills, fatigue, nausea, vomiting, diarrhea,hematemesis  Constitutional:  No weight loss, night sweats, weight loss  HEENT:  No headaches, Difficulty swallowing,Tooth/dental problems,Sore throat,  No sneezing, itching, ear ache, nasal congestion, post nasal drip,  Cardio-vascular:  No chest pain, Orthopnea, PND, anasarca, dizziness, palpitations.no Bilateral lower extremity swelling  GI:  No heartburn, indigestion, abdominal pain,  change in bowel habits, loss of appetite, melena, blood in stool,  Resp:  no shortness of breath at rest. No dyspnea on exertion, No excess mucus, no productive cough, No non-productive cough, No coughing up of blood.No change in color of mucus.No wheezing. Skin:  no rash or lesions. No jaundice GU:  no dysuria, change in color of urine, no urgency or frequency. No straining to urinate.  No flank pain.  Musculoskeletal:  No joint pain or no joint swelling. No decreased range  of motion. No back pain.  Psych:  No change in mood or affect. No depression or anxiety. No memory loss.  Neuro: no localizing neurological complaints, no tingling, no weakness, no double vision, no gait abnormality, no slurred speech, no confusion  Otherwise ROS are negative except for above, 10 systems were reviewed  Past Medical History: Past Medical History  Diagnosis Date  . Hypertension   . Diabetes mellitus without complication     "borderline diabetic"   . Hypercholesteremia   . Diabetes mellitus type 2, controlled, without complications 89/38/1017  . Cancer of tongue    Past Surgical History  Procedure Laterality Date  . Cervical ablation      Uterus  . Nasal fracture surgery  5102    Dr. Erik Obey     Medications: Prior to Admission medications   Medication Sig Start Date End Date Taking? Authorizing Provider  ALPRAZolam Duanne Moron) 0.25 MG tablet Take 0.25 mg by mouth as needed for anxiety.   Yes Historical Provider, MD  amLODipine (NORVASC) 10 MG tablet Take 10 mg by mouth daily.   Yes Historical Provider, MD  aspirin 81 MG tablet Take 81 mg by mouth daily.   Yes Historical Provider, MD  atorvastatin (LIPITOR) 10 MG tablet Take 10 mg by mouth daily.   Yes Historical Provider, MD  dexamethasone (DECADRON) 4 MG tablet Start the day before and the day after chemotherapy, 2 times a day every 3 weeks 05/09/14  Yes Heath Lark, MD  loperamide (IMODIUM) 2 MG capsule Take 2 mg by mouth as needed for diarrhea or loose stools.   Yes Historical Provider, MD  losartan-hydrochlorothiazide (HYZAAR) 100-12.5  MG per tablet Take 1 tablet by mouth daily.   Yes Historical Provider, MD  metFORMIN (GLUCOPHAGE) 500 MG tablet Take 500 mg by mouth daily with breakfast.   Yes Historical Provider, MD  ondansetron (ZOFRAN) 8 MG tablet Take 1 tablet (8 mg total) by mouth every 8 (eight) hours as needed (Nausea or vomiting). Patient taking differently: Take 8 mg by mouth every 8 (eight) hours as needed  for nausea or vomiting.  05/09/14  Yes Heath Lark, MD  Prenatal Vit-Fe Fumarate-FA (MULTIVITAMIN-PRENATAL) 27-0.8 MG TABS tablet Take 1 tablet by mouth daily at 12 noon.   Yes Historical Provider, MD  [place]   Yes Historical Provider, MD  prochlorperazine (COMPAZINE) 10 MG tablet Take 1 tablet (10 mg total) by mouth every 6 (six) hours as needed (Nausea or vomiting). 05/09/14  Yes Heath Lark, MD  Sodium Chloride Flush 0.9 % SOLN injection Inject 10 mLs into the vein daily. 05/16/14  Yes Heath Lark, MD  sodium fluoride (FLUORISHIELD) 1.1 % GEL dental gel Instill one drop of gel per tooth space of fluoride tray. Place over teeth for 5 minutes. Remove. Spit out excess. Repeat nightly. 05/09/14  Yes Lenn Cal, DDS  lidocaine-prilocaine (EMLA) cream Apply to affected area once 05/09/14   Heath Lark, MD    Allergies:   Allergies  Allergen Reactions  . Penicillins Hives and Other (See Comments)    "Tongue swelling"    Social History:  Ambulatory   Independently Lives at home  With family     reports that she quit smoking about 39 years ago. Her smoking use included Cigarettes. She has a 2.5 pack-year smoking history. She has never used smokeless tobacco. She reports that she drinks alcohol. She reports that she does not use illicit drugs.    Family History: family history includes Cancer in her brother, paternal grandfather, and sister; Heart failure in her father; Hepatitis C in her mother.    Physical Exam: Patient Vitals for the past 24 hrs:  BP Temp Temp src Pulse Resp SpO2 Weight  05/19/14 1753 (!) 109/51 mmHg 99.8 F (37.7 C) Oral (!) 123 20 96 % -  05/19/14 1635 - - - - - - 89.359 kg (197 lb)  05/19/14 1558 - 101.1 F (38.4 C) Rectal - - - -  05/19/14 1455 118/57 mmHg 98.2 F (36.8 C) Oral 112 18 100 % -    1. General:  in No Acute distress 2. Psychological: Alert and  Oriented 3. Head/ENT:    Dry Mucous Membranes                           Head Non traumatic, neck supple                          Normal Dentition                         No blood visualized in the oral cavity                         Bilateral lymphadenopathy noted 4. SKIN:  decreased Skin turgor,  Skin clean Dry and intact no rash 5. Heart: Regular rate and rhythm no Murmur, Rub or gallop 6. Lungs: Clear to auscultation bilaterally, no wheezes or crackles   7. Abdomen: Soft, non-tender, Non distended 8. Lower extremities: no  clubbing, cyanosis, or edema 9. Neurologically strength 5 out of 5 in all 4 extremities cranial nerves intact 10. MSK: Normal range of motion Hemoccult positive  body mass index is 30.38 kg/(m^2).   Labs on Admission:   Results for orders placed or performed during the hospital encounter of 05/19/14 (from the past 24 hour(s))  CBC with Differential     Status: Abnormal   Collection Time: 05/19/14  3:38 PM  Result Value Ref Range   WBC 3.1 (L) 4.0 - 10.5 K/uL   RBC 3.60 (L) 3.87 - 5.11 MIL/uL   Hemoglobin 11.1 (L) 12.0 - 15.0 g/dL   HCT 32.2 (L) 36.0 - 46.0 %   MCV 89.4 78.0 - 100.0 fL   MCH 30.8 26.0 - 34.0 pg   MCHC 34.5 30.0 - 36.0 g/dL   RDW 11.8 11.5 - 15.5 %   Platelets 104 (L) 150 - 400 K/uL   Neutrophils Relative % 38 (L) 43 - 77 %   Lymphocytes Relative 24 12 - 46 %   Monocytes Relative 36 (H) 3 - 12 %   Eosinophils Relative 1 0 - 5 %   Basophils Relative 1 0 - 1 %   Neutro Abs 1.3 (L) 1.7 - 7.7 K/uL   Lymphs Abs 0.7 0.7 - 4.0 K/uL   Monocytes Absolute 1.1 (H) 0.1 - 1.0 K/uL   Eosinophils Absolute 0.0 0.0 - 0.7 K/uL   Basophils Absolute 0.0 0.0 - 0.1 K/uL   Smear Review LARGE PLATELETS PRESENT   Comprehensive metabolic panel     Status: Abnormal   Collection Time: 05/19/14  3:38 PM  Result Value Ref Range   Sodium 134 (L) 137 - 147 mEq/L   Potassium 3.3 (L) 3.7 - 5.3 mEq/L   Chloride 93 (L) 96 - 112 mEq/L   CO2 25 19 - 32 mEq/L   Glucose, Bld 206 (H) 70 - 99 mg/dL   BUN 9 6 - 23 mg/dL   Creatinine, Ser 0.78  0.50 - 1.10 mg/dL   Calcium 9.3 8.4 - 10.5 mg/dL   Total Protein 7.3 6.0 - 8.3 g/dL   Albumin 3.1 (L) 3.5 - 5.2 g/dL   AST 10 0 - 37 U/L   ALT 25 0 - 35 U/L   Alkaline Phosphatase 76 39 - 117 U/L   Total Bilirubin 0.5 0.3 - 1.2 mg/dL   GFR calc non Af Amer 90 (L) >90 mL/min   GFR calc Af Amer >90 >90 mL/min   Anion gap 16 (H) 5 - 15  Lipase, blood     Status: Abnormal   Collection Time: 05/19/14  3:38 PM  Result Value Ref Range   Lipase 10 (L) 11 - 59 U/L  POC occult blood, ED Provider will collect     Status: Abnormal   Collection Time: 05/19/14  4:00 PM  Result Value Ref Range   Fecal Occult Bld POSITIVE (A) NEGATIVE  I-Stat CG4 Lactic Acid, ED     Status: None   Collection Time: 05/19/14  4:13 PM  Result Value Ref Range   Lactic Acid, Venous 1.93 0.5 - 2.2 mmol/L  ABO/Rh     Status: None   Collection Time: 05/19/14  4:21 PM  Result Value Ref Range   ABO/RH(D) O POS   Protime-INR     Status: None   Collection Time: 05/19/14  4:27 PM  Result Value Ref Range   Prothrombin Time 15.2 11.6 - 15.2 seconds   INR 1.18 0.00 - 1.49  Type and screen     Status: None   Collection Time: 05/19/14  4:27 PM  Result Value Ref Range   ABO/RH(D) O POS    Antibody Screen NEG    Sample Expiration 05/22/2014   Urinalysis, Routine w reflex microscopic     Status: Abnormal   Collection Time: 05/19/14  5:52 PM  Result Value Ref Range   Color, Urine YELLOW YELLOW   APPearance CLOUDY (A) CLEAR   Specific Gravity, Urine 1.012 1.005 - 1.030   pH 6.0 5.0 - 8.0   Glucose, UA NEGATIVE NEGATIVE mg/dL   Hgb urine dipstick TRACE (A) NEGATIVE   Bilirubin Urine NEGATIVE NEGATIVE   Ketones, ur NEGATIVE NEGATIVE mg/dL   Protein, ur 30 (A) NEGATIVE mg/dL   Urobilinogen, UA 0.2 0.0 - 1.0 mg/dL   Nitrite NEGATIVE NEGATIVE   Leukocytes, UA NEGATIVE NEGATIVE  Urine microscopic-add on     Status: Abnormal   Collection Time: 05/19/14  5:52 PM  Result Value Ref Range   Squamous Epithelial / LPF FEW (A)  RARE   WBC, UA 3-6 <3 WBC/hpf   Urine-Other MUCOUS PRESENT     UA 3-6 WBC,   No results found for: HGBA1C  Estimated Creatinine Clearance: 87.7 mL/min (by C-G formula based on Cr of 0.78).  BNP (last 3 results) No results for input(s): PROBNP in the last 8760 hours.  Other results:  I have pearsonaly reviewed this: ECG REPORT  Rate: 103  Rhythm: ST ST&T Change: no evidence of ischemia   Filed Weights   05/19/14 1635  Weight: 89.359 kg (197 lb)     Cultures: No results found for: SDES, SPECREQUEST, CULT, REPTSTATUS   Radiological Exams on Admission: Dg Chest Port 1 View  05/19/2014   CLINICAL DATA:  Fever, weakness, hemoptysis.  Tongue cancer.  EXAM: PORTABLE CHEST - 1 VIEW  COMPARISON:  PET-CT dated 05/09/2014  FINDINGS: Lungs are essentially clear.  No pleural effusion or pneumothorax.  The heart is normal in size.  IMPRESSION: No evidence of acute cardiopulmonary disease.   Electronically Signed   By: Julian Hy M.D.   On: 05/19/2014 17:30    Chart has been reviewed  Assessment/Plan  58 year old female currently undergoing chemotherapy for history of tongue cancer here with sepsis and hematemesis. Dr. Benson Norway aware we'll see in the morning  Present on Admission:  . Sepsis - admit to step down given neutropenic fever continue broad-spectrum antibiotics. Wait results of blood and urine culture. Obtain pro-calcitonin  . Neutropenic fever - broad-spectrum antibiotics and blood cultures  . Cancer of base of tongue - will need to discuss with oncology in the morning without the patient being admitted  . Hypertension - his antihypertensives will hold blood pressure meds  Diabetes 2 - sinuses sliding scale  Hematemesis  - possibly secondary to tongue lesion. Cannot rule out gastric source. Will admit to step down, serial CBC, Protonix drip discussed with Dr. Benson Norway who will see in a.m.    Prophylaxis: SCD , Protonix  CODE STATUS:  FULL CODE    Other plan as per  orders.  I have spent a total of 55 min on this admission  [name] 05/19/2014, 7:51 PM  [facility]  Pager [phone]   after 2 AM please page floor coverage PA If 7AM-7PM, please contact the day team taking care of the patient  [email or web address]  Password [email or web address]

## 2014-05-19 NOTE — ED Notes (Signed)
Bed: YL16 Expected date:  Expected time:  Means of arrival:  Comments: No bed in room

## 2014-05-19 NOTE — ED Notes (Addendum)
Pt states that she has been having NV since last night at 2130.  Pt was here yesterday for hemoptysis.  States that there were streaks of blood in her vomit today (more than she coughed up yesterday).  Pt is markedly more fatigued than she was yesterday when this nurse saw her.

## 2014-05-19 NOTE — Progress Notes (Signed)
ANTIBIOTIC CONSULT NOTE - INITIAL  Pharmacy Consult for Vancomycin/ Aztreonam/ Levofloxacin Indication: rule out sepsis  Allergies  Allergen Reactions  . Penicillins Hives and Other (See Comments)    "Tongue swelling"   Patient Measurements:   Total body weight: 89.5 kg  Vital Signs: Temp: 101.1 F (38.4 C) (11/28 1558) Temp Source: Rectal (11/28 1558) BP: 118/57 mmHg (11/28 1455) Pulse Rate: 112 (11/28 1455) Intake/Output from previous day:   Intake/Output from this shift:    Labs:  Recent Labs  05/18/14 1413 05/19/14 1538  WBC 1.1* 3.1*  HGB 11.7* 11.1*  PLT 126* PENDING  CREATININE 0.60  --    Estimated Creatinine Clearance: 87.9 mL/min (by C-G formula based on Cr of 0.6). No results for input(s): VANCOTROUGH, VANCOPEAK, VANCORANDOM, GENTTROUGH, GENTPEAK, GENTRANDOM, TOBRATROUGH, TOBRAPEAK, TOBRARND, AMIKACINPEAK, AMIKACINTROU, AMIKACIN in the last 72 hours.   Microbiology: No results found for this or any previous visit (from the past 720 hour(s)).  Medical History: Past Medical History  Diagnosis Date  . Hypertension   . Diabetes mellitus without complication     "borderline diabetic"   . Hypercholesteremia   . Diabetes mellitus type 2, controlled, without complications 88/89/1694  . Cancer of tongue    Medications:  Scheduled:  . heparin lock flush  500 Units Intracatheter Once   Anti-infectives    Start     Dose/Rate Route Frequency Ordered Stop   05/19/14 1615  vancomycin (VANCOCIN) IVPB 1000 mg/200 mL premix     1,000 mg200 mL/hr over 60 Minutes Intravenous  Once 05/19/14 1600     05/19/14 1600  levofloxacin (LEVAQUIN) IVPB 750 mg     750 mg100 mL/hr over 90 Minutes Intravenous  Once 05/19/14 1600     05/19/14 1600  aztreonam (AZACTAM) 2 g in dextrose 5 % 50 mL IVPB     2 g100 mL/hr over 30 Minutes Intravenous  Once 05/19/14 1600       Assessment: 2 yoF with hx of tongue Ca: completed Cycle 1 of Docetaxel/Cisplatin/5Fu 11/20-25, with  Neulasta. In ED 11/27 for vomiting, stated hemoptysis; ENT MD suggested 1/2 water/Peroxide gargle, patient left without notifying staff. Returned to ED 11/28 with vomiting, hemoptysis, febrile to 101.1, WBC 3.1K with ANC 0.1  Vancomycin, Aztreonam (PCN allergy), Levaquin per pharmacy to r/o sepsis   Goal of Therapy:  Vancomycin trough level 15-20 mcg/ml  Plan:   Levaquin 750mg  IV q24  Aztreonam 2gm x1 in ED, then 1gm q8hr  Vancomycin 1500mg  x1 in ED, then 1gm q12  De-escalate abx when able, suggest d/c Levaquin when response seen  Thank you for the consult,  Minda Ditto PharmD Pager [phone] 05/19/2014, 4:18 PM

## 2014-05-19 NOTE — ED Provider Notes (Signed)
CSN: [id number]     Arrival date & time 05/19/14  1445 History   First MD Initiated Contact with Patient 05/19/14 1532     Chief Complaint  Patient presents with  . Nausea  . Emesis  . Cancer     (Consider location/radiation/quality/duration/timing/severity/associated sxs/prior Treatment) HPI  This is a 58 year old female with a history of tongue cancer status post chemotherapy with first round completed on November 25. She was seen here yesterday after an episode of coughing that was productive of some bloody sputum. She had a normal workup with the exception of neutropenia with a absolute neutrophil count of 55 yesterday. She went home and then began having nausea and vomiting are in the night with blood mixed in with emesis. She has continued to feel weak. She describes some diffuse bony aches that she was told to expect after the chemotherapy and Neulasta. She denies any difficulty breathing. She had some choking episodes yesterday. She had some coughing yesterday but this has not appeared to worsen to her. She does not feel short of breath or have abdominal pain. She has not noted any rashes. Past Medical History  Diagnosis Date  . Hypertension   . Diabetes mellitus without complication     "borderline diabetic"   . Hypercholesteremia   . Diabetes mellitus type 2, controlled, without complications 62/83/1517  . Cancer of tongue    Past Surgical History  Procedure Laterality Date  . Cervical ablation      Uterus  . Nasal fracture surgery  6160    Dr. Erik Obey   Family History  Problem Relation Age of Onset  . Hepatitis C Mother   . Heart failure Father   . Cancer Sister     melanoma  . Cancer Brother     melanoma  . Cancer Paternal Grandfather     colon ca   History  Substance Use Topics  . Smoking status: Former Smoker -- 0.50 packs/day for 5 years    Types: Cigarettes    Quit date: 06/22/1974  . Smokeless tobacco: Never Used  . Alcohol Use: 0.0 oz/week    0 Not  specified per week     Comment: occl wine   OB History    No data available     Review of Systems  All other systems reviewed and are negative.     Allergies  Penicillins  Home Medications   Prior to Admission medications   Medication Sig Start Date End Date Taking? Authorizing Provider  ALPRAZolam Duanne Moron) 0.25 MG tablet Take 0.25 mg by mouth as needed for anxiety.   Yes Historical Provider, MD  amLODipine (NORVASC) 10 MG tablet Take 10 mg by mouth daily.   Yes Historical Provider, MD  aspirin 81 MG tablet Take 81 mg by mouth daily.   Yes Historical Provider, MD  atorvastatin (LIPITOR) 10 MG tablet Take 10 mg by mouth daily.   Yes Historical Provider, MD  dexamethasone (DECADRON) 4 MG tablet Start the day before and the day after chemotherapy, 2 times a day every 3 weeks 05/09/14  Yes Heath Lark, MD  loperamide (IMODIUM) 2 MG capsule Take 2 mg by mouth as needed for diarrhea or loose stools.   Yes Historical Provider, MD  losartan-hydrochlorothiazide (HYZAAR) 100-12.5 MG per tablet Take 1 tablet by mouth daily.   Yes Historical Provider, MD  metFORMIN (GLUCOPHAGE) 500 MG tablet Take 500 mg by mouth daily with breakfast.   Yes Historical Provider, MD  ondansetron (ZOFRAN) 8 MG tablet  Take 1 tablet (8 mg total) by mouth every 8 (eight) hours as needed (Nausea or vomiting). Patient taking differently: Take 8 mg by mouth every 8 (eight) hours as needed for nausea or vomiting.  05/09/14  Yes Heath Lark, MD  Prenatal Vit-Fe Fumarate-FA (MULTIVITAMIN-PRENATAL) 27-0.8 MG TABS tablet Take 1 tablet by mouth daily at 12 noon.   Yes Historical Provider, MD  [place]   Yes Historical Provider, MD  prochlorperazine (COMPAZINE) 10 MG tablet Take 1 tablet (10 mg total) by mouth every 6 (six) hours as needed (Nausea or vomiting). 05/09/14  Yes Heath Lark, MD  Sodium Chloride Flush 0.9 % SOLN injection Inject 10 mLs into the vein daily. 05/16/14  Yes Heath Lark, MD  sodium  fluoride (FLUORISHIELD) 1.1 % GEL dental gel Instill one drop of gel per tooth space of fluoride tray. Place over teeth for 5 minutes. Remove. Spit out excess. Repeat nightly. 05/09/14  Yes Lenn Cal, DDS  lidocaine-prilocaine (EMLA) cream Apply to affected area once 05/09/14   Ni Gorsuch, MD   BP 109/51 mmHg  Pulse 123  Temp(Src) 99.8 F (37.7 C) (Oral)  Resp 20  Wt 197 lb (89.359 kg)  SpO2 96% Physical Exam  Constitutional: She is oriented to person, place, and time. She appears well-developed and well-nourished.  HENT:  Head: Normocephalic and atraumatic.  Right Ear: External ear normal.  Left Ear: External ear normal.  Nose: Nose normal.  Mouth/Throat: Oropharynx is clear and moist.  Eyes: Conjunctivae and EOM are normal. Pupils are equal, round, and reactive to light.  Neck: Normal range of motion. Neck supple. No JVD present. No tracheal deviation present. No thyromegaly present.  Cardiovascular: Normal heart sounds and intact distal pulses.  Tachycardia present.   Pulmonary/Chest: Effort normal and breath sounds normal. She has no wheezes.  Abdominal: Soft. Bowel sounds are normal. She exhibits no mass. There is no tenderness. There is no guarding.  Musculoskeletal: Normal range of motion.  Lymphadenopathy:    She has no cervical adenopathy.  Neurological: She is alert and oriented to person, place, and time. She has normal reflexes. No cranial nerve deficit or sensory deficit. Gait normal. GCS eye subscore is 4. GCS verbal subscore is 5. GCS motor subscore is 6.  Reflex Scores:      Bicep reflexes are 2+ on the right side and 2+ on the left side.      Patellar reflexes are 2+ on the right side and 2+ on the left side. Strength is 5/5 bilateral elbow flexor/extensors, wrist extension/flexion, intrinsic hand strength equal Bilateral hip flexion/extension 5/5, knee flexion/extension 5/5, ankle 5/5 flexion extension    Skin: Skin is warm and dry.  Psychiatric: She has a  normal mood and affect. Her behavior is normal. Judgment and thought content normal.  Nursing note and vitals reviewed.   ED Course  Procedures (including critical care time) Labs Review Labs Reviewed  CBC WITH DIFFERENTIAL - Abnormal; Notable for the following:    WBC 3.1 (*)    RBC 3.60 (*)    Hemoglobin 11.1 (*)    HCT 32.2 (*)    Platelets 104 (*)    Neutrophils Relative % 38 (*)    Monocytes Relative 36 (*)    Neutro Abs 1.3 (*)    Monocytes Absolute 1.1 (*)    All other components within normal limits  COMPREHENSIVE METABOLIC PANEL - Abnormal; Notable for the following:    Sodium 134 (*)    Potassium 3.3 (*)  Chloride 93 (*)    Glucose, Bld 206 (*)    Albumin 3.1 (*)    GFR calc non Af Amer 90 (*)    Anion gap 16 (*)    All other components within normal limits  LIPASE, BLOOD - Abnormal; Notable for the following:    Lipase 10 (*)    All other components within normal limits  URINALYSIS, ROUTINE W REFLEX MICROSCOPIC - Abnormal; Notable for the following:    APPearance CLOUDY (*)    Hgb urine dipstick TRACE (*)    Protein, ur 30 (*)    All other components within normal limits  URINE MICROSCOPIC-ADD ON - Abnormal; Notable for the following:    Squamous Epithelial / LPF FEW (*)    All other components within normal limits  POC OCCULT BLOOD, ED - Abnormal; Notable for the following:    Fecal Occult Bld POSITIVE (*)    All other components within normal limits  CULTURE, BLOOD (ROUTINE X 2)  CULTURE, BLOOD (ROUTINE X 2)  URINE CULTURE  PROTIME-INR  OCCULT BLOOD X 1 CARD TO LAB, STOOL  I-STAT CG4 LACTIC ACID, ED  I-STAT CG4 LACTIC ACID, ED  TYPE AND SCREEN  ABO/RH    Imaging Review Dg Chest Port 1 View  05/19/2014   CLINICAL DATA:  Fever, weakness, hemoptysis.  Tongue cancer.  EXAM: PORTABLE CHEST - 1 VIEW  COMPARISON:  PET-CT dated 05/09/2014  FINDINGS: Lungs are essentially clear.  No pleural effusion or pneumothorax.  The heart is normal in size.   IMPRESSION: No evidence of acute cardiopulmonary disease.   Electronically Signed   By: Julian Hy M.D.   On: 05/19/2014 17:30     EKG Interpretation   Date/Time:  Saturday May 19 2014 16:26:07 EST Ventricular Rate:  103 PR Interval:  136 QRS Duration: 86 QT Interval:  350 QTC Calculation: 458 R Axis:   70 Text Interpretation:  Sinus tachycardia Low voltage, precordial leads  Borderline T abnormalities, diffuse leads Baseline wander in lead(s) II V3  V4 Confirmed by Meryn Sarracino MD, Andee Poles ([id number]) on 05/19/2014 6:40:17 PM      MDM   Final diagnoses:  Neutropenic fever  Sepsis, due to unspecified organism  Gastrointestinal hemorrhage, unspecified gastritis, unspecified gastrointestinal hemorrhage type    This is a 58 year old female on chemotherapy for tongue cancer who presents today with fever. She is initially started on sepsis protocol as she was febrile to 11.5 and tachycardic with absolute neutrophil count from yesterday at 55. Repeat labs today show that her absolute neutrophil count has increased to 1178. She has had blood cultures obtained and received broad-spectrum antibiotics to cover sepsis of unknown etiology with a penicillin allergy. She also reports GI bleeding and had a Hemoccult done here that was positive for blood. She has been typed and screened but has not had ongoing obvious bleeding and hemoglobin was 11.  Patient's care was discussed with Dr.Sherrel Plan admission to hospitalist. Patient has received bolus of normal saline per sepsis protocol and heart rate has decreased to 104 and blood pressure is 109/50. I discussed results the patient and her family and they voice understanding.  6:45 PM   Shaune Pollack, MD 05/20/14 [id number]

## 2014-05-20 DIAGNOSIS — K920 Hematemesis: Secondary | ICD-10-CM

## 2014-05-20 DIAGNOSIS — R110 Nausea: Secondary | ICD-10-CM

## 2014-05-20 LAB — CBC
HCT: 26.6 % — ABNORMAL LOW (ref 36.0–46.0)
HEMATOCRIT: 27 % — AB (ref 36.0–46.0)
HEMATOCRIT: 26.4 % — AB (ref 36.0–46.0)
HEMATOCRIT: 23 % — AB (ref 36.0–46.0)
HEMOGLOBIN: 9.3 g/dL — AB (ref 12.0–15.0)
Hemoglobin: 9.2 g/dL — ABNORMAL LOW (ref 12.0–15.0)
Hemoglobin: 9.3 g/dL — ABNORMAL LOW (ref 12.0–15.0)
Hemoglobin: 8 g/dL — ABNORMAL LOW (ref 12.0–15.0)
MCH: 30.9 pg (ref 26.0–34.0)
MCH: 30.4 pg (ref 26.0–34.0)
MCH: 31.1 pg (ref 26.0–34.0)
MCH: 31.3 pg (ref 26.0–34.0)
MCHC: 34.6 g/dL (ref 30.0–36.0)
MCHC: 34.4 g/dL (ref 30.0–36.0)
MCHC: 35.2 g/dL (ref 30.0–36.0)
MCHC: 34.8 g/dL (ref 30.0–36.0)
MCV: 89.3 fL (ref 78.0–100.0)
MCV: 88.2 fL (ref 78.0–100.0)
MCV: 88.3 fL (ref 78.0–100.0)
MCV: 89.8 fL (ref 78.0–100.0)
PLATELETS: 91 10*3/uL — AB (ref 150–400)
PLATELETS: 118 10*3/uL — AB (ref 150–400)
Platelets: 110 10*3/uL — ABNORMAL LOW (ref 150–400)
Platelets: 105 10*3/uL — ABNORMAL LOW (ref 150–400)
RBC: 2.98 MIL/uL — AB (ref 3.87–5.11)
RBC: 3.06 MIL/uL — AB (ref 3.87–5.11)
RBC: 2.99 MIL/uL — ABNORMAL LOW (ref 3.87–5.11)
RBC: 2.56 MIL/uL — AB (ref 3.87–5.11)
RDW: 12 % (ref 11.5–15.5)
RDW: 12 % (ref 11.5–15.5)
RDW: 12 % (ref 11.5–15.5)
RDW: 12.1 % (ref 11.5–15.5)
WBC: 5.9 10*3/uL (ref 4.0–10.5)
WBC: 7.1 10*3/uL (ref 4.0–10.5)
WBC: 10 10*3/uL (ref 4.0–10.5)
WBC: 12.5 10*3/uL — AB (ref 4.0–10.5)

## 2014-05-20 LAB — COMPREHENSIVE METABOLIC PANEL
ALBUMIN: 2.5 g/dL — AB (ref 3.5–5.2)
ALT: 17 U/L (ref 0–35)
AST: 10 U/L (ref 0–37)
Alkaline Phosphatase: 67 U/L (ref 39–117)
Anion gap: 12 (ref 5–15)
BUN: 9 mg/dL (ref 6–23)
CALCIUM: 8.4 mg/dL (ref 8.4–10.5)
CO2: 25 mEq/L (ref 19–32)
CREATININE: 0.63 mg/dL (ref 0.50–1.10)
Chloride: 98 mEq/L (ref 96–112)
GFR calc Af Amer: >90 mL/min (ref >90)
Glucose, Bld: 169 mg/dL — ABNORMAL HIGH (ref 70–99)
Potassium: 3.1 mEq/L — ABNORMAL LOW (ref 3.7–5.3)
Sodium: 135 mEq/L — ABNORMAL LOW (ref 137–147)
Total Bilirubin: 0.3 mg/dL (ref 0.3–1.2)
Total Protein: 5.9 g/dL — ABNORMAL LOW (ref 6.0–8.3)

## 2014-05-20 LAB — TROPONIN I

## 2014-05-20 LAB — PHOSPHORUS: Phosphorus: 1.5 mg/dL — ABNORMAL LOW (ref 2.3–4.6)

## 2014-05-20 LAB — OCCULT BLOOD X 1 CARD TO LAB, STOOL: Fecal Occult Bld: POSITIVE — AB

## 2014-05-20 LAB — PROCALCITONIN: PROCALCITONIN: 0.25 ng/mL

## 2014-05-20 LAB — TSH: TSH: 2.85 u[IU]/mL (ref 0.350–4.500)

## 2014-05-20 LAB — CLOSTRIDIUM DIFFICILE BY PCR: Toxigenic C. Difficile by PCR: NEGATIVE

## 2014-05-20 LAB — MAGNESIUM: Magnesium: 1.7 mg/dL (ref 1.5–2.5)

## 2014-05-20 LAB — HEMOGLOBIN A1C
Hgb A1c MFr Bld: 6.4 % — ABNORMAL HIGH (ref <5.7)
Mean Plasma Glucose: 137 mg/dL — ABNORMAL HIGH (ref <117)

## 2014-05-20 MED ORDER — SODIUM CHLORIDE 0.9 % IV SOLN
INTRAVENOUS | Status: AC
  Administered 2014-05-20: 125 mL/h via INTRAVENOUS
  Administered 2014-05-20: 17:00:00 via INTRAVENOUS

## 2014-05-20 MED ORDER — METFORMIN HCL 500 MG PO TABS
500.0000 mg | ORAL_TABLET | Freq: Every day | ORAL | Status: DC
  Administered 2014-05-21 – 2014-05-22 (×2): 500 mg via ORAL
  Filled 2014-05-20 (×2): qty 1

## 2014-05-20 MED ORDER — POTASSIUM CHLORIDE 10 MEQ/100ML IV SOLN
10.0000 meq | INTRAVENOUS | Status: AC
  Administered 2014-05-20 (×3): 10 meq via INTRAVENOUS
  Filled 2014-05-20 (×4): qty 100

## 2014-05-20 MED ORDER — SODIUM CHLORIDE 0.9 % IJ SOLN
10.0000 mL | INTRAMUSCULAR | Status: DC | PRN
  Administered 2014-05-21: 10 mL
  Administered 2014-05-22: 20 mL
  Filled 2014-05-20 (×2): qty 40

## 2014-05-20 MED ORDER — LOPERAMIDE HCL 2 MG PO CAPS
2.0000 mg | ORAL_CAPSULE | ORAL | Status: DC | PRN
  Administered 2014-05-20 – 2014-05-21 (×2): 2 mg via ORAL
  Filled 2014-05-20 (×2): qty 1

## 2014-05-20 NOTE — Plan of Care (Signed)
Problem: Phase I Progression Outcomes Goal: Pain controlled with appropriate interventions Outcome: Not Applicable Date Met:  37/34/28 Denies pain. Goal: OOB as tolerated unless otherwise ordered Outcome: Progressing Sitting on the side of the bed.  Ambulating to bathroom with standby assist. Goal: Voiding-avoid urinary catheter unless indicated Outcome: Completed/Met Date Met:  05/20/14 Goal: Hemodynamically stable Outcome: Progressing

## 2014-05-20 NOTE — Progress Notes (Addendum)
Patient ID: [name], female   DOB: August 25, 1955, 58 y.o.   MRN: [id number] [facility] PROGRESS NOTE  [name] AYO:[id number] DOB: 03-05-1956 DOA: 06-Jun-2014 PCP: Alice Huh, MD  Brief narrative:    58 y.o. 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 Patient also had associated diarrhea that has lasted over past few days prior to this admission. In ED, patient was found to be febrile with heart rate 123, respiratory rate 20, blood pressure 104/46, oxygen saturation 96% on room air. Blood work revealed hemoglobin of 9.4, neutrophils low at 1.3, platelets 93, potassium 3.3 and blood glucose 206. Chest x-ray on admission showed no evidence of acute cardiopulmonary disease. Patient was started on broad-spectrum antibiotics for possible febrile neutropenia and sepsis.   Assessment/Plan:    Principal problem: Febrile neutropenia - Likely secondary to sequela of recent chemotherapy. Patient has received Neulasta support 05/16/2014 - Neutrophil count on admission low at 1.3 normal white blood cell count - Continue current antibiotics, Azactam, Levaquin, vancomycin - Follow-up blood culture results, stool for C. Difficile - Follow-up CBC with differential tomorrow morning  Active problems: Sepsis - Sepsis criteria met on the admission with initial vitals that included tachycardia, hypotension, fever. In addition patient has procalcitonin level of 0.25. Unclear source of infection. Blood cultures pending. C. difficile is pending. Urinalysis on the admission did not show evidence of infection. Hematemesis / Acute blood loss anemia / anemia of chronic disease / thrombocytopenia  - Appreciate the GI consult and recommendations. So far no GI interventions  planned.  - Patient is on Protonix drip as well as Protonix 40 mg IV every 12 hours.  - Diet regular.  - Continue IV fluids but we can decrease the rate to 75 mL an hour  Cancer of base of tongue - Management per oncology. Last chemotherapy 05/11/2014 with Neulasta support  Diabetes mellitus type 2, controlled, without complications - Patient tolerates by mouth intake so will resume metformin  Essential hypertension - Due to soft blood pressure antihypertensives are on hold.  Dyslipidemia - Continue Lipitor daily  DVT Prophylaxis  - We will stop heparin subcutaneous and start SCDs bilaterally due to risk of bleeding   Code Status: Full.  Family Communication:  plan of care discussed with the patient Disposition Plan: Home when stable. Transfer to telemetry today, order placed.  IV access:   Peripheral IV  Procedures and diagnostic studies:    Dg Chest Port 1 View 06-06-14   No evidence of acute cardiopulmonary disease.     Medical Consultants:   GI (Dr. Carol Ada) Other Consultants:   None  IAnti-Infectives:    Azactam 2014/06/06 -->  Levaquin 06/06/14 -->  Vancomycin 2014/06/06 -->   Leisa Lenz, MD  [facility] Pager [phone]  If 7PM-7AM, please contact night-coverage [email or web address] Password [email or web address] 05/20/2014, 8:20 AM   LOS: 1 day    HPI/Subjective: No acute overnight events.  Objective: Filed Vitals:   05/20/14 0600 05/20/14 0700 05/20/14 0748 05/20/14 0814  BP: 129/42     Pulse: 99  97   Temp:    98.7 F (37.1 C)  TempSrc:    Oral  Resp: 16  18   Height:      Weight:      SpO2: 98% 95% 97%     Intake/Output Summary (  Last 24 hours) at 05/20/14 0820 Last data filed at 05/20/14 0813  Gross per 24 hour  Intake      0 ml  Output    751 ml  Net   -751 ml    Exam:   General:  Pt is alert, follows commands appropriately, not in acute distress  Cardiovascular: Regular rate and rhythm, S1/S2, no murmurs  Respiratory: Clear to  auscultation bilaterally, no wheezing, no crackles, no rhonchi  Abdomen: Soft, non tender, non distended, bowel sounds present  Extremities: No edema, pulses DP and PT palpable bilaterally  Neuro: Grossly nonfocal  Data Reviewed: Basic Metabolic Panel:  Recent Labs Lab 05/18/14 1413 05/19/14 1538 05/20/14 0600  NA 135* 134* 135*  K 3.9 3.3* 3.1*  CL 95* 93* 98  CO2 '26 25 25  ' GLUCOSE 177* 206* 169*  BUN '19 9 9  ' CREATININE 0.60 0.78 0.63  CALCIUM 9.2 9.3 8.4  MG  --   --  1.7  PHOS  --   --  1.5*   Liver Function Tests:  Recent Labs Lab 05/18/14 1413 05/19/14 1538 05/20/14 0600  AST '16 10 10  ' ALT 37* 25 17  ALKPHOS 85 76 67  BILITOT 0.5 0.5 0.3  PROT 7.4 7.3 5.9*  ALBUMIN 3.4* 3.1* 2.5*    Recent Labs Lab 05/19/14 1538  LIPASE 10*   No results for input(s): AMMONIA in the last 168 hours. CBC:  Recent Labs Lab 05/18/14 1413 05/19/14 1538 05/19/14 2129 05/20/14 0350 05/20/14 0600  WBC 1.1* 3.1* 4.3 5.9 7.1  NEUTROABS 0.1* 1.3*  --   --   --   HGB 11.7* 11.1* 9.4* 9.2* 9.3*  HCT 34.0* 32.2* 27.3* 26.6* 27.0*  MCV 88.8 89.4 89.2 89.3 88.2  PLT 126* 104* 93* 91* 110*   Cardiac Enzymes:  Recent Labs Lab 05/20/14 0600  TROPONINI <0.30   BNP: Invalid input(s): POCBNP CBG: No results for input(s): GLUCAP in the last 168 hours.  Recent Results (from the past 240 hour(s))  MRSA PCR Screening     Status: None   Collection Time: 05/19/14  9:28 PM  Result Value Ref Range Status   MRSA by PCR NEGATIVE NEGATIVE Final    Comment:           Scheduled Meds: . atorvastatin  10 mg Oral Daily  . aztreonam  1 g Intravenous Q8H  . levofloxacin  750 mg Intravenous Q24H  .  pantoprazole  40 mg Intravenous Q12H  . potassium chloride  10 mEq Intravenous Q1 Hr x 3  . vancomycin  1,000 mg Intravenous Q12H   Continuous Infusions: . sodium chloride    . pantoprozole (PROTONIX) infusion 8 mg/hr (05/19/14 2321)

## 2014-05-20 NOTE — Consult Note (Signed)
Unassigned Consult  Reason for Consult: Hematemesis. 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  Initially her symptoms started as a cough with hemoptysis and she estimates two teaspoons.  This occurred after eating toast.  Subsequently she ate scrambled eggs and then this induced vomiting with some hematemesis.  Again, she estimated the bleeding amount to be 2 teaspoons.  She continued to have some hemoptysis and she presented to the ER and she was provided with a peroxide solution to help arrest the bleeding.  When she went home she continued to have coughing with hemoptysis and hematemesis.  The patient reported having a significant amount of hematemesis, but she does not know if it was swallowed blood.  At that time her coughing with hemoptysis was rather severe.  Her SCC on the 03/2014 CT scan revealed a large exophytic mass that was compromising the oropharynx.  Since the start of her treatment she reports feeling better in that she can swallow and breathe better.  Past Medical History  Diagnosis Date  . Hypertension   . Diabetes mellitus without complication     "borderline diabetic"   . Hypercholesteremia   . Diabetes mellitus type 2, controlled, without complications 46/56/8127  . Cancer of tongue     Past Surgical History  Procedure Laterality Date  . Cervical ablation      Uterus  . Nasal fracture surgery  5170    Dr. Erik Obey    Family History  Problem Relation Age of Onset  . Hepatitis C Mother   . Heart failure Father   . Cancer Sister     melanoma  . Cancer Brother     melanoma  . Cancer Paternal Grandfather     colon ca    Social History:  reports that she quit smoking about 39 years ago. Her smoking use included Cigarettes. She has a 2.5  pack-year smoking history. She has never used smokeless tobacco. She reports that she drinks alcohol. She reports that she does not use illicit drugs.  Allergies:  Allergies  Allergen Reactions  . Penicillins Hives and Other (See Comments)    "Tongue swelling"    Medications:  Scheduled: . atorvastatin  10 mg Oral Daily  . aztreonam  1 g Intravenous Q8H  . heparin lock flush  500 Units Intracatheter Once  . levofloxacin (LEVAQUIN) IV  750 mg Intravenous Q24H  . [START ON 05/23/2014] pantoprazole (PROTONIX) IV  40 mg Intravenous Q12H  . sodium chloride  3 mL Intravenous Q12H  . vancomycin  1,000 mg Intravenous Q12H   Continuous: . sodium chloride 125 mL/hr at 05/20/14 0600  . pantoprozole (PROTONIX) infusion 8 mg/hr (05/19/14 2321)    Results for orders placed or performed during the hospital encounter of 05/19/14 (from the past 24 hour(s))  CBC with Differential     Status: Abnormal   Collection Time: 05/19/14  3:38 PM  Result Value Ref Range   WBC 3.1 (L) 4.0 - 10.5 K/uL   RBC 3.60 (L) 3.87 - 5.11 MIL/uL   Hemoglobin 11.1 (L) 12.0 - 15.0 g/dL   HCT 32.2 (L) 36.0 - 46.0 %   MCV 89.4 78.0 - 100.0 fL   MCH 30.8 26.0 - 34.0 pg   MCHC 34.5 30.0 - 36.0 g/dL   RDW  11.8 11.5 - 15.5 %   Platelets 104 (L) 150 - 400 K/uL   Neutrophils Relative % 38 (L) 43 - 77 %   Lymphocytes Relative 24 12 - 46 %   Monocytes Relative 36 (H) 3 - 12 %   Eosinophils Relative 1 0 - 5 %   Basophils Relative 1 0 - 1 %   Neutro Abs 1.3 (L) 1.7 - 7.7 K/uL   Lymphs Abs 0.7 0.7 - 4.0 K/uL   Monocytes Absolute 1.1 (H) 0.1 - 1.0 K/uL   Eosinophils Absolute 0.0 0.0 - 0.7 K/uL   Basophils Absolute 0.0 0.0 - 0.1 K/uL   Smear Review LARGE PLATELETS PRESENT   Comprehensive metabolic panel     Status: Abnormal   Collection Time: 05/19/14  3:38 PM  Result Value Ref Range   Sodium 134 (L) 137 - 147 mEq/L   Potassium 3.3 (L) 3.7 - 5.3 mEq/L   Chloride 93 (L) 96 - 112 mEq/L   CO2 25 19 - 32 mEq/L   Glucose,  Bld 206 (H) 70 - 99 mg/dL   BUN 9 6 - 23 mg/dL   Creatinine, Ser 0.78 0.50 - 1.10 mg/dL   Calcium 9.3 8.4 - 10.5 mg/dL   Total Protein 7.3 6.0 - 8.3 g/dL   Albumin 3.1 (L) 3.5 - 5.2 g/dL   AST 10 0 - 37 U/L   ALT 25 0 - 35 U/L   Alkaline Phosphatase 76 39 - 117 U/L   Total Bilirubin 0.5 0.3 - 1.2 mg/dL   GFR calc non Af Amer 90 (L) >90 mL/min   GFR calc Af Amer >90 >90 mL/min   Anion gap 16 (H) 5 - 15  Lipase, blood     Status: Abnormal   Collection Time: 05/19/14  3:38 PM  Result Value Ref Range   Lipase 10 (L) 11 - 59 U/L  POC occult blood, ED Provider will collect     Status: Abnormal   Collection Time: 05/19/14  4:00 PM  Result Value Ref Range   Fecal Occult Bld POSITIVE (A) NEGATIVE  I-Stat CG4 Lactic Acid, ED     Status: None   Collection Time: 05/19/14  4:13 PM  Result Value Ref Range   Lactic Acid, Venous 1.93 0.5 - 2.2 mmol/L  ABO/Rh     Status: None   Collection Time: 05/19/14  4:21 PM  Result Value Ref Range   ABO/RH(D) O POS   Protime-INR     Status: None   Collection Time: 05/19/14  4:27 PM  Result Value Ref Range   Prothrombin Time 15.2 11.6 - 15.2 seconds   INR 1.18 0.00 - 1.49  Type and screen     Status: None   Collection Time: 05/19/14  4:27 PM  Result Value Ref Range   ABO/RH(D) O POS    Antibody Screen NEG    Sample Expiration 05/22/2014   Urinalysis, Routine w reflex microscopic     Status: Abnormal   Collection Time: 05/19/14  5:52 PM  Result Value Ref Range   Color, Urine YELLOW YELLOW   APPearance CLOUDY (A) CLEAR   Specific Gravity, Urine 1.012 1.005 - 1.030   pH 6.0 5.0 - 8.0   Glucose, UA NEGATIVE NEGATIVE mg/dL   Hgb urine dipstick TRACE (A) NEGATIVE   Bilirubin Urine NEGATIVE NEGATIVE   Ketones, ur NEGATIVE NEGATIVE mg/dL   Protein, ur 30 (A) NEGATIVE mg/dL   Urobilinogen, UA 0.2 0.0 - 1.0 mg/dL   Nitrite NEGATIVE  NEGATIVE   Leukocytes, UA NEGATIVE NEGATIVE  Urine microscopic-add on     Status: Abnormal   Collection Time: 05/19/14   5:52 PM  Result Value Ref Range   Squamous Epithelial / LPF FEW (A) RARE   WBC, UA 3-6 <3 WBC/hpf   Urine-Other MUCOUS PRESENT   I-Stat CG4 Lactic Acid, ED     Status: None   Collection Time: 05/19/14  9:04 PM  Result Value Ref Range   Lactic Acid, Venous 1.53 0.5 - 2.2 mmol/L  Procalcitonin - Baseline     Status: None   Collection Time: 05/19/14  9:22 PM  Result Value Ref Range   Procalcitonin 0.18 ng/mL  MRSA PCR Screening     Status: None   Collection Time: 05/19/14  9:28 PM  Result Value Ref Range   MRSA by PCR NEGATIVE NEGATIVE  Hemoglobin A1c     Status: Abnormal   Collection Time: 05/19/14  9:29 PM  Result Value Ref Range   Hgb A1c MFr Bld 6.4 (H) <5.7 %   Mean Plasma Glucose 137 (H) <117 mg/dL  CBC     Status: Abnormal   Collection Time: 05/19/14  9:29 PM  Result Value Ref Range   WBC 4.3 4.0 - 10.5 K/uL   RBC 3.06 (L) 3.87 - 5.11 MIL/uL   Hemoglobin 9.4 (L) 12.0 - 15.0 g/dL   HCT 27.3 (L) 36.0 - 46.0 %   MCV 89.2 78.0 - 100.0 fL   MCH 30.7 26.0 - 34.0 pg   MCHC 34.4 30.0 - 36.0 g/dL   RDW 11.9 11.5 - 15.5 %   Platelets 93 (L) 150 - 400 K/uL  CBC     Status: Abnormal   Collection Time: 05/20/14  3:50 AM  Result Value Ref Range   WBC 5.9 4.0 - 10.5 K/uL   RBC 2.98 (L) 3.87 - 5.11 MIL/uL   Hemoglobin 9.2 (L) 12.0 - 15.0 g/dL   HCT 26.6 (L) 36.0 - 46.0 %   MCV 89.3 78.0 - 100.0 fL   MCH 30.9 26.0 - 34.0 pg   MCHC 34.6 30.0 - 36.0 g/dL   RDW 12.0 11.5 - 15.5 %   Platelets 91 (L) 150 - 400 K/uL  Magnesium     Status: None   Collection Time: 05/20/14  6:00 AM  Result Value Ref Range   Magnesium 1.7 1.5 - 2.5 mg/dL  Phosphorus     Status: Abnormal   Collection Time: 05/20/14  6:00 AM  Result Value Ref Range   Phosphorus 1.5 (L) 2.3 - 4.6 mg/dL  Troponin I     Status: None   Collection Time: 05/20/14  6:00 AM  Result Value Ref Range   Troponin I <0.30 <0.30 ng/mL  Comprehensive metabolic panel     Status: Abnormal   Collection Time: 05/20/14  6:00 AM   Result Value Ref Range   Sodium 135 (L) 137 - 147 mEq/L   Potassium 3.1 (L) 3.7 - 5.3 mEq/L   Chloride 98 96 - 112 mEq/L   CO2 25 19 - 32 mEq/L   Glucose, Bld 169 (H) 70 - 99 mg/dL   BUN 9 6 - 23 mg/dL   Creatinine, Ser 0.63 0.50 - 1.10 mg/dL   Calcium 8.4 8.4 - 10.5 mg/dL   Total Protein 5.9 (L) 6.0 - 8.3 g/dL   Albumin 2.5 (L) 3.5 - 5.2 g/dL   AST 10 0 - 37 U/L   ALT 17 0 - 35 U/L   Alkaline  Phosphatase 67 39 - 117 U/L   Total Bilirubin 0.3 0.3 - 1.2 mg/dL   GFR calc non Af Amer >90 >90 mL/min   GFR calc Af Amer >90 >90 mL/min   Anion gap 12 5 - 15  CBC     Status: Abnormal   Collection Time: 05/20/14  6:00 AM  Result Value Ref Range   WBC 7.1 4.0 - 10.5 K/uL   RBC 3.06 (L) 3.87 - 5.11 MIL/uL   Hemoglobin 9.3 (L) 12.0 - 15.0 g/dL   HCT 27.0 (L) 36.0 - 46.0 %   MCV 88.2 78.0 - 100.0 fL   MCH 30.4 26.0 - 34.0 pg   MCHC 34.4 30.0 - 36.0 g/dL   RDW 12.0 11.5 - 15.5 %   Platelets 110 (L) 150 - 400 K/uL     Dg Chest Port 1 View  05/19/2014   CLINICAL DATA:  Fever, weakness, hemoptysis.  Tongue cancer.  EXAM: PORTABLE CHEST - 1 VIEW  COMPARISON:  PET-CT dated 05/09/2014  FINDINGS: Lungs are essentially clear.  No pleural effusion or pneumothorax.  The heart is normal in size.  IMPRESSION: No evidence of acute cardiopulmonary disease.   Electronically Signed   By: Julian Hy M.D.   On: 05/19/2014 17:30    ROS:  As stated above in the HPI otherwise negative.  Blood pressure 129/42, pulse 99, temperature 98.9 F (37.2 C), temperature source Oral, resp. rate 16, height 5\' 7"  (1.702 m), weight 91.3 kg (201 lb 4.5 oz), SpO2 98 %.    PE: Gen: NAD, Alert and Oriented HEENT:  [place]/AT, EOMI Neck: Supple, right neck LAD - 2 cm lymph node Lungs: CTA Bilaterally CV: RRR without M/G/R ABM: Soft, NTND, +BS Ext: No C/C/E  Assessment/Plan: 1) Hemoptysis. 2) Hematmesis. 3) SCC of the base of the right tongue with oropharyngeal compromise.   From her clinical history it appears  to be that her hematemesis was secondary to swallowed blood.  She denies any history of GERD or PUD.  I think it is fine to keep her on a PPI at this time, but I believe the source of the bleeding is from her SCC.  If she does truly have an upper GI source of bleeding and an EGD is required, the procedure must be performed with anesthesia as a result of her SCC.  Plan: 1) Follow HGB. 2) Transfuse as necessary. 3) No GI intervention at this time.  Gaila Engebretsen D 05/20/2014, 7:05 AM

## 2014-05-21 ENCOUNTER — Encounter: Payer: Self-pay | Admitting: *Deleted

## 2014-05-21 ENCOUNTER — Ambulatory Visit: Payer: Self-pay | Admitting: Hematology and Oncology

## 2014-05-21 DIAGNOSIS — R197 Diarrhea, unspecified: Secondary | ICD-10-CM

## 2014-05-21 DIAGNOSIS — D696 Thrombocytopenia, unspecified: Secondary | ICD-10-CM

## 2014-05-21 DIAGNOSIS — E876 Hypokalemia: Secondary | ICD-10-CM

## 2014-05-21 DIAGNOSIS — R042 Hemoptysis: Secondary | ICD-10-CM

## 2014-05-21 DIAGNOSIS — E871 Hypo-osmolality and hyponatremia: Secondary | ICD-10-CM

## 2014-05-21 DIAGNOSIS — D649 Anemia, unspecified: Secondary | ICD-10-CM

## 2014-05-21 DIAGNOSIS — C01 Malignant neoplasm of base of tongue: Secondary | ICD-10-CM

## 2014-05-21 LAB — CBC
HCT: 21.4 % — ABNORMAL LOW (ref 36.0–46.0)
HEMATOCRIT: 21.5 % — AB (ref 36.0–46.0)
HEMOGLOBIN: 7.5 g/dL — AB (ref 12.0–15.0)
Hemoglobin: 7.4 g/dL — ABNORMAL LOW (ref 12.0–15.0)
MCH: 31 pg (ref 26.0–34.0)
MCH: 31.2 pg (ref 26.0–34.0)
MCHC: 34.9 g/dL (ref 30.0–36.0)
MCHC: 34.6 g/dL (ref 30.0–36.0)
MCV: 88.8 fL (ref 78.0–100.0)
MCV: 90.3 fL (ref 78.0–100.0)
PLATELETS: 104 10*3/uL — AB (ref 150–400)
Platelets: 106 10*3/uL — ABNORMAL LOW (ref 150–400)
RBC: 2.37 MIL/uL — ABNORMAL LOW (ref 3.87–5.11)
RBC: 2.42 MIL/uL — AB (ref 3.87–5.11)
RDW: 12.2 % (ref 11.5–15.5)
RDW: 12.3 % (ref 11.5–15.5)
WBC: 14.3 10*3/uL — ABNORMAL HIGH (ref 4.0–10.5)
WBC: 15.2 10*3/uL — ABNORMAL HIGH (ref 4.0–10.5)

## 2014-05-21 LAB — TYPE AND SCREEN
ABO/RH(D): O POS
Antibody Screen: NEGATIVE
Unit division: 0

## 2014-05-21 LAB — URINE CULTURE
CULTURE: NO GROWTH
Colony Count: NO GROWTH

## 2014-05-21 LAB — MAGNESIUM: MAGNESIUM: 1.7 mg/dL (ref 1.5–2.5)

## 2014-05-21 LAB — PROCALCITONIN: PROCALCITONIN: 0.39 ng/mL

## 2014-05-21 LAB — PREPARE RBC (CROSSMATCH)

## 2014-05-21 LAB — PHOSPHORUS: Phosphorus: 1.4 mg/dL — ABNORMAL LOW (ref 2.3–4.6)

## 2014-05-21 MED ORDER — K PHOS MONO-SOD PHOS DI & MONO 155-852-130 MG PO TABS
250.0000 mg | ORAL_TABLET | Freq: Two times a day (BID) | ORAL | Status: DC
  Administered 2014-05-21 – 2014-05-22 (×2): 250 mg via ORAL
  Filled 2014-05-21 (×2): qty 1

## 2014-05-21 MED ORDER — DEXTROSE 5 % IV SOLN
2.0000 g | Freq: Three times a day (TID) | INTRAVENOUS | Status: DC
  Administered 2014-05-21 – 2014-05-22 (×3): 2 g via INTRAVENOUS
  Filled 2014-05-21 (×4): qty 2

## 2014-05-21 MED ORDER — LOPERAMIDE HCL 2 MG PO CAPS
2.0000 mg | ORAL_CAPSULE | Freq: Three times a day (TID) | ORAL | Status: DC
  Administered 2014-05-21 – 2014-05-22 (×3): 2 mg via ORAL
  Filled 2014-05-21 (×4): qty 1

## 2014-05-21 MED ORDER — SODIUM CHLORIDE 0.9 % IV SOLN: Freq: Once | INTRAVENOUS | Status: DC

## 2014-05-21 NOTE — Progress Notes (Signed)
Patient ID: [name], female   DOB: 06/12/1956, 58 y.o.   MRN: [id number]  [facility] PROGRESS NOTE  [name] NWG:[id number] DOB: 08-Sep-1955 DOA: 05/19/2014 PCP: Simona Huh, MD  Brief narrative:   58 y.o. 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 Patient also had associated diarrhea that has lasted over past few days prior to this admission.  In ED, patient was found to be febrile with heart rate 123, respiratory rate 20, blood pressure 104/46, oxygen saturation 96% on room air. Blood work revealed hemoglobin of 9.4, neutrophils low at 1.3, platelets 93, potassium 3.3 and blood glucose 206. Chest x-ray on admission showed no evidence of acute cardiopulmonary disease. Patient was started on broad-spectrum antibiotics for possible febrile neutropenia and sepsis.  Assessment/Plan:    Principal problem: Febrile neutropenia - Likely secondary to sequela of recent chemotherapy. Patient has received Neulasta support 05/16/2014 - Neutrophil count on admission low at 1.3 normal white blood cell count - Continue current antibiotics, Azactam, Levaquin, vancomycin - Follow-up blood culture results, so far with no growth to date, stool for C. Difficile negative  - Follow-up CBC with differential tomorrow morning  Active problems: Sepsis - Sepsis criteria met on the admission with initial vitals that included tachycardia, hypotension, fever. In addition patient has procalcitonin level of 0.25. Unclear source of infection. Blood cultures pending but negative to date. C. difficile negative  Urinalysis on the admission did not show evidence of infection. Hypokalemia with Hypophosphatemia - supplement both electrolytes and repeat BMP and phos level in  AM Thrombocytopenia - from chemotherapy  - slight drop since admission - continue to monitor  - CBC in AM Hematemesis - Appreciate the GI consult and recommendations. So far no GI interventions planned.  - Patient is on Protonix drip as well as Protonix 40 mg IV every 12 hours.  - Diet regular. Off IVF Acute blood loss anemia imposed on anemia of chronic disease, cancer, DM - Hg drop since admission - transfuse one unit of PRBC 11/30 - repeat CBC in AM Cancer of base of tongue - Management per oncology. Last chemotherapy 05/11/2014 with Neulasta support  - appreciate Dr. Alvy Bimler help Diabetes mellitus type 2, controlled, without complications - Patient tolerates by mouth intake, resumed metformin  Essential hypertension - Due to soft blood pressure antihypertensives are on hold.  Dyslipidemia - Continue Lipitor daily Moderate malnutrition in the setting of chronic illness  - tolerating regular diet   DVT Prophylaxis  - stopped heparin subcutaneous and start SCDs bilaterally due to risk of bleeding   Code Status: Full.  Family Communication: plan of care discussed with the patient Disposition Plan: Home when stable. Transfer to telemetry today, order placed.  IV access:   Peripheral IV  Procedures and diagnostic studies:   Dg Chest Port 1 View 05/19/2014 No evidence of acute cardiopulmonary disease.   Medical Consultants:   GI (Dr. Carol Ada) Other Consultants:   None  IAnti-Infectives:    Azactam 05/19/2014 -->  Levaquin 05/19/2014 -->  Vancomycin 05/19/2014 -->  Leisa Lenz, MD  [facility] Pager [phone]  If 7PM-7AM, please contact night-coverage [email or web address] Password [email or web address] 05/21/2014, 3:26 PM   LOS: 2 days    HPI/Subjective: No acute overnight events.  Objective: Filed Vitals:   05/20/14 1256 05/20/14 2057 05/21/14 0723 05/21/14 1449  BP: 129/56 119/45 112/68 141/71  Pulse: 114 115 105 105  Temp: 98.2 F  (36.8 C) 99 F (37.2 C) 98.9 F (37.2 C) 98.2 F (36.8 C)  TempSrc: Oral Oral Oral Axillary  Resp: '18 20 20 20  ' Height:      Weight:      SpO2: 92% 99% 94% 98%    Intake/Output Summary (Last 24 hours) at 05/21/14 1526 Last data filed at 05/21/14 0600  Gross per 24 hour  Intake   1225 ml  Output    251 ml  Net    974 ml    Exam:   General:  Pt is alert, follows commands appropriately, not in acute distress  Cardiovascular: Regular rhythm, tachy, S1/S2, no murmurs  Respiratory: Clear to auscultation bilaterally, no wheezing, no crackles, no rhonchi  Abdomen: Soft, non tender, non distended, bowel sounds present  Extremities: No edema, pulses DP and PT palpable bilaterally  Neuro: Grossly nonfocal  Data Reviewed: Basic Metabolic Panel:  Recent Labs Lab 05/18/14 1413 05/19/14 1538 05/20/14 0600 05/21/14 0500  NA 135* 134* 135*  --   K 3.9 3.3* 3.1*  --   CL 95* 93* 98  --   CO2 '26 25 25  ' --   GLUCOSE 177* 206* 169*  --   BUN '19 9 9  ' --   CREATININE 0.60 0.78 0.63  --   CALCIUM 9.2 9.3 8.4  --   MG  --   --  1.7 1.7  PHOS  --   --  1.5* 1.4*   Liver Function Tests:  Recent Labs Lab 05/18/14 1413 05/19/14 1538 05/20/14 0600  AST '16 10 10  ' ALT 37* 25 17  ALKPHOS 85 76 67  BILITOT 0.5 0.5 0.3  PROT 7.4 7.3 5.9*  ALBUMIN 3.4* 3.1* 2.5*    Recent Labs Lab 05/19/14 1538  LIPASE 10*   CBC:  Recent Labs Lab 05/18/14 1413 05/19/14 1538  05/20/14 0600 05/20/14 1230 05/20/14 1837 05/21/14 0030 05/21/14 0500  WBC 1.1* 3.1*  < > 7.1 10.0 12.5* 14.3* 15.2*  NEUTROABS 0.1* 1.3*  --   --   --   --   --   --   HGB 11.7* 11.1*  < > 9.3* 9.3* 8.0* 7.5* 7.4*  HCT 34.0* 32.2*  < > 27.0* 26.4* 23.0* 21.5* 21.4*  MCV 88.8 89.4  < > 88.2 88.3 89.8 88.8 90.3  PLT 126* 104*  < > 110* 118* 105* 106* 104*  < > = values in this interval not displayed. Cardiac Enzymes:  Recent Labs Lab 05/20/14 0600  TROPONINI <0.30    Recent Results (from the past  240 hour(s))  Blood Culture (routine x 2)     Status: None (Preliminary result)   Collection Time: 05/19/14  4:27 PM  Result Value Ref Range Status   Specimen Description BLOOD LEFT WRIST  Final   Special Requests BOTTLES DRAWN AEROBIC AND ANAEROBIC [facility] EACH  Final   Culture  Setup Time   Final    05/19/2014 21:18 Performed at [facility]    Culture   Final           BLOOD CULTURE RECEIVED NO GROWTH TO DATE CULTURE WILL BE HELD FOR 5 DAYS BEFORE ISSUING A FINAL NEGATIVE REPORT Performed at [facility]    Report Status PENDING  Incomplete  Blood Culture (routine x 2)     Status: None (Preliminary result)   Collection Time: 05/19/14  4:27 PM  Result Value Ref Range Status   Specimen Description BLOOD RIGHT ANTECUBITAL  Final   Special Requests BOTTLES DRAWN AEROBIC AND ANAEROBIC 5CC EACH  Final   Culture  Setup Time   Final    05/19/2014 21:18 Performed at [facility]    Culture   Final           BLOOD CULTURE RECEIVED NO GROWTH TO DATE CULTURE WILL BE HELD FOR 5 DAYS BEFORE ISSUING A FINAL NEGATIVE REPORT Performed at [facility]    Report Status PENDING  Incomplete  Urine culture     Status: None   Collection Time: 05/19/14  5:52 PM  Result Value Ref Range Status   Specimen Description URINE, CLEAN CATCH  Final   Special Requests NONE  Final   Culture  Setup Time   Final    05/20/2014 02:00 Performed at [place] NO GROWTH Performed at [facility]   Final   Culture NO GROWTH Performed at [facility]   Final   Report Status 05/21/2014 FINAL  Final  MRSA PCR Screening     Status: None   Collection Time: 05/19/14  9:28 PM  Result Value Ref Range Status   MRSA by PCR NEGATIVE NEGATIVE Final    Comment:        The GeneXpert MRSA Assay (FDA approved for NASAL specimens only), is one component of a comprehensive MRSA colonization surveillance program. It is not intended to diagnose  MRSA infection nor to guide or monitor treatment for MRSA infections.   Clostridium Difficile by PCR     Status: None   Collection Time: 05/20/14  5:50 AM  Result Value Ref Range Status   C difficile by pcr NEGATIVE NEGATIVE Final    Comment: Performed at [facility]  Stool culture     Status: None (Preliminary result)   Collection Time: 05/20/14  2:27 PM  Result Value Ref Range Status   Specimen Description STOOL  Final   Special Requests Immunocompromised  Final   Culture   Final    Culture reincubated for better growth Performed at [facility]    Report Status PENDING  Incomplete     Scheduled Meds: . atorvastatin  10 mg Oral Daily  . aztreonam  2 g Intravenous Q8H  . levofloxacin (LEVAQUIN) IV  750 mg Intravenous Q24H  . loperamide  2 mg Oral TID  . metFORMIN  500 mg Oral Q breakfast  . [START ON 05/23/2014] pantoprazole (PROTONIX) IV  40 mg Intravenous Q12H  . sodium chloride  3 mL Intravenous Q12H  . vancomycin  1,000 mg Intravenous Q12H   Continuous Infusions: . pantoprozole (PROTONIX) infusion 8 mg/hr (05/21/14 0339)

## 2014-05-21 NOTE — Progress Notes (Signed)
To provide support and encouragement, care continuity and to assess for needs, visited patient in [place].  She provided recap of weekend events and her visits to [facility] ED and her eventual admission.  She denied any needs/concerns, thanked me for my visit.  I will continue to follow.  [name], RN, BSN, [place] at [place] [phone]

## 2014-05-21 NOTE — Plan of Care (Signed)
Problem: Phase II Progression Outcomes Goal: Progress activity as tolerated unless otherwise ordered Outcome: Completed/Met Date Met:  05/21/14

## 2014-05-21 NOTE — Care Management Note (Addendum)
    Page 1 of 1   05/22/2014     2:13:35 PM CARE MANAGEMENT NOTE 05/22/2014  Patient:  [name], [name]   Account Number:  [id number]  Date Initiated:  05/21/2014  Documentation initiated by:  Dessa Phi  Subjective/Objective Assessment:   58 Y/O F ADMITTED W/NEUTROPENIC FEVER.EH:MCNOBS CA,RECENT CHEMO.     Action/Plan:   FROM HOME.   Anticipated DC Date:  05/22/2014   Anticipated DC Plan:  [place]  CM consult      Choice offered to / List presented to:             Status of service:  Completed, signed off Medicare Important Message given?   (If response is "NO", the following Medicare IM given date fields will be blank) Date Medicare IM given:   Medicare IM given by:   Date Additional Medicare IM given:   Additional Medicare IM given by:    Discharge Disposition:  HOME/SELF CARE  Per UR Regulation:  Reviewed for med. necessity/level of care/duration of stay  If discussed at [place] of Stay Meetings, dates discussed:    Comments:  05/22/14 Ikram Riebe RN,BSN NCM [phone] D/C [place].  05/21/14 Hero Kulish RN,BSN NCM [place] ANTICIP[place] D/C NEEDS.

## 2014-05-21 NOTE — Progress Notes (Signed)
[facility] NOTE  Patient Care Team: Gaynelle Arabian, MD as PCP - General (Family Medicine) Brooks Sailors, RN as Oncology Nurse [place], RD as Dietitian (Nutrition) Eppie Gibson, MD as Attending Physician (Radiation Oncology)  CHIEF COMPLAINTS/PURPOSE OF CONSULTATION:  Recurrent hemoptysis  HISTORY OF PRESENTING ILLNESS:  [name] 58 y.o. female is here because of recurrent hemoptysis. The patient have back or history of advanced tongue cancer, undergoing chemotherapy. Summary of oncologic history is as follows Oncology History   Cancer of base of tongue   Staging form: Lip and Oral Cavity, AJCC 7th Edition     Clinical: Stage IVA (T3, N2b, M0) - Signed by Heath Lark, MD on 05/09/2014       Cancer of base of tongue   04/20/2014 Imaging CT neck showed advanced base of tongue mass and multiple right neck lymphadenopathy   04/23/2014 Pathology Results [id number] FNA of right neck is positive for squamous cell cancer, P16 positive   05/09/2014 Imaging PET/CT scan confirmed base of tongue cancer along with lymph node metastasis.   05/10/2014 Procedure She has placement of PICC line.   05/11/2014 -  Chemotherapy She received induction chemotherapy with Taxotere, cisplatin and 5-FU.   She started having recurrent hemoptysis last week. She say at most in the amount of hemoptysis was half a cup full. She also had profuse frequent bowel movements since discontinuation of chemotherapy. She was brought into the emergency department discharged home and then readmitted because of ongoing hemoptysis. She also complained of some melana. She denies ongoing bleeding such as hemoptysis, epistaxis, hematuria or hematochezia since admission. She denies any cough. Appetite is good.  MEDICAL HISTORY:  Past Medical History  Diagnosis Date  . Hypertension   . Diabetes mellitus without complication     "borderline diabetic"   . Hypercholesteremia   .  Diabetes mellitus type 2, controlled, without complications 16/03/9603  . Cancer of tongue     SURGICAL HISTORY: Past Surgical History  Procedure Laterality Date  . Cervical ablation      Uterus  . Nasal fracture surgery  5409    Dr. Erik Obey    SOCIAL HISTORY: History   Social History  . Marital Status: Married    Spouse Name: N/A    Number of Children: 1  . Years of Education: N/A   Occupational History  . Not on file.   Social History Main Topics  . Smoking status: Former Smoker -- 0.50 packs/day for 5 years    Types: Cigarettes    Quit date: 06/22/1974  . Smokeless tobacco: Never Used  . Alcohol Use: 0.0 oz/week    0 Not specified per week     Comment: occl wine  . Drug Use: No  . Sexual Activity: Not on file   Other Topics Concern  . Not on file   Social History Narrative    FAMILY HISTORY: Family History  Problem Relation Age of Onset  . Hepatitis C Mother   . Heart failure Father   . Cancer Sister     melanoma  . Cancer Brother     melanoma  . Cancer Paternal Grandfather     colon ca    ALLERGIES:  is allergic to penicillins.  MEDICATIONS:  Current Facility-Administered Medications  Medication Dose Route Frequency Provider Last Rate Last Dose  . acetaminophen (TYLENOL) tablet 650 mg  650 mg Oral Q6H PRN Toy Baker, MD       Or  .  acetaminophen (TYLENOL) suppository 650 mg  650 mg Rectal Q6H PRN Toy Baker, MD      . ALPRAZolam Duanne Moron) tablet 0.25 mg  0.25 mg Oral PRN Toy Baker, MD   0.25 mg at 05/20/14 2136  . atorvastatin (LIPITOR) tablet 10 mg  10 mg Oral Daily Toy Baker, MD   10 mg at 05/20/14 1749  . aztreonam (AZACTAM) 2 g in dextrose 5 % 50 mL IVPB  2 g Intravenous Q8H Luiz Ochoa, RPH      . levofloxacin (LEVAQUIN) IVPB 750 mg  750 mg Intravenous Q24H Minda Ditto, RPH   750 mg at 05/20/14 1705  . loperamide (IMODIUM) capsule 2 mg  2 mg Oral TID Heath Lark, MD   2 mg at 05/21/14 0915  . metFORMIN  (GLUCOPHAGE) tablet 500 mg  500 mg Oral Q breakfast Robbie Lis, MD   500 mg at 05/21/14 0754  . ondansetron (ZOFRAN) tablet 4 mg  4 mg Oral Q6H PRN Toy Baker, MD       Or  . ondansetron (ZOFRAN) injection 4 mg  4 mg Intravenous Q6H PRN Toy Baker, MD      . pantoprazole (PROTONIX) 80 mg in sodium chloride 0.9 % 250 mL (0.32 mg/mL) infusion  8 mg/hr Intravenous Continuous Toy Baker, MD 25 mL/hr at 05/21/14 0339 8 mg/hr at 05/21/14 0339  . [START ON 05/23/2014] pantoprazole (PROTONIX) injection 40 mg  40 mg Intravenous Q12H Anastassia Doutova, MD      . sodium chloride 0.9 % injection 10-40 mL  10-40 mL Intracatheter PRN Robbie Lis, MD   10 mL at 05/21/14 0033  . sodium chloride 0.9 % injection 3 mL  3 mL Intravenous Q12H Toy Baker, MD   3 mL at 05/19/14 2323  . vancomycin (VANCOCIN) IVPB 1000 mg/200 mL premix  1,000 mg Intravenous Q12H Minda Ditto, RPH   1,000 mg at 05/21/14 9924    REVIEW OF SYSTEMS:   Constitutional: Denies fevers, chills or abnormal night sweats Eyes: Denies blurriness of vision, double vision or watery eyes Ears, nose, mouth, throat, and face: Denies mucositis or sore throat Respiratory: Denies cough, dyspnea or wheezes Cardiovascular: Denies palpitation, chest discomfort or lower extremity swelling Skin: Denies abnormal skin rashes Lymphatics: Denies new lymphadenopathy or easy bruising Neurological:Denies numbness, tingling or new weaknesses Behavioral/Psych: Mood is stable, no new changes  All other systems were reviewed with the patient and are negative.  PHYSICAL EXAMINATION: ECOG PERFORMANCE STATUS: 2 - Symptomatic, <50% confined to bed  Filed Vitals:   05/21/14 0723  BP: 112/68  Pulse: 105  Temp: 98.9 F (37.2 C)  Resp: 20   Filed Weights   05/19/14 1635 05/19/14 2130 05/20/14 0400  Weight: 197 lb (89.359 kg) 201 lb 4.5 oz (91.3 kg) 201 lb 4.5 oz (91.3 kg)    GENERAL:alert, no distress and comfortable SKIN:  skin color, texture, turgor are normal, no rashes or significant lesions EYES: normal, conjunctiva are pink and non-injected, sclera clear OROPHARYNX:no exudate, no erythema and lips, buccal mucosa, and tongue normal  NECK: supple, thyroid normal size, non-tender, without nodularity LYMPH:  Previously palpable lymphadenopathy has resolved. LUNGS: clear to auscultation and percussion with normal breathing effort HEART: regular rate & rhythm and no murmurs and no lower extremity edema ABDOMEN:abdomen soft, non-tender and normal bowel sounds Musculoskeletal:no cyanosis of digits and no clubbing  PSYCH: alert & oriented x 3 with fluent speech NEURO: no focal motor/sensory deficits  LABORATORY DATA:  I have  reviewed the data as listed Lab Results  Component Value Date   WBC 15.2* 05/21/2014   HGB 7.4* 05/21/2014   HCT 21.4* 05/21/2014   MCV 90.3 05/21/2014   PLT 104* 05/21/2014    Recent Labs  05/18/14 1413 05/19/14 1538 05/20/14 0600  NA 135* 134* 135*  K 3.9 3.3* 3.1*  CL 95* 93* 98  CO2 _0 GLUCOSE 177* 206* 169*  BUN _1 CREATININE 0.60 0.78 0.63  CALCIUM 9.2 9.3 8.4  GFRNONAA >90 90* >90  GFRAA >90 >90 >90  PROT 7.4 7.3 5.9*  ALBUMIN 3.4* 3.1* 2.5*  AST _2 ALT 37* 25 17  ALKPHOS 85 76 67  BILITOT 0.5 0.5 0.3    RADIOGRAPHIC STUDIES: I have personally reviewed the radiological images as listed and agreed with the findings in the report. US Soft Tissue Head/neck  05/03/2014   CLINICAL DATA:  Right thyroid nodule by CT.  EXAM: THYROID ULTRASOUND  TECHNIQUE: Ultrasound examination of the thyroid gland and adjacent soft tissues was performed.  COMPARISON:  04/20/2014  FINDINGS: Right thyroid lobe  Measurements: 5.7 x 2.4 x 3.1 cm. Diffusely heterogeneous mildly enlarged right thyroid lobe. Normal vascularity. Small solid well-circumscribed right lower pole nodule only measures 13 x 10 x 12 mm. No other significant focal right thyroid abnormality.  Left  thyroid lobe  Measurements: 5.0 x 1.7 x 1.8 cm. Heterogeneous thyroid echotexture without a discrete nodule or mass.  Isthmus  Thickness: 7.5 mm.  No nodules visualized.  Lymphadenopathy  Abnormal right cervical adenopathy which correlates with the neck CT, concerning for metastatic right cervical nodes related to the posterior base of tongue mass.  IMPRESSION: Heterogeneous asymmetric enlargement of the right thyroid lobe diffusely.  13 mm right lower pole incidental thyroid nodule.  Right cervical adenopathy, as above. This has already been biopsied by ENT.  Findings do not meet current SRU consensus criteria for biopsy. Follow-up by clinical exam is recommended. If patient has known risk factors for thyroid carcinoma, consider follow-up ultrasound in 12 months. If patient is clinically hyperthyroid, consider nuclear medicine thyroid uptake and scan.Reference: Management of Thyroid Nodules Detected at [place]: Society of Radiologists in [place]. Radiology 2005; [id number].   Electronically Signed   By: Daryll Brod M.D.   On: 05/03/2014 16:27   Nm Pet Image Initial (pi) Skull Base To Thigh  05/09/2014   CLINICAL DATA:  Initial treatment strategy for head neck cancer.  EXAM: NUCLEAR MEDICINE PET SKULL BASE TO THIGH  TECHNIQUE: 9.6 mCi F-18 FDG was injected intravenously. Full-ring PET imaging was performed from the skull base to thigh after the radiotracer. CT data was obtained and used for attenuation correction and anatomic localization.  FASTING BLOOD GLUCOSE:  Value: 147 mg/dl  COMPARISON:  CT neck 04/20/2014  FINDINGS: NECK  There is intense FDG uptake associated with the large tongue base mass. The mass measures approximately 3.9 x 3.8 cm and has an SUV max equal to 13.5. Hypermetabolic right level 2 cervical node measures 1.4 x 0.9 cm and has an SUV max equal to 2.3. Right-sided level 3 lymph node measures 2 x 2.3 cm and has an SUV max equal to 7.4. No hypermetabolic left  cervical lymph nodes. Nodule within the right lobe of thyroid gland extending into the isthmus measures 2.8 cm and has an SUV max equal to 4.1.  CHEST  No hypermetabolic mediastinal or hilar nodes. No suspicious pulmonary nodules on the CT  scan.  ABDOMEN/PELVIS  No abnormal hypermetabolic activity within the liver, pancreas, adrenal glands, or spleen. Gallstone identified. No hypermetabolic lymph nodes in the abdomen or pelvis.  SKELETON  No focal hypermetabolic activity to suggest skeletal metastasis.  IMPRESSION: 1. There is intense FDG uptake associated with the large tongue base mass concerning for primary malignancy. 2. Malignant range FDG uptake is associated with the metastatic right level 2 and 3 lymph nodes. 3. No evidence for metastatic disease to the chest abdomen or pelvis.   Electronically Signed   By: Kerby Moors M.D.   On: 05/09/2014 10:35   Ir Fluoro Guide Cv Line Right  05/10/2014   INDICATION: Cancer of base of tongue; central venous access is requested for chemotherapy .  EXAM: ULTRASOUND AND FLUOROSCOPIC GUIDED PICC LINE INSERTION  MEDICATIONS: None.  CONTRAST:  None  FLUOROSCOPY TIME:  30 seconds.  COMPLICATIONS: None immediate  TECHNIQUE: The procedure, risks, benefits, and alternatives were explained to the patient and informed written consent was obtained. A timeout was performed prior to the initiation of the procedure.  The right upper extremity was prepped with chlorhexidine in a sterile fashion, and a sterile drape was applied covering the operative field. Maximum barrier sterile technique with sterile gowns and gloves were used for the procedure. A timeout was performed prior to the initiation of the procedure. Local anesthesia was provided with 1% lidocaine.  Under direct ultrasound guidance, the right basilic vein was accessed with a micropuncture kit after the overlying soft tissues were anesthetized with 1% lidocaine. An ultrasound image was saved for documentation purposes. A  guidewire was advanced to the level of the superior caval-atrial junction for measurement purposes and the PICC line was cut to length. A peel-away sheath was placed and a 40 cm, 5 [place], dual lumen was inserted to level of the superior caval-atrial junction. A post procedure spot fluoroscopic was obtained. The catheter easily aspirated and flushed and was sutured in place. A dressing was placed. The patient tolerated the procedure well without immediate post procedural complication.  FINDINGS: After catheter placement, the tip lies within the superior cavoatrial junction. The catheter aspirates and flushes normally and is ready for immediate use.  IMPRESSION: Successful ultrasound and fluoroscopic guided placement of a right basilic vein approach, 40 cm, 5 French, dual lumen PICC with tip at the superior caval-atrial junction. The PICC line is ready for immediate use.  Read by: Rowe Robert, PA-C   Electronically Signed   By: Markus Daft M.D.   On: 05/10/2014 10:08   Ir US Guide Vasc Access Right  05/15/2014   INDICATION: Cancer of base of tongue; central venous access is requested for chemotherapy .  EXAM: ULTRASOUND AND FLUOROSCOPIC GUIDED PICC LINE INSERTION  MEDICATIONS: None.  CONTRAST:  None  FLUOROSCOPY TIME:  30 seconds.  COMPLICATIONS: None immediate  TECHNIQUE: The procedure, risks, benefits, and alternatives were explained to the patient and informed written consent was obtained. A timeout was performed prior to the initiation of the procedure.  The right upper extremity was prepped with chlorhexidine in a sterile fashion, and a sterile drape was applied covering the operative field. Maximum barrier sterile technique with sterile gowns and gloves were used for the procedure. A timeout was performed prior to the initiation of the procedure. Local anesthesia was provided with 1% lidocaine.  Under direct ultrasound guidance, the right basilic vein was accessed with a micropuncture kit after the  overlying soft tissues were anesthetized with 1% lidocaine. An ultrasound image  was saved for documentation purposes. A guidewire was advanced to the level of the superior caval-atrial junction for measurement purposes and the PICC line was cut to length. A peel-away sheath was placed and a 40 cm, 5 [place], dual lumen was inserted to level of the superior caval-atrial junction. A post procedure spot fluoroscopic was obtained. The catheter easily aspirated and flushed and was sutured in place. A dressing was placed. The patient tolerated the procedure well without immediate post procedural complication.  FINDINGS: After catheter placement, the tip lies within the superior cavoatrial junction. The catheter aspirates and flushes normally and is ready for immediate use.  IMPRESSION: Successful ultrasound and fluoroscopic guided placement of a right basilic vein approach, 40 cm, 5 French, dual lumen PICC with tip at the superior caval-atrial junction. The PICC line is ready for immediate use.  Read by: Rowe Robert, PA-C   Electronically Signed   By: Markus Daft M.D.   On: 05/10/2014 10:08   Dg Chest Port 1 View  05/19/2014   CLINICAL DATA:  Fever, weakness, hemoptysis.  Tongue cancer.  EXAM: PORTABLE CHEST - 1 VIEW  COMPARISON:  PET-CT dated 05/09/2014  FINDINGS: Lungs are essentially clear.  No pleural effusion or pneumothorax.  The heart is normal in size.  IMPRESSION: No evidence of acute cardiopulmonary disease.   Electronically Signed   By: Julian Hy M.D.   On: 05/19/2014 17:30    ASSESSMENT & PLAN:  #1 tongue cancer She has excellent response to treatment clinically. Continue aggressive supportive care. #2 anemia This is due to ongoing bleeding. I recommend transfusion if she has symptoms of anemia now or to transfuse empirically with hemoglobin less than 7 g. #3 hemoptysis This is not an unexpected event due to presence of locally advanced tumor. I suspect this is just bleeding from necrotic  tumor. Continue supportive care. #4 hyponatremia with hypokalemia This is likely due to GI losses. Recommend IV fluid replacement and potassium replacement #5 mild diarrhea This could be due to melana causing diarrhea or side effects of 5-FU treatment. Recommend scheduling Imodium around the clock. #6 DVT prophylaxis Recommend holding off Lovenox in the presence of recent bleeding. #7 thrombocytopenia This is likely due to recent chemotherapy and consumption from bleeding. No need transfusions unless platelet count dropped to less than 20,000. Recommend close observation #8 mild leukocytosis Clinically, she does not appear to be infected. She had received Neulasta recently. If all cultures were negative over the next 24 hours, recommend discontinuation of antibiotic therapy. #9 CODE STATUS Full code #10 discharge planning Recommend close observation for the next 24 for 48 hours and discharged home if bleeding has stopped.  All questions were answered. The patient knows to call the clinic with any problems, questions or concerns.    [facility], [place], MD 05/21/2014 2:39 PM

## 2014-05-21 NOTE — Progress Notes (Addendum)
ANTIBIOTIC CONSULT NOTE - FOLLOW-UP  Pharmacy Consult for Vancomycin/Aztreonam/ Levofloxacin Indication: rule out sepsis, possible febrile neutropenia  Allergies  Allergen Reactions  . Penicillins Hives and Other (See Comments)    "Tongue swelling"   Patient Measurements: Height: 5\' 7"  (170.2 cm) Weight: 201 lb 4.5 oz (91.3 kg) IBW/kg (Calculated) : 61.6  Vital Signs: Temp: 98.9 F (37.2 C) (11/30 0723) Temp Source: Oral (11/30 0723) BP: 112/68 mmHg (11/30 0723) Pulse Rate: 105 (11/30 0723) Intake/Output from previous day: 11/29 0701 - 11/30 0700 In: 2725 [P.O.:600; I.V.:1425; IV Piggyback:700] Out: 853 [Urine:850; Stool:3] Intake/Output from this shift:    Labs:  Recent Labs  05/18/14 1413 05/19/14 1538  05/20/14 0600  05/20/14 1837 05/21/14 0030 05/21/14 0500  WBC 1.1* 3.1*  < > 7.1  < > 12.5* 14.3* 15.2*  HGB 11.7* 11.1*  < > 9.3*  < > 8.0* 7.5* 7.4*  PLT 126* 104*  < > 110*  < > 105* 106* 104*  CREATININE 0.60 0.78  --  0.63  --   --   --   --   < > = values in this interval not displayed. Estimated Creatinine Clearance: 87.9 mL/min (by C-G formula based on Cr of 0.63). No results for input(s): VANCOTROUGH, VANCOPEAK, VANCORANDOM, GENTTROUGH, GENTPEAK, GENTRANDOM, TOBRATROUGH, TOBRAPEAK, TOBRARND, AMIKACINPEAK, AMIKACINTROU, AMIKACIN in the last 72 hours.   Microbiology: Recent Results (from the past 720 hour(s))  Blood Culture (routine x 2)     Status: None (Preliminary result)   Collection Time: 05/19/14  4:27 PM  Result Value Ref Range Status   Specimen Description BLOOD LEFT WRIST  Final   Special Requests BOTTLES DRAWN AEROBIC AND ANAEROBIC [facility] EACH  Final   Culture  Setup Time   Final    05/19/2014 21:18 Performed at [facility]    Culture   Final           BLOOD CULTURE RECEIVED NO GROWTH TO DATE CULTURE WILL BE HELD FOR 5 DAYS BEFORE ISSUING A FINAL NEGATIVE REPORT Performed at [facility]    Report Status PENDING  Incomplete   Blood Culture (routine x 2)     Status: None (Preliminary result)   Collection Time: 05/19/14  4:27 PM  Result Value Ref Range Status   Specimen Description BLOOD RIGHT ANTECUBITAL  Final   Special Requests BOTTLES DRAWN AEROBIC AND ANAEROBIC 5CC EACH  Final   Culture  Setup Time   Final    05/19/2014 21:18 Performed at [facility]    Culture   Final           BLOOD CULTURE RECEIVED NO GROWTH TO DATE CULTURE WILL BE HELD FOR 5 DAYS BEFORE ISSUING A FINAL NEGATIVE REPORT Performed at [facility]    Report Status PENDING  Incomplete  Urine culture     Status: None   Collection Time: 05/19/14  5:52 PM  Result Value Ref Range Status   Specimen Description URINE, CLEAN CATCH  Final   Special Requests NONE  Final   Culture  Setup Time   Final    05/20/2014 02:00 Performed at [place] Performed at [facility]   Final   Culture NO GROWTH Performed at [facility]   Final   Report Status 05/21/2014 FINAL  Final  MRSA PCR Screening     Status: None   Collection Time: 05/19/14  9:28 PM  Result Value Ref Range Status  MRSA by PCR NEGATIVE NEGATIVE Final    Comment:        The GeneXpert MRSA Assay (FDA approved for NASAL specimens only), is one component of a comprehensive MRSA colonization surveillance program. It is not intended to diagnose MRSA infection nor to guide or monitor treatment for MRSA infections.   Clostridium Difficile by PCR     Status: None   Collection Time: 05/20/14  5:50 AM  Result Value Ref Range Status   C difficile by pcr NEGATIVE NEGATIVE Final    Comment: Performed at [facility]  Stool culture     Status: None (Preliminary result)   Collection Time: 05/20/14  2:27 PM  Result Value Ref Range Status   Specimen Description STOOL  Final   Special Requests Immunocompromised  Final   Culture   Final    Culture reincubated for better growth Performed at [facility]    Report Status PENDING  Incomplete    Medical History: Past Medical History  Diagnosis Date  . Hypertension   . Diabetes mellitus without complication     "borderline diabetic"   . Hypercholesteremia   . Diabetes mellitus type 2, controlled, without complications 32/20/2542  . Cancer of tongue    Assessment: 63 yoF with hx of tongue Ca: completed Cycle 1 of Docetaxel/Cisplatin/5Fu 11/20-25, with Neulasta. In ED 11/27 for vomiting, stated hemoptysis; ENT MD suggested 1/2 water/Peroxide gargle, patient left without notifying staff. Returned to ED 11/28 with vomiting, hemoptysis, febrile to 101.1, WBC 3.1K with ANC 0.1  Vancomycin, Aztreonam (PCN allergy), Levofloxacin per pharmacy for r/o sepsis, possible febrile neutropenia   11/28 >> Vancomycin  >> 11/28 >> Aztreonam >>  11/28 >> Levofloxacin >>   Tmax: 99 F WBCs: elevated, trending up, 15.2 Renal: stable, SCr 0.63 (from 11/29), CrCl ~ 88 mL/min CG PCT: 0.39 (0.25, 0.18)  11/28 blood x 2: NGTD 11/28 urine: NGF  11/28 MRSA PCR: negative 11/29 C.diff PCR: negative   Goal of Therapy:  Vancomycin trough level 15-20 mcg/ml  Appropriate antibiotic dosing for renal function and indication Eradication of infection  Plan:   Check Vancomycin trough level prior to 2000 dose tonight and adjust therapy as indicated.  Increase Aztreonam to 2 grams IV q8h given rising WBC, PCT  Continue Levofloxacin 750 mg IV q24h  Thank you for the consult.   Lindell Spar, PharmD, BCPS Pager: [phone] 05/21/2014 8:29 AM   Addendum:  As patient's renal function remains stable and MD notes patient clinically does not appear infected and consider d/c of abx in next 24 hours, will defer checking Vancomycin trough level tonight.  Plan to re-evaluate in AM and order as necessary per clinical course.   Lindell Spar, PharmD, BCPS Pager: [phone] 05/21/2014 2:56 PM

## 2014-05-22 ENCOUNTER — Other Ambulatory Visit: Payer: Self-pay | Admitting: Hematology and Oncology

## 2014-05-22 ENCOUNTER — Telehealth: Payer: Self-pay | Admitting: Hematology and Oncology

## 2014-05-22 DIAGNOSIS — D619 Aplastic anemia, unspecified: Secondary | ICD-10-CM

## 2014-05-22 DIAGNOSIS — I10 Essential (primary) hypertension: Secondary | ICD-10-CM

## 2014-05-22 DIAGNOSIS — D72829 Elevated white blood cell count, unspecified: Secondary | ICD-10-CM

## 2014-05-22 DIAGNOSIS — C029 Malignant neoplasm of tongue, unspecified: Secondary | ICD-10-CM

## 2014-05-22 DIAGNOSIS — K921 Melena: Secondary | ICD-10-CM

## 2014-05-22 LAB — BASIC METABOLIC PANEL
Anion gap: 11 (ref 5–15)
BUN: 5 mg/dL — AB (ref 6–23)
CHLORIDE: 101 meq/L (ref 96–112)
CO2: 26 mEq/L (ref 19–32)
CREATININE: 0.66 mg/dL (ref 0.50–1.10)
Calcium: 8.1 mg/dL — ABNORMAL LOW (ref 8.4–10.5)
GFR calc non Af Amer: >90 mL/min (ref >90)
Glucose, Bld: 144 mg/dL — ABNORMAL HIGH (ref 70–99)
Potassium: 3 mEq/L — ABNORMAL LOW (ref 3.7–5.3)
Sodium: 138 mEq/L (ref 137–147)

## 2014-05-22 LAB — TYPE AND SCREEN
ABO/RH(D): O POS
Antibody Screen: NEGATIVE
UNIT DIVISION: 0

## 2014-05-22 LAB — MAGNESIUM: Magnesium: 1.6 mg/dL (ref 1.5–2.5)

## 2014-05-22 LAB — CBC WITH DIFFERENTIAL/PLATELET
BASOS ABS: 0 10*3/uL (ref 0.0–0.1)
Basophils Relative: 0 % (ref 0–1)
EOS ABS: 0.1 10*3/uL (ref 0.0–0.7)
Eosinophils Relative: 1 % (ref 0–5)
HCT: 22.6 % — ABNORMAL LOW (ref 36.0–46.0)
Hemoglobin: 7.7 g/dL — ABNORMAL LOW (ref 12.0–15.0)
LYMPHS ABS: 3.3 10*3/uL (ref 0.7–4.0)
Lymphocytes Relative: 23 % (ref 12–46)
MCH: 29.8 pg (ref 26.0–34.0)
MCHC: 34.1 g/dL (ref 30.0–36.0)
MCV: 87.6 fL (ref 78.0–100.0)
MONO ABS: 2.1 10*3/uL — AB (ref 0.1–1.0)
MONOS PCT: 15 % — AB (ref 3–12)
NEUTROS ABS: 8.7 10*3/uL — AB (ref 1.7–7.7)
Neutrophils Relative %: 61 % (ref 43–77)
Platelets: 111 10*3/uL — ABNORMAL LOW (ref 150–400)
RBC: 2.58 MIL/uL — ABNORMAL LOW (ref 3.87–5.11)
RDW: 13.8 % (ref 11.5–15.5)
WBC: 14.2 10*3/uL — ABNORMAL HIGH (ref 4.0–10.5)

## 2014-05-22 LAB — FECAL LACTOFERRIN, QUANT: Fecal Lactoferrin: POSITIVE

## 2014-05-22 LAB — PHOSPHORUS: Phosphorus: 2.2 mg/dL — ABNORMAL LOW (ref 2.3–4.6)

## 2014-05-22 MED ORDER — CIPROFLOXACIN HCL 500 MG PO TABS: 500.0000 mg | ORAL_TABLET | Freq: Two times a day (BID) | ORAL | Status: DC

## 2014-05-22 MED ORDER — HEPARIN SOD (PORK) LOCK FLUSH 100 UNIT/ML IV SOLN
250.0000 [IU] | INTRAVENOUS | Status: AC | PRN
  Administered 2014-05-22 (×2): 250 [IU]

## 2014-05-22 MED ORDER — K PHOS MONO-SOD PHOS DI & MONO 155-852-130 MG PO TABS: 250.0000 mg | ORAL_TABLET | Freq: Two times a day (BID) | ORAL | Status: DC

## 2014-05-22 MED ORDER — POTASSIUM CHLORIDE CRYS ER 20 MEQ PO TBCR
40.0000 meq | EXTENDED_RELEASE_TABLET | Freq: Once | ORAL | Status: AC
  Administered 2014-05-22: 40 meq via ORAL
  Filled 2014-05-22: qty 2

## 2014-05-22 MED ORDER — PANTOPRAZOLE SODIUM 40 MG PO TBEC: 40.0000 mg | DELAYED_RELEASE_TABLET | Freq: Every day | ORAL | Status: DC

## 2014-05-22 NOTE — Discharge Instructions (Signed)
Gastrointestinal Bleeding Gastrointestinal (GI) bleeding means there is bleeding somewhere along the digestive tract, between the mouth and anus. CAUSES  There are many different problems that can cause GI bleeding. Possible causes include:  Esophagitis. This is inflammation, irritation, or swelling of the esophagus.  Hemorrhoids.These are veins that are full of blood (engorged) in the rectum. They cause pain, inflammation, and may bleed.  Anal fissures.These are areas of painful tearing which may bleed. They are often caused by passing hard stool.  Diverticulosis.These are pouches that form on the colon over time, with age, and may bleed significantly.  Diverticulitis.This is inflammation in areas with diverticulosis. It can cause pain, fever, and bloody stools, although bleeding is rare.  Polyps and cancer. Colon cancer often starts out as precancerous polyps.  Gastritis and ulcers.Bleeding from the upper gastrointestinal tract (near the stomach) may travel through the intestines and produce black, sometimes tarry, often bad smelling stools. In certain cases, if the bleeding is fast enough, the stools may not be black, but red. This condition may be life-threatening. SYMPTOMS   Vomiting bright red blood or material that looks like coffee grounds.  Bloody, black, or tarry stools. DIAGNOSIS  Your caregiver may diagnose your condition by taking your history and performing a physical exam. More tests may be needed, including:  X-rays and other imaging tests.  Esophagogastroduodenoscopy (EGD). This test uses a flexible, lighted tube to look at your esophagus, stomach, and small intestine.  Colonoscopy. This test uses a flexible, lighted tube to look at your colon. TREATMENT  Treatment depends on the cause of your bleeding.   For bleeding from the esophagus, stomach, small intestine, or colon, the caregiver doing your EGD or colonoscopy may be able to stop the bleeding as part of  the procedure.  Inflammation or infection of the colon can be treated with medicines.  Many rectal problems can be treated with creams, suppositories, or warm baths.  Surgery is sometimes needed.  Blood transfusions are sometimes needed if you have lost a lot of blood. If bleeding is slow, you may be allowed to go home. If there is a lot of bleeding, you will need to stay in the hospital for observation. HOME CARE INSTRUCTIONS   Take any medicines exactly as prescribed.  Keep your stools soft by eating foods that are high in fiber. These foods include whole grains, legumes, fruits, and vegetables. Prunes (1 to 3 a day) work well for many people.  Drink enough fluids to keep your urine clear or pale yellow. SEEK IMMEDIATE MEDICAL CARE IF:   Your bleeding increases.  You feel lightheaded, weak, or you faint.  You have severe cramps in your back or abdomen.  You pass large blood clots in your stool.  Your problems are getting worse. MAKE SURE YOU:   Understand these instructions.  Will watch your condition.  Will get help right away if you are not doing well or get worse. Document Released: 06/05/2000 Document Revised: 05/25/2012 Document Reviewed: 05/18/2011 [facility] Patient Information 2015 [place], [place]. This information is not intended to replace advice given to you by your health care provider. Make sure you discuss any questions you have with your health care provider. Thrombocytopenia Thrombocytopenia is a condition in which there is an abnormally small number of platelets in your blood. Platelets are also called thrombocytes. Platelets are needed for blood clotting. CAUSES Thrombocytopenia is caused by:   Decreased production of platelets. This can be caused by:  Aplastic anemia in which your bone  marrow quits making blood cells.  Cancer in the bone marrow.  Use of certain medicines, including chemotherapy.  Infection in the bone marrow.  Heavy alcohol  consumption.  Increased destruction of platelets. This can be caused by:  Certain immune diseases.  Use of certain drugs.  Certain blood clotting disorders.  Certain inherited disorders.  Certain bleeding disorders.  Pregnancy.  Having an enlarged spleen (hypersplenism). In hypersplenism, the spleen gathers up platelets from circulation. This means the platelets are not available to help with blood clotting. The spleen can enlarge due to cirrhosis or other conditions. SYMPTOMS  The symptoms of thrombocytopenia are side effects of poor blood clotting. Some of these are:  Abnormal bleeding.  Nosebleeds.  Heavy menstrual periods.  Blood in the urine or stools.  Purpura. This is a purplish discoloration in the skin produced by small bleeding vessels near the surface of the skin.  Bruising.  A rash that may be petechial. This looks like pinpoint, purplish-red spots on the skin and mucous membranes. It is caused by bleeding from small blood vessels (capillaries). DIAGNOSIS  Your caregiver will make this diagnosis based on your exam and blood tests. Sometimes, a bone marrow study is done to look for the original cells (megakaryocytes) that make platelets. TREATMENT  Treatment depends on the cause of the condition.  Medicines may be given to help protect your platelets from being destroyed.  In some cases, a replacement (transfusion) of platelets may be required to stop or prevent bleeding.  Sometimes, the spleen must be surgically removed. HOME CARE INSTRUCTIONS   Check the skin and linings inside your mouth for bruising or bleeding as directed by your caregiver.  Check your sputum, urine, and stool for blood as directed by your caregiver.  Do not return to any activities that could cause bumps or bruises until your caregiver says it is okay.  Take extra care not to cut yourself when shaving or when using scissors, needles, knives, and other tools.  Take extra care not  to burn yourself when ironing or cooking.  Ask your caregiver if it is okay for you to drink alcohol.  Only take over-the-counter or prescription medicines as directed by your caregiver.  Notify all your caregivers, including dentists and eye doctors, about your condition. SEEK IMMEDIATE MEDICAL CARE IF:   You develop active bleeding from anywhere in your body.  You develop unexplained bruising or bleeding.  You have blood in your sputum, urine, or stool. MAKE SURE YOU:  Understand these instructions.  Will watch your condition.  Will get help right away if you are not doing well or get worse. Document Released: 06/08/2005 Document Revised: 08/31/2011 Document Reviewed: 04/10/2011 [facility] Patient Information 2015 [place], [place]. This information is not intended to replace advice given to you by your health care provider. Make sure you discuss any questions you have with your health care provider.

## 2014-05-22 NOTE — Progress Notes (Signed)
[place] to home, family at bedside, d/c instructions and follow up appointment done , verbalized understanding and was given to the patient. PICC line in place upon discharge, heplock by IV RN.

## 2014-05-22 NOTE — Discharge Summary (Signed)
Physician Discharge Summary  [name] YCX:[id number] DOB: 04-12-1956 DOA: 05/19/2014  PCP: Simona Huh, MD  Admit date: 05/19/2014 Discharge date: 05/22/2014  Recommendations for Outpatient Follow-up:  1. Recommend to stop aspirin for 7 days on discharge and continue to monitor for signs and symptoms of bleeding such as bright red blood per rectum, black stool or maroon color stool. Also please monitor for any bleeding such as nose bleeding, bleeding form mucosal surfaces, gums, easy bruising which may indicate lower platelet count. 2. Please take Protonix for about 2 weeks on discharge. 3. Please take ciprofloxacin twice daily as prescribed for 5 days on discharge. You came in with possible infection. Blood cultures so far are negative and urine culture is negative. Ciprofloxacin is empiric treatment.  4. Please take K-Phos supplement for 5 days on discharge. This will help replete potassium and phosphorus which were low on this admission.  Discharge Diagnoses:  Active Problems:   Cancer of base of tongue   Diabetes mellitus type 2, controlled, without complications   Neutropenic fever   Hypertension   Sepsis    Discharge Condition: stable   Diet recommendation: as tolerated   History of present illness:  58 y.o. 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 Patient also had associated diarrhea that has lasted over past few days prior to this admission.  In ED, patient was found to be febrile with heart rate 123, respiratory rate 20, blood pressure 104/46, oxygen saturation 96% on room air. Blood work revealed hemoglobin of 9.4, neutrophils low at 1.3, platelets 93, potassium 3.3 and blood glucose 206. Chest x-ray on admission showed no evidence  of acute cardiopulmonary disease. Patient was started on broad-spectrum antibiotics for possible febrile neutropenia and sepsis.  Assessment/Plan:    Principal problem: Febrile neutropenia - Likely secondary to sequela of recent chemotherapy. Patient has received Neulasta support 05/16/2014 - Neutrophil count on admission low at 1.3 normal white blood cell count - pt on  Azactam, Levaquin, vancomycin. Will change to ciprofloxacin for 5 more days on discharge. - Blood cultures and urine cultures so far negative. C. difficile negative.  Active problems: Sepsis - Sepsis criteria met on the admission with initial vitals that included tachycardia, hypotension, fever. In addition patient has procalcitonin level of 0.25. Unclear source of infection. Blood cultures pending but negative to date. C. difficile negative Urinalysis on the admission did not show evidence of infection. - We will send patient home with ciprofloxacin for 5 days on discharge.  Hypokalemia with Hypophosphatemia - Supplemented with K-Phos in hospital. Prescription provided for 5 more days off K-Phos on discharge  Hematemesis - Appreciate the GI consult and recommendations. So far no GI interventions planned.  - Patient is on Protonix drip as well as Protonix 40 mg IV every 12 hours.  - On discharge we will provide prescription for Protonix 40 mg daily for about 2 weeks.  - no further hematemesis. No black stool or maroon color stool.  Acute blood loss anemia imposed on anemia of chronic disease, cancer, DM - Hg drop since admission - 1 unit of PRBC transfusion given 05/21/2014. Hemoglobin is 7.7. CBC will be monitored in cancer center tomorrow per scheduled appointment.  Cancer of base of tongue - Management per oncology. Last chemotherapy 05/11/2014 with Neulasta support  - appreciate Dr. Alvy Bimler  help Diabetes mellitus type 2, controlled, without complications - Patient tolerates by mouth intake, resumed metformin   Essential hypertension - Due to soft blood pressure antihypertensives are on hold.   - blood pressure this morning is 118/59. Patient can continue taking her home medications and discharge. Dyslipidemia - Continue Lipitor daily Moderate malnutrition in the setting of chronic illness  - tolerating regular diet   DVT Prophylaxis  - stopped heparin subcutaneous and start SCDs bilaterally due to risk of bleeding   Code Status: Full.  Family Communication: plan of care discussed with the patient   IV access:   Peripheral IV  Procedures and diagnostic studies:   Dg Chest Port 1 View 05/19/2014 No evidence of acute cardiopulmonary disease.   Medical Consultants:   GI (Dr. Carol Ada)  Oncology Dr Heath Lark  Other Consultants:   None  IAnti-Infectives:    Azactam 05/19/2014 --> 05/22/2014  Levaquin 05/19/2014 --> 05/22/2014   Vancomycin 05/19/2014 --> 05/22/2014    Signed:  Leisa Lenz, MD  [facility] 05/22/2014, 11:02 AM  Pager #: [phone]   Discharge Exam: Filed Vitals:   05/22/14 0633  BP: 118/59  Pulse: 87  Temp: 98.1 F (36.7 C)  Resp: 20   Filed Vitals:   05/21/14 2003 05/21/14 2039 05/21/14 2246 05/22/14 0633  BP: 139/58 125/65 124/50 118/59  Pulse: 112 102 92 87  Temp: 98.2 F (36.8 C) 98.2 F (36.8 C) 98.7 F (37.1 C) 98.1 F (36.7 C)  TempSrc: Oral Oral Oral Oral  Resp: _0 Height:      Weight:      SpO2: 100% 100% 98% 97%    General: Pt is alert, follows commands appropriately, not in acute distress Cardiovascular: Regular rate and rhythm, S1/S2 +, no murmurs Respiratory: Clear to auscultation bilaterally, no wheezing, no crackles, no rhonchi Abdominal: Soft, non tender, non distended, bowel sounds +, no guarding Extremities: no edema, no cyanosis, pulses palpable bilaterally DP and PT Neuro: Grossly nonfocal  Discharge Instructions  Discharge Instructions    Call MD for:   difficulty breathing, headache or visual disturbances    Complete by:  As directed      Call MD for:  hives    Complete by:  As directed      Call MD for:  persistant dizziness or light-headedness    Complete by:  As directed      Call MD for:  persistant nausea and vomiting    Complete by:  As directed      Call MD for:  redness, tenderness, or signs of infection (pain, swelling, redness, odor or green/yellow discharge around incision site)    Complete by:  As directed      Call MD for:  severe uncontrolled pain    Complete by:  As directed      Diet - low sodium heart healthy    Complete by:  As directed      Discharge instructions    Complete by:  As directed   1. Recommend to stop aspirin for 7 days on discharge and continue to monitor for signs and symptoms of bleeding such as bright red blood per rectum, black stool or maroon color stool. Also please monitor for any bleeding such as nose bleeding, bleeding form mucosal surfaces, gums, easy bruising which may indicate lower platelet count. 2. Please take Protonix for about 2 weeks on discharge. 3. Please take ciprofloxacin twice daily as prescribed for 5 days on discharge. You  came in with possible infection. Blood cultures so far are negative and urine culture is negative. Ciprofloxacin is empiric treatment.  4. Please take K-Phos supplement for 5 days on discharge. This will help replete potassium and phosphorus which were low on this admission.     Increase activity slowly    Complete by:  As directed             Medication List    STOP taking these medications        aspirin 81 MG tablet      TAKE these medications        ALPRAZolam 0.25 MG tablet  Commonly known as:  XANAX  Take 0.25 mg by mouth as needed for anxiety.     amLODipine 10 MG tablet  Commonly known as:  NORVASC  Take 10 mg by mouth daily.     atorvastatin 10 MG tablet  Commonly known as:  LIPITOR  Take 10 mg by mouth daily.     ciprofloxacin 500  MG tablet  Commonly known as:  CIPRO  Take 1 tablet (500 mg total) by mouth 2 (two) times daily.     dexamethasone 4 MG tablet  Commonly known as:  DECADRON  Start the day before and the day after chemotherapy, 2 times a day every 3 weeks     lidocaine-prilocaine cream  Commonly known as:  EMLA  Apply to affected area once     loperamide 2 MG capsule  Commonly known as:  IMODIUM  Take 2 mg by mouth as needed for diarrhea or loose stools.     losartan-hydrochlorothiazide 100-12.5 MG per tablet  Commonly known as:  HYZAAR  Take 1 tablet by mouth daily.     metFORMIN 500 MG tablet  Commonly known as:  GLUCOPHAGE  Take 500 mg by mouth daily with breakfast.     multivitamin-prenatal 27-0.8 MG Tabs tablet  Take 1 tablet by mouth daily at 12 noon.     ondansetron 8 MG tablet  Commonly known as:  ZOFRAN  Take 1 tablet (8 mg total) by mouth every 8 (eight) hours as needed (Nausea or vomiting).     pantoprazole 40 MG tablet  Commonly known as:  PROTONIX  Take 1 tablet (40 mg total) by mouth daily.     phosphorus 155-852-130 MG tablet  Commonly known as:  K PHOS NEUTRAL  Take 1 tablet (250 mg total) by mouth 2 (two) times daily.     PRESCRIPTION MEDICATION  CHEMO [facility]     prochlorperazine 10 MG tablet  Commonly known as:  COMPAZINE  Take 1 tablet (10 mg total) by mouth every 6 (six) hours as needed (Nausea or vomiting).     Sodium Chloride Flush 0.9 % Soln injection  Inject 10 mLs into the vein daily.     sodium fluoride 1.1 % Gel dental gel  Commonly known as:  FLUORISHIELD  Instill one drop of gel per tooth space of fluoride tray. Place over teeth for 5 minutes. Remove. Spit out excess. Repeat nightly.           Follow-up Information    Follow up with Simona Huh, MD. Schedule an appointment as soon as possible for a visit in 2 weeks.   Specialty:  Family Medicine   Why:  As needed, Follow up appt after recent hospitalization   Contact information:   [address], [place] [address] [phone]       Follow up with  GORSUCH, NI, MD On 05/23/2014.   Specialty:  Hematology and Oncology   Contact information:   [place] [id number] [phone]        The results of significant diagnostics from this hospitalization (including imaging, microbiology, ancillary and laboratory) are listed below for reference.    Significant Diagnostic Studies: US Soft Tissue Head/neck  05/03/2014   CLINICAL DATA:  Right thyroid nodule by CT.  EXAM: THYROID ULTRASOUND  TECHNIQUE: Ultrasound examination of the thyroid gland and adjacent soft tissues was performed.  COMPARISON:  04/20/2014  FINDINGS: Right thyroid lobe  Measurements: 5.7 x 2.4 x 3.1 cm. Diffusely heterogeneous mildly enlarged right thyroid lobe. Normal vascularity. Small solid well-circumscribed right lower pole nodule only measures 13 x 10 x 12 mm. No other significant focal right thyroid abnormality.  Left thyroid lobe  Measurements: 5.0 x 1.7 x 1.8 cm. Heterogeneous thyroid echotexture without a discrete nodule or mass.  Isthmus  Thickness: 7.5 mm.  No nodules visualized.  Lymphadenopathy  Abnormal right cervical adenopathy which correlates with the neck CT, concerning for metastatic right cervical nodes related to the posterior base of tongue mass.  IMPRESSION: Heterogeneous asymmetric enlargement of the right thyroid lobe diffusely.  13 mm right lower pole incidental thyroid nodule.  Right cervical adenopathy, as above. This has already been biopsied by ENT.  Findings do not meet current SRU consensus criteria for biopsy. Follow-up by clinical exam is recommended. If patient has known risk factors for thyroid carcinoma, consider follow-up ultrasound in 12 months. If patient is clinically hyperthyroid, consider nuclear medicine thyroid uptake and scan.Reference: Management of Thyroid Nodules Detected at [place]: Society of Radiologists in [place]. Radiology 2005; [id number].   Electronically Signed   By: Daryll Brod M.D.   On: 05/03/2014 16:27   Nm Pet Image Initial (pi) Skull Base To Thigh  05/09/2014   CLINICAL DATA:  Initial treatment strategy for head neck cancer.  EXAM: NUCLEAR MEDICINE PET SKULL BASE TO THIGH  TECHNIQUE: 9.6 mCi F-18 FDG was injected intravenously. Full-ring PET imaging was performed from the skull base to thigh after the radiotracer. CT data was obtained and used for attenuation correction and anatomic localization.  FASTING BLOOD GLUCOSE:  Value: 147 mg/dl  COMPARISON:  CT neck 04/20/2014  FINDINGS: NECK  There is intense FDG uptake associated with the large tongue base mass. The mass measures approximately 3.9 x 3.8 cm and has an SUV max equal to 13.5. Hypermetabolic right level 2 cervical node measures 1.4 x 0.9 cm and has an SUV max equal to 2.3. Right-sided level 3 lymph node measures 2 x 2.3 cm and has an SUV max equal to 7.4. No hypermetabolic left cervical lymph nodes. Nodule within the right lobe of thyroid gland extending into the isthmus measures 2.8 cm and has an SUV max equal to 4.1.  CHEST  No hypermetabolic mediastinal or hilar nodes. No suspicious pulmonary nodules on the CT scan.  ABDOMEN/PELVIS  No abnormal hypermetabolic activity within the liver, pancreas, adrenal glands, or spleen. Gallstone identified. No hypermetabolic lymph nodes in the abdomen or pelvis.  SKELETON  No focal hypermetabolic activity to suggest skeletal metastasis.  IMPRESSION: 1. There is intense FDG uptake associated with the large tongue base mass concerning for primary malignancy. 2. Malignant range FDG uptake is associated with the metastatic right level 2 and 3 lymph nodes. 3. No evidence for metastatic disease to the chest abdomen or pelvis.   Electronically Signed   By:  Kerby Moors M.D.   On: 05/09/2014 10:35   Ir Fluoro Guide Cv Line Right  05/10/2014   INDICATION: Cancer of base of tongue; central venous access  is requested for chemotherapy .  EXAM: ULTRASOUND AND FLUOROSCOPIC GUIDED PICC LINE INSERTION  MEDICATIONS: None.  CONTRAST:  None  FLUOROSCOPY TIME:  30 seconds.  COMPLICATIONS: None immediate  TECHNIQUE: The procedure, risks, benefits, and alternatives were explained to the patient and informed written consent was obtained. A timeout was performed prior to the initiation of the procedure.  The right upper extremity was prepped with chlorhexidine in a sterile fashion, and a sterile drape was applied covering the operative field. Maximum barrier sterile technique with sterile gowns and gloves were used for the procedure. A timeout was performed prior to the initiation of the procedure. Local anesthesia was provided with 1% lidocaine.  Under direct ultrasound guidance, the right basilic vein was accessed with a micropuncture kit after the overlying soft tissues were anesthetized with 1% lidocaine. An ultrasound image was saved for documentation purposes. A guidewire was advanced to the level of the superior caval-atrial junction for measurement purposes and the PICC line was cut to length. A peel-away sheath was placed and a 40 cm, 5 [place], dual lumen was inserted to level of the superior caval-atrial junction. A post procedure spot fluoroscopic was obtained. The catheter easily aspirated and flushed and was sutured in place. A dressing was placed. The patient tolerated the procedure well without immediate post procedural complication.  FINDINGS: After catheter placement, the tip lies within the superior cavoatrial junction. The catheter aspirates and flushes normally and is ready for immediate use.  IMPRESSION: Successful ultrasound and fluoroscopic guided placement of a right basilic vein approach, 40 cm, 5 French, dual lumen PICC with tip at the superior caval-atrial junction. The PICC line is ready for immediate use.  Read by: Rowe Robert, PA-C   Electronically Signed   By: Markus Daft M.D.   On: 05/10/2014  10:08   Ir US Guide Vasc Access Right  05/15/2014   INDICATION: Cancer of base of tongue; central venous access is requested for chemotherapy .  EXAM: ULTRASOUND AND FLUOROSCOPIC GUIDED PICC LINE INSERTION  MEDICATIONS: None.  CONTRAST:  None  FLUOROSCOPY TIME:  30 seconds.  COMPLICATIONS: None immediate  TECHNIQUE: The procedure, risks, benefits, and alternatives were explained to the patient and informed written consent was obtained. A timeout was performed prior to the initiation of the procedure.  The right upper extremity was prepped with chlorhexidine in a sterile fashion, and a sterile drape was applied covering the operative field. Maximum barrier sterile technique with sterile gowns and gloves were used for the procedure. A timeout was performed prior to the initiation of the procedure. Local anesthesia was provided with 1% lidocaine.  Under direct ultrasound guidance, the right basilic vein was accessed with a micropuncture kit after the overlying soft tissues were anesthetized with 1% lidocaine. An ultrasound image was saved for documentation purposes. A guidewire was advanced to the level of the superior caval-atrial junction for measurement purposes and the PICC line was cut to length. A peel-away sheath was placed and a 40 cm, 5 [place], dual lumen was inserted to level of the superior caval-atrial junction. A post procedure spot fluoroscopic was obtained. The catheter easily aspirated and flushed and was sutured in place. A dressing was placed. The patient tolerated the procedure well without immediate post procedural complication.  FINDINGS: After catheter placement, the tip lies within the  superior cavoatrial junction. The catheter aspirates and flushes normally and is ready for immediate use.  IMPRESSION: Successful ultrasound and fluoroscopic guided placement of a right basilic vein approach, 40 cm, 5 French, dual lumen PICC with tip at the superior caval-atrial junction. The PICC line is ready  for immediate use.  Read by: Rowe Robert, PA-C   Electronically Signed   By: Markus Daft M.D.   On: 05/10/2014 10:08   Dg Chest Port 1 View  05/19/2014   CLINICAL DATA:  Fever, weakness, hemoptysis.  Tongue cancer.  EXAM: PORTABLE CHEST - 1 VIEW  COMPARISON:  PET-CT dated 05/09/2014  FINDINGS: Lungs are essentially clear.  No pleural effusion or pneumothorax.  The heart is normal in size.  IMPRESSION: No evidence of acute cardiopulmonary disease.   Electronically Signed   By: Julian Hy M.D.   On: 05/19/2014 17:30    Microbiology: Recent Results (from the past 240 hour(s))  Blood Culture (routine x 2)     Status: None (Preliminary result)   Collection Time: 05/19/14  4:27 PM  Result Value Ref Range Status   Specimen Description BLOOD LEFT WRIST  Final   Special Requests BOTTLES DRAWN AEROBIC AND ANAEROBIC [facility] EACH  Final   Culture  Setup Time   Final    05/19/2014 21:18 Performed at [facility]    Culture   Final           BLOOD CULTURE RECEIVED NO GROWTH TO DATE CULTURE WILL BE HELD FOR 5 DAYS BEFORE ISSUING A FINAL NEGATIVE REPORT Performed at [facility]    Report Status PENDING  Incomplete  Blood Culture (routine x 2)     Status: None (Preliminary result)   Collection Time: 05/19/14  4:27 PM  Result Value Ref Range Status   Specimen Description BLOOD RIGHT ANTECUBITAL  Final   Special Requests BOTTLES DRAWN AEROBIC AND ANAEROBIC 5CC EACH  Final   Culture  Setup Time   Final    05/19/2014 21:18 Performed at [facility]    Culture   Final           BLOOD CULTURE RECEIVED NO GROWTH TO DATE CULTURE WILL BE HELD FOR 5 DAYS BEFORE ISSUING A FINAL NEGATIVE REPORT Performed at [facility]    Report Status PENDING  Incomplete  Urine culture     Status: None   Collection Time: 05/19/14  5:52 PM  Result Value Ref Range Status   Specimen Description URINE, CLEAN CATCH  Final   Special Requests NONE  Final   Culture  Setup Time   Final     05/20/2014 02:00 Performed at [place] Performed at [facility]   Final   Culture NO GROWTH Performed at [facility]   Final   Report Status 05/21/2014 FINAL  Final  MRSA PCR Screening     Status: None   Collection Time: 05/19/14  9:28 PM  Result Value Ref Range Status   MRSA by PCR NEGATIVE NEGATIVE Final    Comment:        The GeneXpert MRSA Assay (FDA approved for NASAL specimens only), is one component of a comprehensive MRSA colonization surveillance program. It is not intended to diagnose MRSA infection nor to guide or monitor treatment for MRSA infections.   Clostridium Difficile by PCR     Status: None   Collection Time: 05/20/14  5:50 AM  Result Value Ref Range Status  C difficile by pcr NEGATIVE NEGATIVE Final    Comment: Performed at [facility]  Stool culture     Status: None (Preliminary result)   Collection Time: 05/20/14  2:27 PM  Result Value Ref Range Status   Specimen Description STOOL  Final   Special Requests Immunocompromised  Final   Culture   Final    NO SUSPICIOUS COLONIES, CONTINUING TO HOLD Note: REDUCED NORMAL FLORA PRESENT Performed at [facility]    Report Status PENDING  Incomplete     Labs: Basic Metabolic Panel:  Recent Labs Lab 05/18/14 1413 05/19/14 1538 05/20/14 0600 05/21/14 0500 05/22/14 0445  NA 135* 134* 135*  --  138  K 3.9 3.3* 3.1*  --  3.0*  CL 95* 93* 98  --  101  CO2 _0 --  26  GLUCOSE 177* 206* 169*  --  144*  BUN _1 --  5*  CREATININE 0.60 0.78 0.63  --  0.66  CALCIUM 9.2 9.3 8.4  --  8.1*  MG  --   --  1.7 1.7 1.6  PHOS  --   --  1.5* 1.4* 2.2*   Liver Function Tests:  Recent Labs Lab 05/18/14 1413 05/19/14 1538 05/20/14 0600  AST _2 ALT 37* 25 17  ALKPHOS 85 76 67  BILITOT 0.5 0.5 0.3  PROT 7.4 7.3 5.9*  ALBUMIN 3.4* 3.1* 2.5*    Recent Labs Lab 05/19/14 1538  LIPASE 10*   No results for  input(s): AMMONIA in the last 168 hours. CBC:  Recent Labs Lab 05/18/14 1413 05/19/14 1538  05/20/14 1230 05/20/14 1837 05/21/14 0030 05/21/14 0500 05/22/14 0445  WBC 1.1* 3.1*  < > 10.0 12.5* 14.3* 15.2* 14.2*  NEUTROABS 0.1* 1.3*  --   --   --   --   --  8.7*  HGB 11.7* 11.1*  < > 9.3* 8.0* 7.5* 7.4* 7.7*  HCT 34.0* 32.2*  < > 26.4* 23.0* 21.5* 21.4* 22.6*  MCV 88.8 89.4  < > 88.3 89.8 88.8 90.3 87.6  PLT 126* 104*  < > 118* 105* 106* 104* 111*  < > = values in this interval not displayed. Cardiac Enzymes:  Recent Labs Lab 05/20/14 0600  TROPONINI <0.30   BNP: BNP (last 3 results) No results for input(s): PROBNP in the last 8760 hours. CBG: No results for input(s): GLUCAP in the last 168 hours.  Time coordinating discharge: Over 30 minutes

## 2014-05-22 NOTE — Telephone Encounter (Signed)
added appt per staff....pt ok adn aware

## 2014-05-22 NOTE — Plan of Care (Signed)
Problem: Phase III Progression Outcomes Goal: Pain controlled on oral analgesia Outcome: Completed/Met Date Met:  05/22/14 Goal: Foley discontinued Outcome: Not Applicable Date Met:  21/74/71 Goal: Discharge plan remains appropriate-arrangements made Outcome: Completed/Met Date Met:  05/22/14 Goal: Other Phase III Outcomes/Goals Outcome: Completed/Met Date Met:  05/22/14

## 2014-05-22 NOTE — Plan of Care (Signed)
Problem: Discharge Progression Outcomes Goal: Hemodynamically stable Outcome: Completed/Met Date Met:  82/41/75 Goal: Complications resolved/controlled Outcome: Completed/Met Date Met:  05/22/14 Goal: Tolerating diet Outcome: Completed/Met Date Met:  05/22/14

## 2014-05-22 NOTE — Plan of Care (Signed)
Problem: Discharge Progression Outcomes Goal: Discharge plan in place and appropriate Outcome: Completed/Met Date Met:  05/22/14 Goal: Pain controlled with appropriate interventions Outcome: Completed/Met Date Met:  05/22/14

## 2014-05-22 NOTE — Progress Notes (Signed)
[name]   DOB:1956-02-27   JY#:[id number]   YQM#:[id number]  Patient Care Team: Gaynelle Arabian, MD as PCP - General (Family Medicine) Brooks Sailors, RN as Oncology Nurse Wallace Ridge, RD as Dietitian (Nutrition) Eppie Gibson, MD as Attending Physician (Radiation Oncology) I have seen the patient, examined her and edited the notes as follows  Subjective: Patient seen and examined. Afebrile. "Feeling a lot better" this morning. Denies shortness of breath or chest pain. Denies nausea or vomiting, no hematemesis. Denies diarrhea or constipation. Appetite improved. Last bowel movement on 11/30. Denies pain. Ambulating without difficulty.   Scheduled Meds: . sodium chloride   Intravenous Once  . atorvastatin  10 mg Oral Daily  . aztreonam  2 g Intravenous Q8H  . levofloxacin (LEVAQUIN) IV  750 mg Intravenous Q24H  . loperamide  2 mg Oral TID  . metFORMIN  500 mg Oral Q breakfast  . [START ON 05/23/2014] pantoprazole (PROTONIX) IV  40 mg Intravenous Q12H  . phosphorus  250 mg Oral BID  . potassium chloride  40 mEq Oral Once  . sodium chloride  3 mL Intravenous Q12H  . vancomycin  1,000 mg Intravenous Q12H   Continuous Infusions: . pantoprozole (PROTONIX) infusion 8 mg/hr (05/22/14 0055)   PRN Meds:acetaminophen **OR** acetaminophen, ALPRAZolam, ondansetron **OR** ondansetron (ZOFRAN) IV, sodium chloride   Objective:  Filed Vitals:   05/22/14 0633  BP: 118/59  Pulse: 87  Temp: 98.1 F (36.7 C)  Resp: 20      Intake/Output Summary (Last 24 hours) at 05/22/14 5284 Last data filed at 05/22/14 0017  Gross per 24 hour  Intake   1785 ml  Output      0 ml  Net   1785 ml    ECOG PERFORMANCE STATUS: 1  GENERAL:alert, no distress and comfortable SKIN: skin color, texture, turgor are normal, no rashes or significant lesions EYES: normal, conjunctiva are pink and non-injected, sclera clear OROPHARYNX:no exudate, no erythema and lips, buccal mucosa, and tongue  normal  NECK: supple, thyroid normal size, non-tender, without nodularity LYMPH:  no palpable lymphadenopathy in the cervical, axillary or inguinal LUNGS: clear to auscultation and percussion with normal breathing effort HEART: regular rate & rhythm and no murmurs and no lower extremity edema ABDOMEN:abdomen soft, non-tender and normal bowel sounds Musculoskeletal:no cyanosis of digits and no clubbing  PSYCH: alert & oriented x 3 with fluent speech NEURO: no focal motor/sensory deficits    CBG (last 3)  No results for input(s): GLUCAP in the last 72 hours.   Labs:   Recent Labs Lab 05/18/14 1413 05/19/14 1538  05/20/14 1230 05/20/14 1837 05/21/14 0030 05/21/14 0500 05/22/14 0445  WBC 1.1* 3.1*  < > 10.0 12.5* 14.3* 15.2* 14.2*  HGB 11.7* 11.1*  < > 9.3* 8.0* 7.5* 7.4* 7.7*  HCT 34.0* 32.2*  < > 26.4* 23.0* 21.5* 21.4* 22.6*  PLT 126* 104*  < > 118* 105* 106* 104* 111*  MCV 88.8 89.4  < > 88.3 89.8 88.8 90.3 87.6  MCH 30.5 30.8  < > 31.1 31.3 31.0 31.2 29.8  MCHC 34.4 34.5  < > 35.2 34.8 34.9 34.6 34.1  RDW 11.9 11.8  < > 12.0 12.1 12.2 12.3 13.8  LYMPHSABS 0.7 0.7  --   --   --   --   --  3.3  MONOABS 0.2 1.1*  --   --   --   --   --  2.1*  EOSABS 0.1 0.0  --   --   --   --   --  0.1  BASOSABS 0.0 0.0  --   --   --   --   --  0.0  < > = values in this interval not displayed.   Chemistries:    Recent Labs Lab 05/18/14 1413 05/19/14 1538 05/20/14 0600 05/21/14 0500 05/22/14 0445  NA 135* 134* 135*  --  138  K 3.9 3.3* 3.1*  --  3.0*  CL 95* 93* 98  --  101  CO2 26 25 25   --  26  GLUCOSE 177* 206* 169*  --  144*  BUN 19 9 9   --  5*  CREATININE 0.60 0.78 0.63  --  0.66  CALCIUM 9.2 9.3 8.4  --  8.1*  MG  --   --  1.7 1.7 1.6  AST 16 10 10   --   --   ALT 37* 25 17  --   --   ALKPHOS 85 76 67  --   --   BILITOT 0.5 0.5 0.3  --   --     GFR Estimated Creatinine Clearance: 87.9 mL/min (by C-G formula based on Cr of 0.66).  Liver Function Tests:  Recent  Labs Lab 05/18/14 1413 05/19/14 1538 05/20/14 0600  AST 16 10 10   ALT 37* 25 17  ALKPHOS 85 76 67  BILITOT 0.5 0.5 0.3  PROT 7.4 7.3 5.9*  ALBUMIN 3.4* 3.1* 2.5*    Recent Labs Lab 05/19/14 1538  LIPASE 10*    Coagulation profile  Recent Labs Lab 05/18/14 1413 05/19/14 1627  INR 1.03 1.18    Cardiac Enzymes:  Recent Labs Lab 05/20/14 0600  TROPONINI <0.30    Recent Labs  05/20/14 0600  TSH 2.850   Microbiology Cultures negative to date. C Diff negative   Imaging Studies:  No results found.  Assessment/Plan: 58 y.o.   #1 tongue cancer s/p C1 D1 on 05/11/14 She has excellent response to treatment clinically. Continue aggressive supportive care.  #2 anemia This is due to ongoing bleeding. She received 1 unit of red blood cells on 11/30 as she was symptomatic  Recommend transfusion if she has symptoms of anemia now or to transfuse empirically with hemoglobin less than 7 g.  #3 hemoptysis This is not an unexpected event due to presence of locally advanced tumor.  Suspected this is just bleeding from necrotic tumor.  Continue supportive care.  #4 hyponatremia with hypokalemia This is likely due to GI losses.  Recommend IV fluid replacement and potassium replacement  #5 mild diarrhea This was likely due to melena causing diarrhea or side effects of 5-FU treatment. C difficile was negative She was placed on Imodium around the clock with resolution of symptoms  #6 DVT prophylaxis Lovenox was placed on hold on 11/30 in the presence of recent bleeding. She is on mechanical devices  #7 thrombocytopenia This is likely due to recent chemotherapy and consumption from bleeding.  No need transfusions unless platelet count dropped to less than 20,000.  Recommend close observation  #8 mild leukocytosis Clinically, she does not appear to be infected. She had received Neulasta on 11/25  Cultures are negative to date.  Discontinue antibiotics once final  cultures are negative  #9 CODE STATUS Full code  #10 discharge planning Likely to be discharged within the next 24 hrs. She has appointment with Dr. Alvy Bimler on 12/2 for follow up.   Other medical issues as per admitting team     **Disclaimer: This note was dictated with voice recognition software. Similar sounding  words can inadvertently be transcribed and this note may contain transcription errors which may not have been corrected upon publication of note.Sharene Butters E, PA-C 05/22/2014  7:42 AM Zaliyah Meikle, MD 05/22/2014

## 2014-05-22 NOTE — Plan of Care (Signed)
Problem: Consults Goal: General Medical Patient Education See Patient Education Module for specific education.  Outcome: Completed/Met Date Met:  05/22/14 Goal: Skin Care Protocol Initiated - if Braden Score 18 or less If consults are not indicated, leave blank or document N/A  Outcome: Not Applicable Date Met:  86/75/44 Goal: Nutrition Consult-if indicated Outcome: Not Applicable Date Met:  92/01/00 Goal: Diabetes Guidelines if Diabetic/Glucose > 140 If diabetic or lab glucose is > 140 mg/dl - Initiate Diabetes/Hyperglycemia Guidelines & Document Interventions  Outcome: Not Applicable Date Met:  71/21/97  Problem: Discharge Progression Outcomes Goal: Activity appropriate for discharge plan Outcome: Completed/Met Date Met:  05/22/14 Goal: Other Discharge Outcomes/Goals Outcome: Completed/Met Date Met:  05/22/14

## 2014-05-22 NOTE — Plan of Care (Signed)
Problem: Phase II Progression Outcomes Goal: IV changed to normal saline lock Outcome: Completed/Met Date Met:  05/22/14  Problem: Phase III Progression Outcomes Goal: IV/normal saline lock discontinued Outcome: Not Applicable Date Met:  10/28/16 Discharge with PICC line

## 2014-05-22 NOTE — Plan of Care (Signed)
Problem: Phase III Progression Outcomes Goal: Activity at appropriate level-compared to baseline (UP IN CHAIR FOR HEMODIALYSIS)  Outcome: Completed/Met Date Met:  05/22/14 Goal: Voiding independently Outcome: Completed/Met Date Met:  05/22/14

## 2014-05-23 ENCOUNTER — Telehealth: Payer: Self-pay | Admitting: *Deleted

## 2014-05-23 ENCOUNTER — Ambulatory Visit (HOSPITAL_BASED_OUTPATIENT_CLINIC_OR_DEPARTMENT_OTHER): Payer: BC Managed Care – PPO | Admitting: Hematology and Oncology

## 2014-05-23 ENCOUNTER — Other Ambulatory Visit (HOSPITAL_BASED_OUTPATIENT_CLINIC_OR_DEPARTMENT_OTHER): Payer: BC Managed Care – PPO

## 2014-05-23 ENCOUNTER — Ambulatory Visit (HOSPITAL_BASED_OUTPATIENT_CLINIC_OR_DEPARTMENT_OTHER): Payer: BC Managed Care – PPO

## 2014-05-23 VITALS — BP 123/61 | HR 103 | Temp 98.2°F | Resp 18 | Ht 67.0 in | Wt 203.0 lb

## 2014-05-23 DIAGNOSIS — I959 Hypotension, unspecified: Secondary | ICD-10-CM

## 2014-05-23 DIAGNOSIS — E876 Hypokalemia: Secondary | ICD-10-CM

## 2014-05-23 DIAGNOSIS — J069 Acute upper respiratory infection, unspecified: Secondary | ICD-10-CM

## 2014-05-23 DIAGNOSIS — C01 Malignant neoplasm of base of tongue: Secondary | ICD-10-CM

## 2014-05-23 DIAGNOSIS — K921 Melena: Secondary | ICD-10-CM

## 2014-05-23 DIAGNOSIS — Z452 Encounter for adjustment and management of vascular access device: Secondary | ICD-10-CM

## 2014-05-23 DIAGNOSIS — K2961 Other gastritis with bleeding: Secondary | ICD-10-CM

## 2014-05-23 DIAGNOSIS — Z95828 Presence of other vascular implants and grafts: Secondary | ICD-10-CM

## 2014-05-23 DIAGNOSIS — I10 Essential (primary) hypertension: Secondary | ICD-10-CM

## 2014-05-23 DIAGNOSIS — R197 Diarrhea, unspecified: Secondary | ICD-10-CM | POA: Insufficient documentation

## 2014-05-23 LAB — COMPREHENSIVE METABOLIC PANEL (CC13)
ALK PHOS: 129 U/L (ref 40–150)
ALT: 44 U/L (ref 0–55)
AST: 29 U/L (ref 5–34)
Albumin: 2.8 g/dL — ABNORMAL LOW (ref 3.5–5.0)
Anion Gap: 9 mEq/L (ref 3–11)
BUN: 6.9 mg/dL — AB (ref 7.0–26.0)
CO2: 26 mEq/L (ref 22–29)
CREATININE: 0.7 mg/dL (ref 0.6–1.1)
Calcium: 8.5 mg/dL (ref 8.4–10.4)
Chloride: 105 mEq/L (ref 98–109)
GLUCOSE: 136 mg/dL (ref 70–140)
Potassium: 3.1 mEq/L — ABNORMAL LOW (ref 3.5–5.1)
Sodium: 140 mEq/L (ref 136–145)
Total Bilirubin: 0.24 mg/dL (ref 0.20–1.20)
Total Protein: 5.9 g/dL — ABNORMAL LOW (ref 6.4–8.3)

## 2014-05-23 LAB — STOOL CULTURE

## 2014-05-23 LAB — CBC WITH DIFFERENTIAL/PLATELET
BASO%: 0.6 % (ref 0.0–2.0)
BASOS ABS: 0.1 10*3/uL (ref 0.0–0.1)
EOS%: 0.6 % (ref 0.0–7.0)
Eosinophils Absolute: 0.1 10*3/uL (ref 0.0–0.5)
HEMATOCRIT: 25.8 % — AB (ref 34.8–46.6)
HGB: 8.8 g/dL — ABNORMAL LOW (ref 11.6–15.9)
LYMPH%: 18.2 % (ref 14.0–49.7)
MCH: 30.2 pg (ref 25.1–34.0)
MCHC: 34.1 g/dL (ref 31.5–36.0)
MCV: 88.7 fL (ref 79.5–101.0)
MONO#: 0.6 10*3/uL (ref 0.1–0.9)
MONO%: 4.2 % (ref 0.0–14.0)
NEUT#: 10.7 10*3/uL — ABNORMAL HIGH (ref 1.5–6.5)
NEUT%: 76.4 % (ref 38.4–76.8)
Platelets: 153 10*3/uL (ref 145–400)
RBC: 2.91 10*6/uL — ABNORMAL LOW (ref 3.70–5.45)
RDW: 14.2 % (ref 11.2–14.5)
WBC: 14 10*3/uL — ABNORMAL HIGH (ref 3.9–10.3)
lymph#: 2.6 10*3/uL (ref 0.9–3.3)

## 2014-05-23 LAB — HOLD TUBE, BLOOD BANK

## 2014-05-23 LAB — MAGNESIUM (CC13): MAGNESIUM: 1.9 mg/dL (ref 1.5–2.5)

## 2014-05-23 MED ORDER — SODIUM CHLORIDE 0.9 % IJ SOLN
10.0000 mL | INTRAMUSCULAR | Status: DC | PRN
Start: 2014-05-23 — End: 2014-05-23
  Administered 2014-05-23: 10 mL via INTRAVENOUS
  Filled 2014-05-23: qty 10

## 2014-05-23 MED ORDER — HEPARIN SOD (PORK) LOCK FLUSH 100 UNIT/ML IV SOLN
500.0000 [IU] | Freq: Once | INTRAVENOUS | Status: AC
  Administered 2014-05-23: 250 [IU] via INTRAVENOUS
  Filled 2014-05-23: qty 5

## 2014-05-23 NOTE — Assessment & Plan Note (Signed)
Her blood pressure is a little low. Recommend she discontinue her blood pressure medicine with diuretics but to continue on Norvasc.

## 2014-05-23 NOTE — Assessment & Plan Note (Signed)
I shall continue aggressive supportive care. She is recovering from recent neutropenic fever and oral bleeding. I will see her again next week prior to cycle 2 of therapy.

## 2014-05-23 NOTE — Telephone Encounter (Signed)
Instructed pt on lab results and to continue taking the K Phos until all gone.  She verbalized understanding.

## 2014-05-23 NOTE — Assessment & Plan Note (Signed)
This is likely related to recent melena and side effects of chemotherapy. I plan to reduce 5-FU infusion by 20% in the future cycles.

## 2014-05-23 NOTE — Patient Instructions (Signed)
PICC Home Guide A peripherally inserted central catheter (PICC) is a long, thin, flexible tube that is inserted into a vein in the upper arm. It is a form of intravenous (IV) access. It is considered to be a "central" line because the tip of the PICC ends in a large vein in your chest. This large vein is called the superior vena cava (SVC). The PICC tip ends in the SVC because there is a lot of blood flow in the SVC. This allows medicines and IV fluids to be quickly distributed throughout the body. The PICC is inserted using a sterile technique by a specially trained nurse or physician. After the PICC is inserted, a chest X-ray exam is done to be sure it is in the correct place.  A PICC may be placed for different reasons, such as:  To give medicines and liquid nutrition that can only be given through a central line. Examples are:  Certain antibiotic treatments.  Chemotherapy.  Total parenteral nutrition (TPN).  To take frequent blood samples.  To give IV fluids and blood products.  If there is difficulty placing a peripheral intravenous (PIV) catheter. If taken care of properly, a PICC can remain in place for several months. A PICC can also allow a person to go home from the hospital early. Medicine and PICC care can be managed at home by a family member or home health care team. WHAT PROBLEMS CAN HAPPEN WHEN I HAVE A PICC? Problems with a PICC can occasionally occur. These may include the following:  A blood clot (thrombus) forming in or at the tip of the PICC. This can cause the PICC to become clogged. A clot-dissolving medicine called tissue plasminogen activator (tPA) can be given through the PICC to help break up the clot.  Inflammation of the vein (phlebitis) in which the PICC is placed. Signs of inflammation may include redness, pain at the insertion site, red streaks, or being able to feel a "cord" in the vein where the PICC is located.  Infection in the PICC or at the insertion  site. Signs of infection may include fever, chills, redness, swelling, or pus drainage from the PICC insertion site.  PICC movement (malposition). The PICC tip may move from its original position due to excessive physical activity, forceful coughing, sneezing, or vomiting.  A break or cut in the PICC. It is important to not use scissors near the PICC.  Nerve or tendon irritation or injury during PICC insertion. WHAT SHOULD I KEEP IN MIND ABOUT ACTIVITIES WHEN I HAVE A PICC?  You may bend your arm and move it freely. If your PICC is near or at the bend of your elbow, avoid activity with repeated motion at the elbow.  Rest at home for the remainder of the day following PICC line insertion.  Avoid lifting heavy objects as instructed by your health care provider.  Avoid using a crutch with the arm on the same side as your PICC. You may need to use a walker. WHAT SHOULD I KNOW ABOUT MY PICC DRESSING?  Keep your PICC bandage (dressing) clean and dry to prevent infection.  Ask your health care provider when you may shower. Ask your health care provider to teach you how to wrap the PICC when you do take a shower.  Change the PICC dressing as instructed by your health care provider.  Change your PICC dressing if it becomes loose or wet. WHAT SHOULD I KNOW ABOUT PICC CARE?  Check the PICC insertion site   daily for leakage, redness, swelling, or pain.  Do not take a bath, swim, or use hot tubs when you have a PICC. Cover PICC line with clear plastic wrap and tape to keep it dry while showering.  Flush the PICC as directed by your health care provider. Let your health care provider know right away if the PICC is difficult to flush or does not flush. Do not use force to flush the PICC.  Do not use a syringe that is less than 10 mL to flush the PICC.  Never pull or tug on the PICC.  Avoid blood pressure checks on the arm with the PICC.  Keep your PICC identification card with you at all  times.  Do not take the PICC out yourself. Only a trained clinical professional should remove the PICC. SEEK IMMEDIATE MEDICAL CARE IF:  Your PICC is accidentally pulled all the way out. If this happens, cover the insertion site with a bandage or gauze dressing. Do not throw the PICC away. Your health care provider will need to inspect it.  Your PICC was tugged or pulled and has partially come out. Do not  push the PICC back in.  There is any type of drainage, redness, or swelling where the PICC enters the skin.  You cannot flush the PICC, it is difficult to flush, or the PICC leaks around the insertion site when it is flushed.  You hear a "flushing" sound when the PICC is flushed.  You have pain, discomfort, or numbness in your arm, shoulder, or jaw on the same side as the PICC.  You feel your heart "racing" or skipping beats.  You notice a hole or tear in the PICC.  You develop chills or a fever. MAKE SURE YOU:   Understand these instructions.  Will watch your condition.  Will get help right away if you are not doing well or get worse. Document Released: 12/13/2002 Document Revised: 10/23/2013 Document Reviewed: 02/13/2013 Exit[employer] Patient Information 2015 [employer]. This information is not intended to replace advice given to you by your health care provider. Make sure you discuss any questions you have with your health care provider.  

## 2014-05-23 NOTE — Assessment & Plan Note (Signed)
This has resolved. Her hemoglobin is higher. I reassured to patient.

## 2014-05-23 NOTE — Assessment & Plan Note (Signed)
We get her dressing changed today. She will continue PICC line flushes at home.

## 2014-05-23 NOTE — Assessment & Plan Note (Signed)
She will continue taking Imodium as needed to control diarrhea. I have discontinue the diuretic. She will continue on oral potassium replacement therapy.

## 2014-05-23 NOTE — Progress Notes (Signed)
[place] OFFICE PROGRESS NOTE  Patient Care Team: Gaynelle Arabian, MD as PCP - General (Family Medicine) Brooks Sailors, RN as Oncology Nurse [place], RD as Dietitian (Nutrition) Eppie Gibson, MD as Attending Physician (Radiation Oncology)  SUMMARY OF ONCOLOGIC HISTORY: Oncology History   Cancer of base of tongue   Staging form: Lip and Oral Cavity, AJCC 7th Edition     Clinical: Stage IVA (T3, N2b, M0) - Signed by Heath Lark, MD on 05/09/2014       Cancer of base of tongue   04/20/2014 Imaging CT neck showed advanced base of tongue mass and multiple right neck lymphadenopathy   04/23/2014 Pathology Results [id number] FNA of right neck is positive for squamous cell cancer, P16 positive   05/09/2014 Imaging PET/CT scan confirmed base of tongue cancer along with lymph node metastasis.   05/10/2014 Procedure She has placement of PICC line.   05/11/2014 -  Chemotherapy She received induction chemotherapy with Taxotere, cisplatin and 5-FU.   05/19/2014 - 05/22/2014 Hospital Admission She was admitted to the hospital for management of neutropenic fever, GI bleed requiring blood transfusion and diarrhea from chemotherapy    INTERVAL HISTORY: Please see below for problem oriented charting. She is seen as part of hospital follow-up. She has small upper respiratory tract congestion. Denies sore throat or cough. She denies further melana. Diarrhea has stopped. She is eating well. REVIEW OF SYSTEMS:   Constitutional: Denies fevers, chills or abnormal weight loss Eyes: Denies blurriness of vision Ears, nose, mouth, throat, and face: Denies mucositis or sore throat Respiratory: Denies cough, dyspnea or wheezes Cardiovascular: Denies palpitation, chest discomfort or lower extremity swelling Gastrointestinal:  Denies nausea, heartburn or change in bowel habits Skin: Denies abnormal skin rashes Lymphatics: Denies new lymphadenopathy or easy  bruising Neurological:Denies numbness, tingling or new weaknesses Behavioral/Psych: Mood is stable, no new changes  All other systems were reviewed with the patient and are negative.  I have reviewed the past medical history, past surgical history, social history and family history with the patient and they are unchanged from previous note.  ALLERGIES:  is allergic to penicillins.  MEDICATIONS:  Current Outpatient Prescriptions  Medication Sig Dispense Refill  . ALPRAZolam (XANAX) 0.25 MG tablet Take 0.25 mg by mouth as needed for anxiety.    Marland Kitchen amLODipine (NORVASC) 10 MG tablet Take 10 mg by mouth daily.    Marland Kitchen atorvastatin (LIPITOR) 10 MG tablet Take 10 mg by mouth daily.    . ciprofloxacin (CIPRO) 500 MG tablet Take 1 tablet (500 mg total) by mouth 2 (two) times daily. 10 tablet 0  . dexamethasone (DECADRON) 4 MG tablet Start the day before and the day after chemotherapy, 2 times a day every 3 weeks 30 tablet 1  . lidocaine-prilocaine (EMLA) cream Apply to affected area once 30 g 3  . loperamide (IMODIUM) 2 MG capsule Take 2 mg by mouth as needed for diarrhea or loose stools.    Marland Kitchen losartan-hydrochlorothiazide (HYZAAR) 100-12.5 MG per tablet Take 1 tablet by mouth daily.    . metFORMIN (GLUCOPHAGE) 500 MG tablet Take 500 mg by mouth daily with breakfast.    . ondansetron (ZOFRAN) 8 MG tablet Take 1 tablet (8 mg total) by mouth every 8 (eight) hours as needed (Nausea or vomiting). (Patient taking differently: Take 8 mg by mouth every 8 (eight) hours as needed for nausea or vomiting. ) 30 tablet 1  . pantoprazole (PROTONIX) 40 MG tablet Take 1 tablet (40 mg  total) by mouth daily. 15 tablet 0  . phosphorus (K PHOS NEUTRAL) 155-852-130 MG tablet Take 1 tablet (250 mg total) by mouth 2 (two) times daily. 10 tablet 0  . Prenatal Vit-Fe Fumarate-FA (MULTIVITAMIN-PRENATAL) 27-0.8 MG TABS tablet Take 1 tablet by mouth daily at 12 noon.    Marland Kitchen PRESCRIPTION MEDICATION CHEMO [facility]    . prochlorperazine  (COMPAZINE) 10 MG tablet Take 1 tablet (10 mg total) by mouth every 6 (six) hours as needed (Nausea or vomiting). 30 tablet 1  . Sodium Chloride Flush 0.9 % SOLN injection Inject 10 mLs into the vein daily. 60 Syringe 3  . sodium fluoride (FLUORISHIELD) 1.1 % GEL dental gel Instill one drop of gel per tooth space of fluoride tray. Place over teeth for 5 minutes. Remove. Spit out excess. Repeat nightly. 120 mL prn   No current facility-administered medications for this visit.    PHYSICAL EXAMINATION: ECOG PERFORMANCE STATUS: 0 - Asymptomatic  Filed Vitals:   05/23/14 1449  BP: 123/61  Pulse: 103  Temp: 98.2 F (36.8 C)  Resp: 18   Filed Weights   05/23/14 1449  Weight: 203 lb (92.08 kg)    GENERAL:alert, no distress and comfortable SKIN: skin color is pale, texture, turgor are normal, no rashes or significant lesions EYES: normal, Conjunctiva are pale and non-injected, sclera clear OROPHARYNX:no exudate, no erythema and lips, buccal mucosa, and tongue normal  NECK: supple, thyroid normal size, non-tender, without nodularity LYMPH:  no palpable lymphadenopathy in the cervical, axillary or inguinal LUNGS: clear to auscultation and percussion with normal breathing effort HEART: regular rate & rhythm and no murmurs and no lower extremity edema ABDOMEN:abdomen soft, non-tender and normal bowel sounds Musculoskeletal:no cyanosis of digits and no clubbing  NEURO: alert & oriented x 3 with fluent speech, no focal motor/sensory deficits  LABORATORY DATA:  I have reviewed the data as listed    Component Value Date/Time   NA 140 05/23/2014 1407   NA 138 05/22/2014 0445   K 3.1* 05/23/2014 1407   K 3.0* 05/22/2014 0445   CL 101 05/22/2014 0445   CO2 26 05/23/2014 1407   CO2 26 05/22/2014 0445   GLUCOSE 136 05/23/2014 1407   GLUCOSE 144* 05/22/2014 0445   BUN 6.9* 05/23/2014 1407   BUN 5* 05/22/2014 0445   CREATININE 0.7 05/23/2014 1407   CREATININE 0.66 05/22/2014 0445    CALCIUM 8.5 05/23/2014 1407   CALCIUM 8.1* 05/22/2014 0445   PROT 5.9* 05/23/2014 1407   PROT 5.9* 05/20/2014 0600   ALBUMIN 2.8* 05/23/2014 1407   ALBUMIN 2.5* 05/20/2014 0600   AST 29 05/23/2014 1407   AST 10 05/20/2014 0600   ALT 44 05/23/2014 1407   ALT 17 05/20/2014 0600   ALKPHOS 129 05/23/2014 1407   ALKPHOS 67 05/20/2014 0600   BILITOT 0.24 05/23/2014 1407   BILITOT 0.3 05/20/2014 0600   GFRNONAA >90 05/22/2014 0445   GFRAA >90 05/22/2014 0445    No results found for: SPEP, UPEP  Lab Results  Component Value Date   WBC 14.0* 05/23/2014   NEUTROABS 10.7* 05/23/2014   HGB 8.8* 05/23/2014   HCT 25.8* 05/23/2014   MCV 88.7 05/23/2014   PLT 153 05/23/2014      Chemistry      Component Value Date/Time   NA 140 05/23/2014 1407   NA 138 05/22/2014 0445   K 3.1* 05/23/2014 1407   K 3.0* 05/22/2014 0445   CL 101 05/22/2014 0445   CO2 26 05/23/2014 1407  CO2 26 05/22/2014 0445   BUN 6.9* 05/23/2014 1407   BUN 5* 05/22/2014 0445   CREATININE 0.7 05/23/2014 1407   CREATININE 0.66 05/22/2014 0445      Component Value Date/Time   CALCIUM 8.5 05/23/2014 1407   CALCIUM 8.1* 05/22/2014 0445   ALKPHOS 129 05/23/2014 1407   ALKPHOS 67 05/20/2014 0600   AST 29 05/23/2014 1407   AST 10 05/20/2014 0600   ALT 44 05/23/2014 1407   ALT 17 05/20/2014 0600   BILITOT 0.24 05/23/2014 1407   BILITOT 0.3 05/20/2014 0600      ASSESSMENT & PLAN:  Cancer of base of tongue I shall continue aggressive supportive care. She is recovering from recent neutropenic fever and oral bleeding. I will see her again next week prior to cycle 2 of therapy.  Bleeding gastrointestinal This has resolved. Her hemoglobin is higher. I reassured to patient.  Hypertension Her blood pressure is a little low. Recommend she discontinue her blood pressure medicine with diuretics but to continue on Norvasc.  S/P PICC central line placement We get her dressing changed today. She will continue PICC  line flushes at home.  Diarrhea This is likely related to recent melena and side effects of chemotherapy. I plan to reduce 5-FU infusion by 20% in the future cycles.  URI (upper respiratory infection) This is mild, suspect upper respiratory tract infection. She is on ciprofloxacin.  Hypokalemia, gastrointestinal losses She will continue taking Imodium as needed to control diarrhea. I have discontinue the diuretic. She will continue on oral potassium replacement therapy.     Orders Placed This Encounter  Procedures  . Hold Tube, Blood Bank    Standing Status: Future     Number of Occurrences:      Standing Expiration Date: 06/27/2015   All questions were answered. The patient knows to call the clinic with any problems, questions or concerns. No barriers to learning was detected. I spent 30 minutes counseling the patient face to face. The total time spent in the appointment was 40 minutes and more than 50% was on counseling and review of test results     [facility], [place], MD 05/23/2014 9:10 PM

## 2014-05-23 NOTE — Telephone Encounter (Signed)
-----   Message from Heath Lark, MD sent at 05/23/2014  3:56 PM EST ----- Regarding: K Her potassium is improving. Continue the potassium/phosphorus replacement until it is finished ----- Message -----    From: [facility] Interface    Sent: 05/23/2014   2:32 PM      To: Heath Lark, MD

## 2014-05-23 NOTE — Assessment & Plan Note (Signed)
This is mild, suspect upper respiratory tract infection. She is on ciprofloxacin.

## 2014-05-25 LAB — CULTURE, BLOOD (ROUTINE X 2)
Culture: NO GROWTH
Culture: NO GROWTH

## 2014-05-28 ENCOUNTER — Ambulatory Visit: Payer: BC Managed Care – PPO

## 2014-05-28 ENCOUNTER — Ambulatory Visit
Admission: RE | Admit: 2014-05-28 | Discharge: 2014-05-28 | Disposition: A | Discharge status: Home / Self Care | Payer: BC Managed Care – PPO | Source: Ambulatory Visit | Attending: Radiation Oncology | Admitting: Radiation Oncology

## 2014-05-28 ENCOUNTER — Ambulatory Visit: Payer: No Typology Code available for payment source

## 2014-05-28 ENCOUNTER — Ambulatory Visit (HOSPITAL_BASED_OUTPATIENT_CLINIC_OR_DEPARTMENT_OTHER): Payer: BC Managed Care – PPO

## 2014-05-28 DIAGNOSIS — C01 Malignant neoplasm of base of tongue: Secondary | ICD-10-CM

## 2014-05-28 DIAGNOSIS — R634 Abnormal weight loss: Secondary | ICD-10-CM

## 2014-05-28 DIAGNOSIS — Z452 Encounter for adjustment and management of vascular access device: Secondary | ICD-10-CM

## 2014-05-28 LAB — TSH CHCC: TSH: 1.404 m[IU]/L (ref 0.308–3.960)

## 2014-05-28 MED ORDER — HEPARIN SOD (PORK) LOCK FLUSH 100 UNIT/ML IV SOLN
500.0000 [IU] | Freq: Once | INTRAVENOUS | Status: AC
  Administered 2014-05-28: 250 [IU] via INTRAVENOUS
  Filled 2014-05-28: qty 5

## 2014-05-28 MED ORDER — SODIUM CHLORIDE 0.9 % IJ SOLN
10.0000 mL | INTRAMUSCULAR | Status: DC | PRN
  Administered 2014-05-28: 10 mL via INTRAVENOUS
  Filled 2014-05-28: qty 10

## 2014-05-28 NOTE — Patient Instructions (Signed)
PICC Home Guide A peripherally inserted central catheter (PICC) is a long, thin, flexible tube that is inserted into a vein in the upper arm. It is a form of intravenous (IV) access. It is considered to be a "central" line because the tip of the PICC ends in a large vein in your chest. This large vein is called the superior vena cava (SVC). The PICC tip ends in the SVC because there is a lot of blood flow in the SVC. This allows medicines and IV fluids to be quickly distributed throughout the body. The PICC is inserted using a sterile technique by a specially trained nurse or physician. After the PICC is inserted, a chest X-ray exam is done to be sure it is in the correct place.  A PICC may be placed for different reasons, such as:  To give medicines and liquid nutrition that can only be given through a central line. Examples are:  Certain antibiotic treatments.  Chemotherapy.  Total parenteral nutrition (TPN).  To take frequent blood samples.  To give IV fluids and blood products.  If there is difficulty placing a peripheral intravenous (PIV) catheter. If taken care of properly, a PICC can remain in place for several months. A PICC can also allow a person to go home from the hospital early. Medicine and PICC care can be managed at home by a family member or home health care team. WHAT PROBLEMS CAN HAPPEN WHEN I HAVE A PICC? Problems with a PICC can occasionally occur. These may include the following:  A blood clot (thrombus) forming in or at the tip of the PICC. This can cause the PICC to become clogged. A clot-dissolving medicine called tissue plasminogen activator (tPA) can be given through the PICC to help break up the clot.  Inflammation of the vein (phlebitis) in which the PICC is placed. Signs of inflammation may include redness, pain at the insertion site, red streaks, or being able to feel a "cord" in the vein where the PICC is located.  Infection in the PICC or at the insertion  site. Signs of infection may include fever, chills, redness, swelling, or pus drainage from the PICC insertion site.  PICC movement (malposition). The PICC tip may move from its original position due to excessive physical activity, forceful coughing, sneezing, or vomiting.  A break or cut in the PICC. It is important to not use scissors near the PICC.  Nerve or tendon irritation or injury during PICC insertion. WHAT SHOULD I KEEP IN MIND ABOUT ACTIVITIES WHEN I HAVE A PICC?  You may bend your arm and move it freely. If your PICC is near or at the bend of your elbow, avoid activity with repeated motion at the elbow.  Rest at home for the remainder of the day following PICC line insertion.  Avoid lifting heavy objects as instructed by your health care provider.  Avoid using a crutch with the arm on the same side as your PICC. You may need to use a walker. WHAT SHOULD I KNOW ABOUT MY PICC DRESSING?  Keep your PICC bandage (dressing) clean and dry to prevent infection.  Ask your health care provider when you may shower. Ask your health care provider to teach you how to wrap the PICC when you do take a shower.  Change the PICC dressing as instructed by your health care provider.  Change your PICC dressing if it becomes loose or wet. WHAT SHOULD I KNOW ABOUT PICC CARE?  Check the PICC insertion site   daily for leakage, redness, swelling, or pain.  Do not take a bath, swim, or use hot tubs when you have a PICC. Cover PICC line with clear plastic wrap and tape to keep it dry while showering.  Flush the PICC as directed by your health care provider. Let your health care provider know right away if the PICC is difficult to flush or does not flush. Do not use force to flush the PICC.  Do not use a syringe that is less than 10 mL to flush the PICC.  Never pull or tug on the PICC.  Avoid blood pressure checks on the arm with the PICC.  Keep your PICC identification card with you at all  times.  Do not take the PICC out yourself. Only a trained clinical professional should remove the PICC. SEEK IMMEDIATE MEDICAL CARE IF:  Your PICC is accidentally pulled all the way out. If this happens, cover the insertion site with a bandage or gauze dressing. Do not throw the PICC away. Your health care provider will need to inspect it.  Your PICC was tugged or pulled and has partially come out. Do not  push the PICC back in.  There is any type of drainage, redness, or swelling where the PICC enters the skin.  You cannot flush the PICC, it is difficult to flush, or the PICC leaks around the insertion site when it is flushed.  You hear a "flushing" sound when the PICC is flushed.  You have pain, discomfort, or numbness in your arm, shoulder, or jaw on the same side as the PICC.  You feel your heart "racing" or skipping beats.  You notice a hole or tear in the PICC.  You develop chills or a fever. MAKE SURE YOU:   Understand these instructions.  Will watch your condition.  Will get help right away if you are not doing well or get worse. Document Released: 12/13/2002 Document Revised: 10/23/2013 Document Reviewed: 02/13/2013 Exit[employer] Patient Information 2015 [employer]. This information is not intended to replace advice given to you by your health care provider. Make sure you discuss any questions you have with your health care provider.  

## 2014-05-30 ENCOUNTER — Other Ambulatory Visit: Payer: Self-pay

## 2014-05-30 ENCOUNTER — Ambulatory Visit (HOSPITAL_BASED_OUTPATIENT_CLINIC_OR_DEPARTMENT_OTHER): Payer: BC Managed Care – PPO

## 2014-05-30 DIAGNOSIS — Z95828 Presence of other vascular implants and grafts: Secondary | ICD-10-CM

## 2014-05-30 DIAGNOSIS — C01 Malignant neoplasm of base of tongue: Secondary | ICD-10-CM

## 2014-05-30 DIAGNOSIS — Z452 Encounter for adjustment and management of vascular access device: Secondary | ICD-10-CM

## 2014-05-30 MED ORDER — UNABLE TO FIND: Status: DC

## 2014-05-30 MED ORDER — HEPARIN SOD (PORK) LOCK FLUSH 100 UNIT/ML IV SOLN
500.0000 [IU] | Freq: Once | INTRAVENOUS | Status: AC
Start: 2014-05-30 — End: 2014-05-30
  Administered 2014-05-30: 250 [IU] via INTRAVENOUS
  Filled 2014-05-30: qty 5

## 2014-05-30 MED ORDER — SODIUM CHLORIDE 0.9 % IJ SOLN
10.0000 mL | INTRAMUSCULAR | Status: DC | PRN
  Administered 2014-05-30: 10 mL via INTRAVENOUS
  Filled 2014-05-30: qty 10

## 2014-05-30 NOTE — Patient Instructions (Signed)
PICC Home Guide A peripherally inserted central catheter (PICC) is a long, thin, flexible tube that is inserted into a vein in the upper arm. It is a form of intravenous (IV) access. It is considered to be a "central" line because the tip of the PICC ends in a large vein in your chest. This large vein is called the superior vena cava (SVC). The PICC tip ends in the SVC because there is a lot of blood flow in the SVC. This allows medicines and IV fluids to be quickly distributed throughout the body. The PICC is inserted using a sterile technique by a specially trained nurse or physician. After the PICC is inserted, a chest X-ray exam is done to be sure it is in the correct place.  A PICC may be placed for different reasons, such as:  To give medicines and liquid nutrition that can only be given through a central line. Examples are:  Certain antibiotic treatments.  Chemotherapy.  Total parenteral nutrition (TPN).  To take frequent blood samples.  To give IV fluids and blood products.  If there is difficulty placing a peripheral intravenous (PIV) catheter. If taken care of properly, a PICC can remain in place for several months. A PICC can also allow a person to go home from the hospital early. Medicine and PICC care can be managed at home by a family member or home health care team. WHAT PROBLEMS CAN HAPPEN WHEN I HAVE A PICC? Problems with a PICC can occasionally occur. These may include the following:  A blood clot (thrombus) forming in or at the tip of the PICC. This can cause the PICC to become clogged. A clot-dissolving medicine called tissue plasminogen activator (tPA) can be given through the PICC to help break up the clot.  Inflammation of the vein (phlebitis) in which the PICC is placed. Signs of inflammation may include redness, pain at the insertion site, red streaks, or being able to feel a "cord" in the vein where the PICC is located.  Infection in the PICC or at the insertion  site. Signs of infection may include fever, chills, redness, swelling, or pus drainage from the PICC insertion site.  PICC movement (malposition). The PICC tip may move from its original position due to excessive physical activity, forceful coughing, sneezing, or vomiting.  A break or cut in the PICC. It is important to not use scissors near the PICC.  Nerve or tendon irritation or injury during PICC insertion. WHAT SHOULD I KEEP IN MIND ABOUT ACTIVITIES WHEN I HAVE A PICC?  You may bend your arm and move it freely. If your PICC is near or at the bend of your elbow, avoid activity with repeated motion at the elbow.  Rest at home for the remainder of the day following PICC line insertion.  Avoid lifting heavy objects as instructed by your health care provider.  Avoid using a crutch with the arm on the same side as your PICC. You may need to use a walker. WHAT SHOULD I KNOW ABOUT MY PICC DRESSING?  Keep your PICC bandage (dressing) clean and dry to prevent infection.  Ask your health care provider when you may shower. Ask your health care provider to teach you how to wrap the PICC when you do take a shower.  Change the PICC dressing as instructed by your health care provider.  Change your PICC dressing if it becomes loose or wet. WHAT SHOULD I KNOW ABOUT PICC CARE?  Check the PICC insertion site   daily for leakage, redness, swelling, or pain.  Do not take a bath, swim, or use hot tubs when you have a PICC. Cover PICC line with clear plastic wrap and tape to keep it dry while showering.  Flush the PICC as directed by your health care provider. Let your health care provider know right away if the PICC is difficult to flush or does not flush. Do not use force to flush the PICC.  Do not use a syringe that is less than 10 mL to flush the PICC.  Never pull or tug on the PICC.  Avoid blood pressure checks on the arm with the PICC.  Keep your PICC identification card with you at all  times.  Do not take the PICC out yourself. Only a trained clinical professional should remove the PICC. SEEK IMMEDIATE MEDICAL CARE IF:  Your PICC is accidentally pulled all the way out. If this happens, cover the insertion site with a bandage or gauze dressing. Do not throw the PICC away. Your health care provider will need to inspect it.  Your PICC was tugged or pulled and has partially come out. Do not  push the PICC back in.  There is any type of drainage, redness, or swelling where the PICC enters the skin.  You cannot flush the PICC, it is difficult to flush, or the PICC leaks around the insertion site when it is flushed.  You hear a "flushing" sound when the PICC is flushed.  You have pain, discomfort, or numbness in your arm, shoulder, or jaw on the same side as the PICC.  You feel your heart "racing" or skipping beats.  You notice a hole or tear in the PICC.  You develop chills or a fever. MAKE SURE YOU:   Understand these instructions.  Will watch your condition.  Will get help right away if you are not doing well or get worse. Document Released: 12/13/2002 Document Revised: 10/23/2013 Document Reviewed: 02/13/2013 Exit[employer] Patient Information 2015 [employer]. This information is not intended to replace advice given to you by your health care provider. Make sure you discuss any questions you have with your health care provider.  

## 2014-06-01 ENCOUNTER — Ambulatory Visit (HOSPITAL_BASED_OUTPATIENT_CLINIC_OR_DEPARTMENT_OTHER): Payer: BC Managed Care – PPO | Admitting: Hematology and Oncology

## 2014-06-01 ENCOUNTER — Ambulatory Visit (HOSPITAL_BASED_OUTPATIENT_CLINIC_OR_DEPARTMENT_OTHER): Payer: BC Managed Care – PPO

## 2014-06-01 ENCOUNTER — Ambulatory Visit: Payer: BC Managed Care – PPO | Admitting: Nutrition

## 2014-06-01 ENCOUNTER — Encounter: Payer: Self-pay | Admitting: Hematology and Oncology

## 2014-06-01 ENCOUNTER — Other Ambulatory Visit (HOSPITAL_BASED_OUTPATIENT_CLINIC_OR_DEPARTMENT_OTHER): Payer: BC Managed Care – PPO

## 2014-06-01 ENCOUNTER — Other Ambulatory Visit: Payer: Self-pay | Admitting: Hematology and Oncology

## 2014-06-01 ENCOUNTER — Telehealth: Payer: Self-pay | Admitting: Hematology and Oncology

## 2014-06-01 ENCOUNTER — Encounter: Payer: Self-pay | Admitting: *Deleted

## 2014-06-01 VITALS — BP 132/64 | HR 88 | Temp 97.2°F | Resp 18 | Ht 67.0 in | Wt 197.9 lb

## 2014-06-01 DIAGNOSIS — D630 Anemia in neoplastic disease: Secondary | ICD-10-CM | POA: Insufficient documentation

## 2014-06-01 DIAGNOSIS — Z5111 Encounter for antineoplastic chemotherapy: Secondary | ICD-10-CM

## 2014-06-01 DIAGNOSIS — K2961 Other gastritis with bleeding: Secondary | ICD-10-CM

## 2014-06-01 DIAGNOSIS — C01 Malignant neoplasm of base of tongue: Secondary | ICD-10-CM

## 2014-06-01 HISTORY — DX: Anemia in neoplastic disease: D63.0

## 2014-06-01 LAB — CBC WITH DIFFERENTIAL/PLATELET
BASO%: 0.3 % (ref 0.0–2.0)
Basophils Absolute: 0 10*3/uL (ref 0.0–0.1)
EOS ABS: 0 10*3/uL (ref 0.0–0.5)
EOS%: 0 % (ref 0.0–7.0)
HCT: 31 % — ABNORMAL LOW (ref 34.8–46.6)
HGB: 10 g/dL — ABNORMAL LOW (ref 11.6–15.9)
LYMPH#: 1 10*3/uL (ref 0.9–3.3)
LYMPH%: 7.9 % — AB (ref 14.0–49.7)
MCH: 29.8 pg (ref 25.1–34.0)
MCHC: 32.2 g/dL (ref 31.5–36.0)
MCV: 92.6 fL (ref 79.5–101.0)
MONO#: 0.3 10*3/uL (ref 0.1–0.9)
MONO%: 2.5 % (ref 0.0–14.0)
NEUT%: 89.3 % — ABNORMAL HIGH (ref 38.4–76.8)
NEUTROS ABS: 11.6 10*3/uL — AB (ref 1.5–6.5)
PLATELETS: 329 10*3/uL (ref 145–400)
RBC: 3.35 10*6/uL — AB (ref 3.70–5.45)
RDW: 15.4 % — ABNORMAL HIGH (ref 11.2–14.5)
WBC: 13 10*3/uL — AB (ref 3.9–10.3)

## 2014-06-01 LAB — COMPREHENSIVE METABOLIC PANEL (CC13)
ALBUMIN: 3.6 g/dL (ref 3.5–5.0)
ALT: 25 U/L (ref 0–55)
ANION GAP: 11 meq/L (ref 3–11)
AST: 13 U/L (ref 5–34)
Alkaline Phosphatase: 96 U/L (ref 40–150)
BUN: 14.4 mg/dL (ref 7.0–26.0)
CHLORIDE: 105 meq/L (ref 98–109)
CO2: 21 mEq/L — ABNORMAL LOW (ref 22–29)
CREATININE: 0.8 mg/dL (ref 0.6–1.1)
Calcium: 9.4 mg/dL (ref 8.4–10.4)
EGFR: 82 mL/min/{1.73_m2} — ABNORMAL LOW (ref >90)
Glucose: 259 mg/dl — ABNORMAL HIGH (ref 70–140)
POTASSIUM: 4.1 meq/L (ref 3.5–5.1)
Sodium: 137 mEq/L (ref 136–145)
Total Bilirubin: 0.27 mg/dL (ref 0.20–1.20)
Total Protein: 7 g/dL (ref 6.4–8.3)

## 2014-06-01 LAB — HOLD TUBE, BLOOD BANK

## 2014-06-01 MED ORDER — SODIUM CHLORIDE 0.9 % IJ SOLN
10.0000 mL | INTRAMUSCULAR | Status: DC | PRN
Start: 2014-06-01 — End: 2014-06-01
  Filled 2014-06-01: qty 10

## 2014-06-01 MED ORDER — SODIUM CHLORIDE 0.9 % IV SOLN
600.0000 mg/m2/d | INTRAVENOUS | Status: DC
  Administered 2014-06-01: 6450 mg via INTRAVENOUS
  Filled 2014-06-01: qty 129

## 2014-06-01 MED ORDER — DEXAMETHASONE SODIUM PHOSPHATE 20 MG/5ML IJ SOLN
12.0000 mg | Freq: Once | INTRAMUSCULAR | Status: AC
  Administered 2014-06-01: 12 mg via INTRAVENOUS

## 2014-06-01 MED ORDER — PALONOSETRON HCL INJECTION 0.25 MG/5ML
INTRAVENOUS | Status: AC
Start: 2014-06-01 — End: 2014-06-01
  Filled 2014-06-01: qty 5

## 2014-06-01 MED ORDER — DOCETAXEL CHEMO INJECTION 160 MG/16ML
75.0000 mg/m2 | Freq: Once | INTRAVENOUS | Status: AC
  Administered 2014-06-01: 160 mg via INTRAVENOUS
  Filled 2014-06-01: qty 16

## 2014-06-01 MED ORDER — ONDANSETRON HCL 8 MG PO TABS: 8.0000 mg | ORAL_TABLET | Freq: Three times a day (TID) | ORAL | Status: DC | PRN

## 2014-06-01 MED ORDER — POTASSIUM CHLORIDE 2 MEQ/ML IV SOLN
Freq: Once | INTRAVENOUS | Status: AC
  Administered 2014-06-01: 10:00:00 via INTRAVENOUS
  Filled 2014-06-01: qty 10

## 2014-06-01 MED ORDER — SODIUM CHLORIDE 0.9 % IV SOLN
75.0000 mg/m2 | Freq: Once | INTRAVENOUS | Status: AC
  Administered 2014-06-01: 161 mg via INTRAVENOUS
  Filled 2014-06-01: qty 161

## 2014-06-01 MED ORDER — SODIUM CHLORIDE 0.9 % IV SOLN
Freq: Once | INTRAVENOUS | Status: AC
  Administered 2014-06-01: 10:00:00 via INTRAVENOUS

## 2014-06-01 MED ORDER — PANTOPRAZOLE SODIUM 40 MG PO TBEC: 40.0000 mg | DELAYED_RELEASE_TABLET | Freq: Every day | ORAL | Status: DC

## 2014-06-01 MED ORDER — PALONOSETRON HCL INJECTION 0.25 MG/5ML
0.2500 mg | Freq: Once | INTRAVENOUS | Status: AC
  Administered 2014-06-01: 0.25 mg via INTRAVENOUS

## 2014-06-01 MED ORDER — SODIUM CHLORIDE 0.9 % IV SOLN
150.0000 mg | Freq: Once | INTRAVENOUS | Status: AC
  Administered 2014-06-01: 150 mg via INTRAVENOUS
  Filled 2014-06-01: qty 5

## 2014-06-01 MED ORDER — DEXAMETHASONE SODIUM PHOSPHATE 20 MG/5ML IJ SOLN
INTRAMUSCULAR | Status: AC
Start: 2014-06-01 — End: 2014-06-01
  Filled 2014-06-01: qty 5

## 2014-06-01 MED ORDER — HEPARIN SOD (PORK) LOCK FLUSH 100 UNIT/ML IV SOLN
500.0000 [IU] | Freq: Once | INTRAVENOUS | Status: DC | PRN
  Filled 2014-06-01: qty 5

## 2014-06-01 MED ORDER — LEVOFLOXACIN 500 MG PO TABS: 500.0000 mg | ORAL_TABLET | Freq: Every day | ORAL | Status: DC

## 2014-06-01 NOTE — Progress Notes (Signed)
Nutrition follow up completed. Patient reports she has a good appetite.  She does report taste alterations. Reports episode of diarrhea with hospital admission.  States diarrhea has now resolved and she continues to improve oral intake. Weight documented as 197.9 pounds December 11, which is decreased from 200.1 pounds.  Nutrition diagnosis: Predicted suboptimal energy intake continues.  Intervention: Patient educated to continue to consume, high-calorie, high-protein foods in small, frequent meals daily. Recommended patient strive for weight maintenance. Encouraged increased fluids and protein. Questions were answered.  Teach back method used.  Monitoring, evaluation, goals: Patient will tolerate adequate calories and proteins for minimal weight loss throughout treatment.    Next visit:Thursday, December 31, during chemotherapy.  **Disclaimer: This note was dictated with voice recognition software. Similar sounding words can inadvertently be transcribed and this note may contain transcription errors which may not have been corrected upon publication of note.**

## 2014-06-01 NOTE — Patient Instructions (Signed)
Docetaxel injection What is this medicine? DOCETAXEL (doe se TAX el) is a chemotherapy drug. It targets fast dividing cells, like cancer cells, and causes these cells to die. This medicine is used to treat many types of cancers like breast cancer, certain stomach cancers, head and neck cancer, lung cancer, and prostate cancer. This medicine may be used for other purposes; ask your health care provider or pharmacist if you have questions. COMMON BRAND NAME(S): Docefrez, Taxotere What should I tell my health care provider before I take this medicine? They need to know if you have any of these conditions: -infection (especially a virus infection such as chickenpox, cold sores, or herpes) -liver disease -low blood counts, like low white cell, platelet, or red cell counts -an unusual or allergic reaction to docetaxel, polysorbate 80, other chemotherapy agents, other medicines, foods, dyes, or preservatives -pregnant or trying to get pregnant -breast-feeding How should I use this medicine? This drug is given as an infusion into a vein. It is administered in a hospital or clinic by a specially trained health care professional. Talk to your pediatrician regarding the use of this medicine in children. Special care may be needed. Overdosage: If you think you have taken too much of this medicine contact a poison control center or emergency room at once. NOTE: This medicine is only for you. Do not share this medicine with others. What if I miss a dose? It is important not to miss your dose. Call your doctor or health care professional if you are unable to keep an appointment. What may interact with this medicine? -cyclosporine -erythromycin -ketoconazole -medicines to increase blood counts like filgrastim, pegfilgrastim, sargramostim -vaccines Talk to your doctor or health care professional before taking any of these medicines: -acetaminophen -aspirin -ibuprofen -ketoprofen -naproxen This list  may not describe all possible interactions. Give your health care provider a list of all the medicines, herbs, non-prescription drugs, or dietary supplements you use. Also tell them if you smoke, drink alcohol, or use illegal drugs. Some items may interact with your medicine. What should I watch for while using this medicine? Your condition will be monitored carefully while you are receiving this medicine. You will need important blood work done while you are taking this medicine. This drug may make you feel generally unwell. This is not uncommon, as chemotherapy can affect healthy cells as well as cancer cells. Report any side effects. Continue your course of treatment even though you feel ill unless your doctor tells you to stop. In some cases, you may be given additional medicines to help with side effects. Follow all directions for their use. Call your doctor or health care professional for advice if you get a fever, chills or sore throat, or other symptoms of a cold or flu. Do not treat yourself. This drug decreases your body's ability to fight infections. Try to avoid being around people who are sick. This medicine may increase your risk to bruise or bleed. Call your doctor or health care professional if you notice any unusual bleeding. Be careful brushing and flossing your teeth or using a toothpick because you may get an infection or bleed more easily. If you have any dental work done, tell your dentist you are receiving this medicine. Avoid taking products that contain aspirin, acetaminophen, ibuprofen, naproxen, or ketoprofen unless instructed by your doctor. These medicines may hide a fever. This medicine contains an alcohol in the product. You may get drowsy or dizzy. Do not drive, use machinery, or do anything  that needs mental alertness until you know how this medicine affects you. Do not stand or sit up quickly, especially if you are an older patient. This reduces the risk of dizzy or  fainting spells. Avoid alcoholic drinks Do not become pregnant while taking this medicine. Women should inform their doctor if they wish to become pregnant or think they might be pregnant. There is a potential for serious side effects to an unborn child. Talk to your health care professional or pharmacist for more information. Do not breast-feed an infant while taking this medicine. What side effects may I notice from receiving this medicine? Side effects that you should report to your doctor or health care professional as soon as possible: -allergic reactions like skin rash, itching or hives, swelling of the face, lips, or tongue -low blood counts - This drug may decrease the number of white blood cells, red blood cells and platelets. You may be at increased risk for infections and bleeding. -signs of infection - fever or chills, cough, sore throat, pain or difficulty passing urine -signs of decreased platelets or bleeding - bruising, pinpoint red spots on the skin, black, tarry stools, nosebleeds -signs of decreased red blood cells - unusually weak or tired, fainting spells, lightheadedness -breathing problems -fast or irregular heartbeat -low blood pressure -mouth sores -nausea and vomiting -pain, swelling, redness or irritation at the injection site -pain, tingling, numbness in the hands or feet -swelling of the ankle, feet, hands -weight gain Side effects that usually do not require medical attention (report to your prescriber or health care professional if they continue or are bothersome): -bone pain -complete hair loss including hair on your head, underarms, pubic hair, eyebrows, and eyelashes -diarrhea -excessive tearing -changes in the color of fingernails -loosening of the fingernails -nausea -muscle pain -red flush to skin -sweating -weak or tired This list may not describe all possible side effects. Call your doctor for medical advice about side effects. You may report side  effects to FDA at 1-800-FDA-1088. Where should I keep my medicine? This drug is given in a hospital or clinic and will not be stored at home. NOTE: This sheet is a summary. It may not cover all possible information. If you have questions about this medicine, talk to your doctor, pharmacist, or health care provider.  2015, [employer]. (2013-05-04 22:21:02) Fluorouracil, 5-FU injection What is this medicine? FLUOROURACIL, 5-FU (flure oh YOOR a sil) is a chemotherapy drug. It slows the growth of cancer cells. This medicine is used to treat many types of cancer like breast cancer, colon or rectal cancer, pancreatic cancer, and stomach cancer. This medicine may be used for other purposes; ask your health care provider or pharmacist if you have questions. COMMON BRAND NAME(S): Adrucil What should I tell my health care provider before I take this medicine? They need to know if you have any of these conditions: -blood disorders -dihydropyrimidine dehydrogenase (DPD) deficiency -infection (especially a virus infection such as chickenpox, cold sores, or herpes) -kidney disease -liver disease -malnourished, poor nutrition -recent or ongoing radiation therapy -an unusual or allergic reaction to fluorouracil, other chemotherapy, other medicines, foods, dyes, or preservatives -pregnant or trying to get pregnant -breast-feeding How should I use this medicine? This drug is given as an infusion or injection into a vein. It is administered in a hospital or clinic by a specially trained health care professional. Talk to your pediatrician regarding the use of this medicine in children. Special care may be needed. Overdosage: If you  think you have taken too much of this medicine contact a poison control center or emergency room at once. NOTE: This medicine is only for you. Do not share this medicine with others. What if I miss a dose? It is important not to miss your dose. Call your doctor or  health care professional if you are unable to keep an appointment. What may interact with this medicine? -allopurinol -cimetidine -dapsone -digoxin -hydroxyurea -leucovorin -levamisole -medicines for seizures like ethotoin, fosphenytoin, phenytoin -medicines to increase blood counts like filgrastim, pegfilgrastim, sargramostim -medicines that treat or prevent blood clots like warfarin, enoxaparin, and dalteparin -methotrexate -metronidazole -pyrimethamine -some other chemotherapy drugs like busulfan, cisplatin, estramustine, vinblastine -trimethoprim -trimetrexate -vaccines Talk to your doctor or health care professional before taking any of these medicines: -acetaminophen -aspirin -ibuprofen -ketoprofen -naproxen This list may not describe all possible interactions. Give your health care provider a list of all the medicines, herbs, non-prescription drugs, or dietary supplements you use. Also tell them if you smoke, drink alcohol, or use illegal drugs. Some items may interact with your medicine. What should I watch for while using this medicine? Visit your doctor for checks on your progress. This drug may make you feel generally unwell. This is not uncommon, as chemotherapy can affect healthy cells as well as cancer cells. Report any side effects. Continue your course of treatment even though you feel ill unless your doctor tells you to stop. In some cases, you may be given additional medicines to help with side effects. Follow all directions for their use. Call your doctor or health care professional for advice if you get a fever, chills or sore throat, or other symptoms of a cold or flu. Do not treat yourself. This drug decreases your body's ability to fight infections. Try to avoid being around people who are sick. This medicine may increase your risk to bruise or bleed. Call your doctor or health care professional if you notice any unusual bleeding. Be careful brushing and flossing  your teeth or using a toothpick because you may get an infection or bleed more easily. If you have any dental work done, tell your dentist you are receiving this medicine. Avoid taking products that contain aspirin, acetaminophen, ibuprofen, naproxen, or ketoprofen unless instructed by your doctor. These medicines may hide a fever. Do not become pregnant while taking this medicine. Women should inform their doctor if they wish to become pregnant or think they might be pregnant. There is a potential for serious side effects to an unborn child. Talk to your health care professional or pharmacist for more information. Do not breast-feed an infant while taking this medicine. Men should inform their doctor if they wish to father a child. This medicine may lower sperm counts. Do not treat diarrhea with over the counter products. Contact your doctor if you have diarrhea that lasts more than 2 days or if it is severe and watery. This medicine can make you more sensitive to the sun. Keep out of the sun. If you cannot avoid being in the sun, wear protective clothing and use sunscreen. Do not use sun lamps or tanning beds/booths. What side effects may I notice from receiving this medicine? Side effects that you should report to your doctor or health care professional as soon as possible: -allergic reactions like skin rash, itching or hives, swelling of the face, lips, or tongue -low blood counts - this medicine may decrease the number of white blood cells, red blood cells and platelets. You may be  at increased risk for infections and bleeding. -signs of infection - fever or chills, cough, sore throat, pain or difficulty passing urine -signs of decreased platelets or bleeding - bruising, pinpoint red spots on the skin, black, tarry stools, blood in the urine -signs of decreased red blood cells - unusually weak or tired, fainting spells, lightheadedness -breathing problems -changes in vision -chest pain -mouth  sores -nausea and vomiting -pain, swelling, redness at site where injected -pain, tingling, numbness in the hands or feet -redness, swelling, or sores on hands or feet -stomach pain -unusual bleeding Side effects that usually do not require medical attention (report to your doctor or health care professional if they continue or are bothersome): -changes in finger or toe nails -diarrhea -dry or itchy skin -hair loss -headache -loss of appetite -sensitivity of eyes to the light -stomach upset -unusually teary eyes This list may not describe all possible side effects. Call your doctor for medical advice about side effects. You may report side effects to FDA at 1-800-FDA-1088. Where should I keep my medicine? This drug is given in a hospital or clinic and will not be stored at home. NOTE: This sheet is a summary. It may not cover all possible information. If you have questions about this medicine, talk to your doctor, pharmacist, or health care provider.  2015, [employer]. (2007-10-12 13:53:16) Cisplatin injection What is this medicine? CISPLATIN (SIS pla tin) is a chemotherapy drug. It targets fast dividing cells, like cancer cells, and causes these cells to die. This medicine is used to treat many types of cancer like bladder, ovarian, and testicular cancers. This medicine may be used for other purposes; ask your health care provider or pharmacist if you have questions. COMMON BRAND NAME(S): Platinol, Platinol -AQ What should I tell my health care provider before I take this medicine? They need to know if you have any of these conditions: -blood disorders -hearing problems -kidney disease -recent or ongoing radiation therapy -an unusual or allergic reaction to cisplatin, carboplatin, other chemotherapy, other medicines, foods, dyes, or preservatives -pregnant or trying to get pregnant -breast-feeding How should I use this medicine? This drug is given as an infusion  into a vein. It is administered in a hospital or clinic by a specially trained health care professional. Talk to your pediatrician regarding the use of this medicine in children. Special care may be needed. Overdosage: If you think you have taken too much of this medicine contact a poison control center or emergency room at once. NOTE: This medicine is only for you. Do not share this medicine with others. What if I miss a dose? It is important not to miss a dose. Call your doctor or health care professional if you are unable to keep an appointment. What may interact with this medicine? -dofetilide -foscarnet -medicines for seizures -medicines to increase blood counts like filgrastim, pegfilgrastim, sargramostim -probenecid -pyridoxine used with altretamine -rituximab -some antibiotics like amikacin, gentamicin, neomycin, polymyxin B, streptomycin, tobramycin -sulfinpyrazone -vaccines -zalcitabine Talk to your doctor or health care professional before taking any of these medicines: -acetaminophen -aspirin -ibuprofen -ketoprofen -naproxen This list may not describe all possible interactions. Give your health care provider a list of all the medicines, herbs, non-prescription drugs, or dietary supplements you use. Also tell them if you smoke, drink alcohol, or use illegal drugs. Some items may interact with your medicine. What should I watch for while using this medicine? Your condition will be monitored carefully while you are receiving this medicine. You will  need important blood work done while you are taking this medicine. This drug may make you feel generally unwell. This is not uncommon, as chemotherapy can affect healthy cells as well as cancer cells. Report any side effects. Continue your course of treatment even though you feel ill unless your doctor tells you to stop. In some cases, you may be given additional medicines to help with side effects. Follow all directions for their  use. Call your doctor or health care professional for advice if you get a fever, chills or sore throat, or other symptoms of a cold or flu. Do not treat yourself. This drug decreases your body's ability to fight infections. Try to avoid being around people who are sick. This medicine may increase your risk to bruise or bleed. Call your doctor or health care professional if you notice any unusual bleeding. Be careful brushing and flossing your teeth or using a toothpick because you may get an infection or bleed more easily. If you have any dental work done, tell your dentist you are receiving this medicine. Avoid taking products that contain aspirin, acetaminophen, ibuprofen, naproxen, or ketoprofen unless instructed by your doctor. These medicines may hide a fever. Do not become pregnant while taking this medicine. Women should inform their doctor if they wish to become pregnant or think they might be pregnant. There is a potential for serious side effects to an unborn child. Talk to your health care professional or pharmacist for more information. Do not breast-feed an infant while taking this medicine. Drink fluids as directed while you are taking this medicine. This will help protect your kidneys. Call your doctor or health care professional if you get diarrhea. Do not treat yourself. What side effects may I notice from receiving this medicine? Side effects that you should report to your doctor or health care professional as soon as possible: -allergic reactions like skin rash, itching or hives, swelling of the face, lips, or tongue -signs of infection - fever or chills, cough, sore throat, pain or difficulty passing urine -signs of decreased platelets or bleeding - bruising, pinpoint red spots on the skin, black, tarry stools, nosebleeds -signs of decreased red blood cells - unusually weak or tired, fainting spells, lightheadedness -breathing problems -changes in hearing -gout pain -low blood  counts - This drug may decrease the number of white blood cells, red blood cells and platelets. You may be at increased risk for infections and bleeding. -nausea and vomiting -pain, swelling, redness or irritation at the injection site -pain, tingling, numbness in the hands or feet -problems with balance, movement -trouble passing urine or change in the amount of urine Side effects that usually do not require medical attention (report to your doctor or health care professional if they continue or are bothersome): -changes in vision -loss of appetite -metallic taste in the mouth or changes in taste This list may not describe all possible side effects. Call your doctor for medical advice about side effects. You may report side effects to FDA at 1-800-FDA-1088. Where should I keep my medicine? This drug is given in a hospital or clinic and will not be stored at home. NOTE: This sheet is a summary. It may not cover all possible information. If you have questions about this medicine, talk to your doctor, pharmacist, or health care provider.  2015, [employer]. (2007-09-13 14:40:54)

## 2014-06-01 NOTE — Telephone Encounter (Signed)
gv adn printed appts ched and avs for pt for DEC °

## 2014-06-03 NOTE — Assessment & Plan Note (Signed)
This is likely due to recent treatment. The patient denies recent history of bleeding such as epistaxis, hematuria or hematochezia. She is asymptomatic from the anemia. I will observe for now.  She does not require transfusion now. I will continue the chemotherapy at mild 5-FU dosage adjustment.  If the anemia gets progressive worse in the future, I might have to delay her treatment or adjust the chemotherapy dose.

## 2014-06-03 NOTE — Assessment & Plan Note (Signed)
Overall, she has fully recovered from prior side effects of treatment. We will proceed with cycle 2 of therapy today with minor dose adjustment of 5-FU infusion. I will continue to monitor the patient on the weekly basis for supportive care.

## 2014-06-03 NOTE — Progress Notes (Signed)
[place] OFFICE PROGRESS NOTE  Patient Care Team: Gaynelle Arabian, MD as PCP - General (Family Medicine) Brooks Sailors, RN as Oncology Nurse [place], RD as Dietitian (Nutrition) Wyvonnia Lora, MD as Attending Physician (Radiation Oncology)  SUMMARY OF ONCOLOGIC HISTORY: Oncology History   Cancer of base of tongue   Staging form: Lip and Oral Cavity, AJCC 7th Edition     Clinical: Stage IVA (T3, N2b, M0) - Signed by Heath Lark, MD on 05/09/2014       Cancer of base of tongue   04/20/2014 Imaging CT neck showed advanced base of tongue mass and multiple right neck lymphadenopathy   04/23/2014 Pathology Results [id number] FNA of right neck is positive for squamous cell cancer, P16 positive   05/09/2014 Imaging PET/CT scan confirmed base of tongue cancer along with lymph node metastasis.   05/10/2014 Procedure She has placement of PICC line.   05/11/2014 -  Chemotherapy She received induction chemotherapy with Taxotere, cisplatin and 5-FU.   05/19/2014 - 05/22/2014 Hospital Admission She was admitted to the hospital for management of neutropenic fever, GI bleed requiring blood transfusion and diarrhea from chemotherapy    INTERVAL HISTORY: Please see below for problem oriented charting. She is doing well. Diarrhea has resolved. Her energy level is fair. Denies recent nausea or vomiting. Denies further hemoptysis. REVIEW OF SYSTEMS:   Constitutional: Denies fevers, chills or abnormal weight loss Eyes: Denies blurriness of vision Ears, nose, mouth, throat, and face: Denies mucositis or sore throat Respiratory: Denies cough, dyspnea or wheezes Cardiovascular: Denies palpitation, chest discomfort or lower extremity swelling Gastrointestinal:  Denies nausea, heartburn or change in bowel habits Skin: Denies abnormal skin rashes Lymphatics: Denies new lymphadenopathy or easy bruising Neurological:Denies numbness, tingling or new  weaknesses Behavioral/Psych: Mood is stable, no new changes  All other systems were reviewed with the patient and are negative.  I have reviewed the past medical history, past surgical history, social history and family history with the patient and they are unchanged from previous note.  ALLERGIES:  is allergic to penicillins.  MEDICATIONS:  Current Outpatient Prescriptions  Medication Sig Dispense Refill  . ALPRAZolam (XANAX) 0.25 MG tablet Take 0.25 mg by mouth as needed for anxiety.    Marland Kitchen amLODipine (NORVASC) 10 MG tablet Take 10 mg by mouth daily.    Marland Kitchen atorvastatin (LIPITOR) 10 MG tablet Take 10 mg by mouth daily.    Marland Kitchen dexamethasone (DECADRON) 4 MG tablet Start the day before and the day after chemotherapy, 2 times a day every 3 weeks 30 tablet 1  . lidocaine-prilocaine (EMLA) cream Apply to affected area once 30 g 3  . loperamide (IMODIUM) 2 MG capsule Take 2 mg by mouth as needed for diarrhea or loose stools.    . metFORMIN (GLUCOPHAGE) 500 MG tablet Take 500 mg by mouth daily with breakfast.    . ondansetron (ZOFRAN) 8 MG tablet Take 1 tablet (8 mg total) by mouth every 8 (eight) hours as needed (Nausea or vomiting). 90 tablet 1  . pantoprazole (PROTONIX) 40 MG tablet Take 1 tablet (40 mg total) by mouth daily. 90 tablet 2  . phosphorus (K PHOS NEUTRAL) 155-852-130 MG tablet Take 1 tablet (250 mg total) by mouth 2 (two) times daily. 10 tablet 0  . Prenatal Vit-Fe Fumarate-FA (MULTIVITAMIN-PRENATAL) 27-0.8 MG TABS tablet Take 1 tablet by mouth daily at 12 noon.    Marland Kitchen PRESCRIPTION MEDICATION CHEMO [facility]    . prochlorperazine (COMPAZINE) 10 MG tablet  Take 1 tablet (10 mg total) by mouth every 6 (six) hours as needed (Nausea or vomiting). 30 tablet 1  . Sodium Chloride Flush 0.9 % SOLN injection Inject 10 mLs into the vein daily. 60 Syringe 3  . sodium fluoride (FLUORISHIELD) 1.1 % GEL dental gel Instill one drop of gel per tooth space of fluoride tray. Place over teeth for 5 minutes.  Remove. Spit out excess. Repeat nightly. 120 mL prn  . UNABLE TO FIND Med Name: wigs 2 each 0  . levofloxacin (LEVAQUIN) 500 MG tablet Take 1 tablet (500 mg total) by mouth daily. 10 tablet 0   No current facility-administered medications for this visit.    PHYSICAL EXAMINATION: ECOG PERFORMANCE STATUS: 0 - Asymptomatic  Filed Vitals:   06/01/14 0835  BP: 132/64  Pulse: 88  Temp: 97.2 F (36.2 C)  Resp: 18   Filed Weights   06/01/14 0835  Weight: 197 lb 14.4 oz (89.767 kg)    GENERAL:alert, no distress and comfortable SKIN: skin color, texture, turgor are normal, no rashes or significant lesions EYES: normal, Conjunctiva are pink and non-injected, sclera clear OROPHARYNX:no exudate, no erythema and lips, buccal mucosa, and tongue normal  NECK: supple, thyroid normal size, non-tender, without nodularity LYMPH:  no palpable lymphadenopathy in the cervical, axillary or inguinal. Previously palpable lymphadenopathy has completely resolved LUNGS: clear to auscultation and percussion with normal breathing effort HEART: regular rate & rhythm and no murmurs and no lower extremity edema ABDOMEN:abdomen soft, non-tender and normal bowel sounds Musculoskeletal:no cyanosis of digits and no clubbing  NEURO: alert & oriented x 3 with fluent speech, no focal motor/sensory deficits  LABORATORY DATA:  I have reviewed the data as listed    Component Value Date/Time   NA 137 06/01/2014 0811   NA 138 05/22/2014 0445   K 4.1 06/01/2014 0811   K 3.0* 05/22/2014 0445   CL 101 05/22/2014 0445   CO2 21* 06/01/2014 0811   CO2 26 05/22/2014 0445   GLUCOSE 259* 06/01/2014 0811   GLUCOSE 144* 05/22/2014 0445   BUN 14.4 06/01/2014 0811   BUN 5* 05/22/2014 0445   CREATININE 0.8 06/01/2014 0811   CREATININE 0.66 05/22/2014 0445   CALCIUM 9.4 06/01/2014 0811   CALCIUM 8.1* 05/22/2014 0445   PROT 7.0 06/01/2014 0811   PROT 5.9* 05/20/2014 0600   ALBUMIN 3.6 06/01/2014 0811   ALBUMIN 2.5*  05/20/2014 0600   AST 13 06/01/2014 0811   AST 10 05/20/2014 0600   ALT 25 06/01/2014 0811   ALT 17 05/20/2014 0600   ALKPHOS 96 06/01/2014 0811   ALKPHOS 67 05/20/2014 0600   BILITOT 0.27 06/01/2014 0811   BILITOT 0.3 05/20/2014 0600   GFRNONAA >90 05/22/2014 0445   GFRAA >90 05/22/2014 0445    No results found for: SPEP, UPEP  Lab Results  Component Value Date   WBC 13.0* 06/01/2014   NEUTROABS 11.6* 06/01/2014   HGB 10.0* 06/01/2014   HCT 31.0* 06/01/2014   MCV 92.6 06/01/2014   PLT 329 06/01/2014      Chemistry      Component Value Date/Time   NA 137 06/01/2014 0811   NA 138 05/22/2014 0445   K 4.1 06/01/2014 0811   K 3.0* 05/22/2014 0445   CL 101 05/22/2014 0445   CO2 21* 06/01/2014 0811   CO2 26 05/22/2014 0445   BUN 14.4 06/01/2014 0811   BUN 5* 05/22/2014 0445   CREATININE 0.8 06/01/2014 0811   CREATININE 0.66 05/22/2014 0445  Component Value Date/Time   CALCIUM 9.4 06/01/2014 0811   CALCIUM 8.1* 05/22/2014 0445   ALKPHOS 96 06/01/2014 0811   ALKPHOS 67 05/20/2014 0600   AST 13 06/01/2014 0811   AST 10 05/20/2014 0600   ALT 25 06/01/2014 0811   ALT 17 05/20/2014 0600   BILITOT 0.27 06/01/2014 0811   BILITOT 0.3 05/20/2014 0600     ASSESSMENT & PLAN:  Cancer of base of tongue Overall, she has fully recovered from prior side effects of treatment. We will proceed with cycle 2 of therapy today with minor dose adjustment of 5-FU infusion. I will continue to monitor the patient on the weekly basis for supportive care.  Anemia in neoplastic disease This is likely due to recent treatment. The patient denies recent history of bleeding such as epistaxis, hematuria or hematochezia. She is asymptomatic from the anemia. I will observe for now.  She does not require transfusion now. I will continue the chemotherapy at mild 5-FU dosage adjustment.  If the anemia gets progressive worse in the future, I might have to delay her treatment or adjust the chemotherapy  dose.    Orders Placed This Encounter  Procedures  . Hold Tube, Blood Bank    Standing Status: Standing     Number of Occurrences: 3     Standing Expiration Date: 06/02/2015   All questions were answered. The patient knows to call the clinic with any problems, questions or concerns. No barriers to learning was detected. I spent 30 minutes counseling the patient face to face. The total time spent in the appointment was 40 minutes and more than 50% was on counseling and review of test results     [facility], [place], MD 06/03/2014 8:03 PM

## 2014-06-03 NOTE — Progress Notes (Signed)
To provide support and encouragement, care continuity and to assess for needs, met with patient and her sister during est pt appt with Dr. Alvy Bimler.  In response to patient's comment that she has intermittent foot swelling, I encouraged her to raise her feet when sitting.  She acknowledged she does so.  She denied any needs or concerns; I encouraged her to contact me if that changes before I see her next, she verbalized understanding.  [name], RN, BSN, [place] [facility] [phone]

## 2014-06-06 ENCOUNTER — Ambulatory Visit (HOSPITAL_BASED_OUTPATIENT_CLINIC_OR_DEPARTMENT_OTHER): Payer: BC Managed Care – PPO

## 2014-06-06 ENCOUNTER — Ambulatory Visit: Payer: BC Managed Care – PPO | Attending: Radiation Oncology

## 2014-06-06 ENCOUNTER — Encounter: Payer: Self-pay | Admitting: *Deleted

## 2014-06-06 DIAGNOSIS — R1310 Dysphagia, unspecified: Secondary | ICD-10-CM

## 2014-06-06 DIAGNOSIS — Z5189 Encounter for other specified aftercare: Secondary | ICD-10-CM | POA: Diagnosis not present

## 2014-06-06 DIAGNOSIS — Z452 Encounter for adjustment and management of vascular access device: Secondary | ICD-10-CM

## 2014-06-06 DIAGNOSIS — C01 Malignant neoplasm of base of tongue: Secondary | ICD-10-CM

## 2014-06-06 DIAGNOSIS — Z95828 Presence of other vascular implants and grafts: Secondary | ICD-10-CM

## 2014-06-06 DIAGNOSIS — M436 Torticollis: Secondary | ICD-10-CM | POA: Diagnosis not present

## 2014-06-06 MED ORDER — HEPARIN SOD (PORK) LOCK FLUSH 100 UNIT/ML IV SOLN
500.0000 [IU] | Freq: Once | INTRAVENOUS | Status: AC
  Administered 2014-06-06: 250 [IU] via INTRAVENOUS
  Filled 2014-06-06: qty 5

## 2014-06-06 MED ORDER — SODIUM CHLORIDE 0.9 % IJ SOLN
10.0000 mL | INTRAMUSCULAR | Status: DC | PRN
  Administered 2014-06-06: 10 mL
  Filled 2014-06-06: qty 10

## 2014-06-06 MED ORDER — HEPARIN SOD (PORK) LOCK FLUSH 100 UNIT/ML IV SOLN
500.0000 [IU] | Freq: Once | INTRAVENOUS | Status: AC | PRN
  Filled 2014-06-06: qty 5

## 2014-06-06 MED ORDER — SODIUM CHLORIDE 0.9 % IJ SOLN
10.0000 mL | INTRAMUSCULAR | Status: DC | PRN
  Filled 2014-06-06: qty 10

## 2014-06-06 NOTE — Patient Instructions (Signed)
SWALLOWING EXERCISES Do these 6 of the 7 days per week until 6 weeks after your last day of radiation, then 3 times per week afterwards  1. Effortful Swallows - Squeeze hard with the muscles in your neck while you swallow your  saliva or a sip of water - Repeat 20 times, 2-3 times a day, and use whenever you eat or drink  2. Masako Swallow - swallow with your tongue sticking out - Stick tongue out past your teeth and gently bite tongue with your teeth - Swallow, while holding your tongue with your teeth - Repeat 20 times, 2-3 times a day *use a wet spoon if your mouth gets dry*  3. Pitch Raise - Repeat "he", once per second in as high of a pitch as you can - Repeat 20 times, 2-3 times a day  4. Shaker Exercise - head lift - Lie flat on your back in your bed or on a couch without pillows - Raise your head and look at your feet - KEEP YOUR SHOULDERS DOWN - HOLD FOR 45-60 SECONDS, then lower your head back down - Repeat 3 times, 2-3 times a day  5. Mendelsohn Maneuver - "half swallow" exercise - Start to swallow, and keep your Adam's apple up by squeezing hard with the muscles of the throat - Hold the squeeze for 5-7 seconds and then relax - Repeat 20 times, 2-3 times a day *use a wet spoon if your mouth gets dry*  6. Tongue Press - Press your entire tongue as hard as you can against the roof of your mouth for 3-5 seconds - Repeat 20 times, 2-3 times a day  7. Breath Hold - Say "HUH!" loudly, then hold your breath for 3 seconds at your voice box - Repeat 20 times, 2-3 times a day  8. Chin pushback - Open your mouth  - Place your fist UNDER your chin near your neck, and push back with your fist for 5 seconds - Repeat 10 times, 2-3 times a day  =================================================    XEROSTOMIA (Zeer-uh-stoh-mee-uh)   Xerostomia is the medical term for dry mouth.  Xerostomia results from temporary blockage of or damage to the major salivary glands of the  mouth.  Head and neck cancer patients who undergo chemotherapy and/or radiation therapy may experience changes in saliva production due to irradiation of the salivary glands.  Patient's with this condition experience a chronic dry mouth that may lead to discomfort, difficulty speaking, difficulty swallowing or other health problems.  Symptoms of xerostomia may include: "Sticky" or thick saliva    Burning sensation in the mouth  Cracked or painful tongue   Reduced, "papery" or "peppery" taste Dry/cracked lips    Difficulty eating dry foods Sores in mouth     Feeling of food getting "stuck in throat  Affects from Chemotherapy/Radiation Therapy: Xerostomia resulting from chemotherapy is usually temporary.  Patients normally are able to maintain adequate saliva production approximately 2-8 weeks post therapy.  Xerostomia resulting from radiation therapy can have lasting effects on saliva production.  Initially, the cause is inflammation to the saliva glands from exposure to the radiation therapy.  Longer term problems depend on the level of dosage and the length of treatment to the saliva glands.  Recovery may be experienced up to 1-year post radiation therapy.  Other common causes of xerostomia: Over-the-counter medications   Smoking Prescription medications    Alcohol Health Conditions (Parkinson's Disease,  Caffeine Sjogren's Syndrome)  Helpful hints for relief of xerostomia:  Maintain good oral care - brush and rinse several times daily, particularly after meals Use special mouth care products  Eat moistly cooked foods Chew gum or suck on hard candy (sugarless) Use prescription medications (Salagen, Evoxac) if prescribed by your physician Carry water or sip on water during the day to help maintain moistness Use a humidifier in your home to maintain a moist environment, especially at night Alternate liquid with your food while eating Have regular dental check-ups Alert your doctor if you  have any sores or pain in your mouth   Additional information concerning xerostomia and its effects may be found at: https://www.vasquez-morse.com/; http://reid-chang.com/; https://www.kelley.org/.    =================================================      Radiation Therapy Side Effects: Esophagitis and Mucositis   Will Radiation therapy make my mouth or throat hurt? The lining of your esophagus (food pipe) is sensitive to radiation and may become inflamed and sore during treatments, a condition called esophagitis.  You may feel a burning sensation in your throat or chest or you may feel as if you have a "lump" in your throat.  You may also feel pain when swallowing.  The lining of your mouth, throat, and gums is called the oral mucosa.  This lining is sensitive to radiation and may become inflamed or sore during treatments, a condition called Mucositis.  You may have a dry mouth with thick, sticky saliva.  You may also have mouth sores or discomfort when chewing or swallowing.  Some patients receiving radiation treatments to the mouth may be referred to a dentist, but most patients will be referred to a registered dietitian.   The symptoms of esophagitis and Mucositis may occur during the second or third week of radiation therapy.  The symptoms are common and temporary - they will subside gradually within two to three weeks of completing treatment.   What should I do if I have a sore mouth or feel pain when swallowing? To reduce the discomfort caused by esophagitis or mucositis, follow these guidelines: . Sit upright at a 90-degree angle and lean your head slightly forward. . Eat slowly.  Cut your food into small pieces and chew it completely. . Eat small, frequent meals throughout the day instead of three large meals. . Eat foods that are warm or are at room temperature.  Avoid hot foods and drinks.  Also avoid crunchy foods, such as potato chips  and nuts. . Eat soft foods.  Puree or finely chop cooked meats, fruits, and vegetables.  You may want to try commercial baby foods, which are nutritious, convenient, and easy to swallow.  High-protein milkshakes are also nutritious and easy to swallow. . Drink liquids through a straw to make swallowing easier. . If you are experiencing a burning sensation when eating or drinking, try taking an antacid before your meals. . If swallowing is painful, try rinsing your mouth with a local anesthetic or taking a pain medication prescribed by your health care provider before you eat. . Do not talk with food in your mouth. . Remain sitting or standing upright for 15 to 20 minutes after eating a meal. . Avoid eating spicy foods. . Avoid eating or drinking acidic foods and beverages such as tomatoes, oranges, grapefruits and their juices.  Instead, try nectars and imitation fruit drinks with vitamin C. . Avoid alcohol and tobacco.   What should I do if I am having difficulty swallowing my medications? If you are having difficulty swallowing your medications, ask your pharmacist which medications can be  crushed.  If your pharmacist tells you it is safe to crush your medications, mix them with soft foods such as applesauce or pudding.  It is important to ask your pharmacist for his/her recommendations on which pills should NOT be crushed.   How can I relieve the discomfort of a dry mouth? . Rinse your mouth with water before meals. Sarina Ser your food completely.  Sip liquids frequently while eating to keep food moist and to help with swallowing. . Drink six to eight 8-ounce glasses of fluid per day.  You may want to carry a small bottle of water with you. Sarina Ser sugarless gum or suck on hard candies or mints, preferably sugarless. Desma Paganini with club soda or drink fluids with lemon or lime to help "cut" think saliva. . Run a cold air humidifier in your main living area during the day and in your bedroom at  night.  If you do not own a humidifier or vaporizer, boil a pan of water and carefully inhale the steam. . Ask you healthcare provider about artificial saliva.  This may be prescribed if your mouth dryness is severe. . Avoid tobacco and alcohol.   What are some oral hygiene tips? Good mouth care may not prevent side effects to your mouth, but it helps prevent infection and the spread of infection.  Good mouth care also helps reduce the risk of further reaction on tissue.  Lastly, good oral hygiene prevents bad breath, leaves a fresh feeling in your mouth, and improves your appetite.  Here are some tips: . Examine your mouth and gums daily.  Your healthcare provider may recommend that you see your dentist before your radiation treatments begin. . Brush your teeth after each mean with a small, soft toothbrush and fluoride toothpaste.  Use foam sticks instead of a toothbrush if gums are especially sore.  Keep dentures clean and fitting properly. . Use dental floss daily . Avoid commercial mouthwashes or lozenges that contain a high concentration of alcohol - they may irritate or dry your mouth. . Prepare a gargle by dissolving one teaspoon each of salt and baking soda in a quart of warm water.  A smaller portion of this can be made by stirring  teaspoon each of sale and baking soda into eight ounces of warm water.  Rinse your mouth and gargle with this solution at least four to six times a day, especially after meals and before going to bed.   Will I still be able to wear my dentures if I'm receiving radiation to the mouth? If your gums become inflamed or sore during your radiation treatment, you may have difficulty wearing dentures.  Often, dentures may have to be removed during treatment and refitted three to six months after your treatment is complete.   Will radiation therapy change my sense of taste? You sense of taste may change during radiation treatments.  Different foods may seem to taste  the same, have a slightly bitter taste, or not have any taste at all.  Despite changes in you sense of taste, it is very important to continue eating well-balanced meals to avoid losing weight.  Foods that are slightly chilled (flavored gelatin, pudding, and applesauce) may be tolerated better.  Meat commonly becomes distasteful after several weeks of treatment.  If you are unable to eat meat because it is distasteful, be sure that you have another protein source in your diet.  Try eating more fish, poultry, eggs, cheese, and milk.  Adding  protein supplements to your meal plan usually becomes necessary when your sense of taste changes.  A registered dietitian can recommend a supplement brand to meet your nutritional needs.     Is there anyone available to address my individual nutritional concerns?  Yes.  Registered dietitians can help you with any nutritional concerns you may have.  Dietitians are available to help you modify your diet and advise you on recipes and nutritional supplements to provide additional calories, protein and other nutrients.    Copyright 1995-2010 [facility].  All rights reserved.  ======================================================================    Late Effects of Head and Neck Radiation  Late effects from radiation to the head and neck may be complicated, and may involve several different structures in the regions that were irradiated.  The most common late effect of radiotherapy for head and neck cancer is dry mouth.  This can occur from damage to the glands that produce saliva.  Dry mouth can cause difficulty with swallowing or eating certain types of food.  Additionally, chronic dry mouth can cause dental decay that may occur faster than usual.  Survivors of head and neck cancer should be followed closely be a dentist, and the dentist may recommend use of fluoride trays to promote dental health.  Artificial saliva products may be helpful  in relieving dry mouth, as well as taking small, frequent sips of water.  Radiation to the head and neck may alter the taste sensation, as well.  In many cases, taste returns to normal with time, but this is not always the case.  Difficulties such as dry mouth and taste alteration may affect a survivor's ability to take in adequate food.  Use of nutritional supplements, such as Ensure or Boost, may be helpful, and nutritional counseling is important for all survivors who may have difficulty eating.  Radiation to the neck may also cause damage to the vessels, nerves, and muscles.  Damage to the vessels in the neck may cause increased risk of stroke from atherosclerosis (plaqueing) inside the vessels.  Because of this risk, survivors of head and neck cancer should be particularly careful to avoid other risk factors for stroke.  To decrease the risk of stroke eat a low-fat, healthy diet and avoid smoking.  Damage to the lymph vessels, which drain fluid from the head and neck, may cause lymphedema.  Lymphedema may cause swelling in the neck or face, which may be worse after lying down.  Patients with lymphedema may be referred to a Lymphedema Therapist for therapy to address the problems associated with lymphedema.  Damage to the nerves within the head and/or neck is rare after radiation, but does occur in some cases.  Damage to the nerves that control the throat may make swallowing difficult.  If this occurs, a swallowing study may be performed, called a modified barium swallow or MBSS.  If a speech-language pathologist oberves swallowing abnormality on a swallowing study, s/he may recommend dietary changes to assist with swallowing and to prevent choking.  Damage to the nerves that control the voice may cause hoarse voice, which may or may not be permanent.  Damage to the nerves that control the arms can also occur from radiation to the head and neck.  This may occur from damage to a nerve structure called the  brachial plexus, and may result in weakness of one arm or shoulder.  Patients who develop arm weakness should be referred to an Occupational or Physical Therapist; they may also be referred  to a Neurologist for testing of nerve and muscle function.  Very rarely, the nerves that control the tongue may be damaged by radiation.  This may cause uncomfortable "spasms" of the tongue.  Patients who develop painful motions or spasms of the tongue should be evaluated by a speech-language pathologist and may also be referred to a neurologist for testing of nerve and muscle function.  Damage to the muscles in the neck may also occur after radiation.  Usually, damage to the muscles results in atrophy, or decrease in size of the muscles on one or both sides of the neck.  The muscles and soft tissue may also develop a hard or "woody" texture.  Some survivors will develop longer, thinner-appearing necks that are not as strong as they were before treatment due to muscle atrophy, resulting in difficulty holding their heads up for long periods.  Some practitioners will prescribe a brace or physical therapy to help with this condition.  Some practitioners may recommend a combination of medicines to address hardening, or fibrosis, of tissues in the head and neck, including pentoxifylline (Trental) and vitamin E.  Survivors interested in medical treatment for changes in the tissues of the neck may discuss these medicines with an oncology specialist with expertise in treating head and neck cancers.       Information adapted from [place].

## 2014-06-06 NOTE — Therapy (Signed)
[facility] [address] [place], [place], [address] Phone: [phone]   Fax:  [phone]  Speech Language Pathology Evaluation  Patient Details  Name: [name] MRN: [id number] Date of Birth: Jul 25, 1955  Encounter Date: 06/06/2014      End of Session - 06/06/14 1524    Visit Number 1   Number of Visits 6   Date for SLP Re-Evaluation 12/06/14   SLP Start Time 1404   SLP Stop Time  1455   SLP Time Calculation (min) 51 min   Activity Tolerance Patient tolerated treatment well      Past Medical History  Diagnosis Date  . Hypertension   . Diabetes mellitus without complication     "borderline diabetic"   . Hypercholesteremia   . Diabetes mellitus type 2, controlled, without complications 58/02/9832  . Cancer of tongue   . Anemia in neoplastic disease 06/01/2014    Past Surgical History  Procedure Laterality Date  . Cervical ablation      Uterus  . Nasal fracture surgery  8250    Dr. Erik Obey    There were no vitals taken for this visit.  Visit Diagnosis: Dysphagia - Plan: SLP plan of care cert/re-cert      Subjective Assessment - 06/06/14 1410    Currently in Pain? No/denies    Pt does not report current difficulty with swallowing during meals or with drinks apart from meals. Prior to chemo, pt with difficulty with meds. Now, no difficulty.    O: Pt beginning rad tx likely early 2016. Pt currently tolerates regular diet and thin liquids. Oral motor assessment revealed WNL lingual ROM and WNL lingual strength. Labial ROM was WNL and strength was WNL. Velar ROM appeared WNL. POs: Pt was assessed with cereal bar and H2O without overt s/s aspiration. Thyroid elevation appeared WNL, and swallows appeared timely. Oral residue noted as WNL. Pt's swallow deemed WNL at this time.   Because data states the risk for dysphagia during and after radiation treatment is high due to undergoing radiation tx, SLP taught pt about  the possibility of reduced/limited ability for PO intake during rad tx. SLP encouraged pt to continue swallowing POs as far into rad tx as possible, even ingesting POs and/or completing HEP shortly after administration of pain meds.   SLP educated pt re: changes to swallowing musculature after rad tx, and why adherence to dysphagia HEP provided today and PO consumption was necessary to inhibit muscular disuse atrophy and to reduce muscle fibrosis following rad tx. Pt demonstrated understanding of these things to SLP. Further education was provided re: xerostomia, mucositis/esophagitis, and late effects head/neck radiation.   After eval tasks, SLP then developed a HEP for pt and pt was instructed how to perform exercises involving lingual, vocal, and pharyngeal strengthening. SLP performed each exercise and pt return demonstrated each exercise. SLP ensured pt performance was correct prior to moving on to next exercise. Pt was instructed to complete this program once a day until rad tx begins, then 2-3 times a day, 6-7 days/week until 60 days after their last rad tx, then x3 a week after that.         SLP Education - 06/06/14 1419    Education provided Yes   Education Details HEP for swallowing, late effects of radiation, xerostomia, mucositis/esophagitis          SLP Short Term Goals - 06/06/14 1530    SLP SHORT TERM GOAL #1   Title pt will complete  HEP with rare modified independence   Time 2   Period Months   Status New   SLP SHORT TERM GOAL #2   Title pt will tell SLP why she is completing HEP   Time 2   Period Months   Status New     SLP LONG TERM GOAL #1 Pt will tell SLP why food journal can be helpful for return to full PO diet following head/neck rad tx Period: 4 months Status: New  SLP LONG TERM GOAL #2 Pt will complete HEP with modified independnecne over 4 sessions in order to maximize opportunity for WNL swallowing over time Period: 4 months Status: New  SLP LONG  TERM GOAL #3 Pt will tell SLP 3 s/s aspiration PNA t ominimize pt risk for pulmonary complications following head/neck rad tx Period: 4 months Status: New     Problem List Patient Active Problem List   Diagnosis Date Noted  . Anemia in neoplastic disease 06/01/2014  . Diarrhea 05/23/2014  . Hypertension 05/19/2014  . S/P PICC central line placement 05/16/2014  . Cancer of base of tongue 05/09/2014  . Diabetes mellitus type 2, controlled, without complications 02/33/4356     Garald Balding, [place], [place]  06/06/2014 3:43 PM  Phone: [phone] Fax: [phone]

## 2014-06-07 ENCOUNTER — Telehealth: Payer: Self-pay | Admitting: *Deleted

## 2014-06-07 NOTE — Telephone Encounter (Signed)
Call from [place].  They need pt's ICD-10 code to process pt's insurance.  Informed of code is C01.

## 2014-06-08 ENCOUNTER — Ambulatory Visit (HOSPITAL_BASED_OUTPATIENT_CLINIC_OR_DEPARTMENT_OTHER): Payer: BC Managed Care – PPO

## 2014-06-08 ENCOUNTER — Other Ambulatory Visit (HOSPITAL_BASED_OUTPATIENT_CLINIC_OR_DEPARTMENT_OTHER): Payer: BC Managed Care – PPO

## 2014-06-08 ENCOUNTER — Encounter: Payer: Self-pay | Admitting: Hematology and Oncology

## 2014-06-08 ENCOUNTER — Ambulatory Visit (HOSPITAL_BASED_OUTPATIENT_CLINIC_OR_DEPARTMENT_OTHER): Payer: BC Managed Care – PPO | Admitting: Hematology and Oncology

## 2014-06-08 VITALS — BP 130/62 | HR 101 | Temp 98.0°F | Resp 20 | Ht 67.0 in | Wt 194.9 lb

## 2014-06-08 DIAGNOSIS — K1231 Oral mucositis (ulcerative) due to antineoplastic therapy: Secondary | ICD-10-CM

## 2014-06-08 DIAGNOSIS — C01 Malignant neoplasm of base of tongue: Secondary | ICD-10-CM

## 2014-06-08 DIAGNOSIS — D630 Anemia in neoplastic disease: Secondary | ICD-10-CM

## 2014-06-08 DIAGNOSIS — K2100 Gastro-esophageal reflux disease with esophagitis, without bleeding: Secondary | ICD-10-CM

## 2014-06-08 DIAGNOSIS — Z95828 Presence of other vascular implants and grafts: Secondary | ICD-10-CM

## 2014-06-08 DIAGNOSIS — Z5189 Encounter for other specified aftercare: Secondary | ICD-10-CM

## 2014-06-08 DIAGNOSIS — K219 Gastro-esophageal reflux disease without esophagitis: Secondary | ICD-10-CM

## 2014-06-08 DIAGNOSIS — T451X5A Adverse effect of antineoplastic and immunosuppressive drugs, initial encounter: Secondary | ICD-10-CM

## 2014-06-08 DIAGNOSIS — D701 Agranulocytosis secondary to cancer chemotherapy: Secondary | ICD-10-CM | POA: Insufficient documentation

## 2014-06-08 DIAGNOSIS — D72819 Decreased white blood cell count, unspecified: Secondary | ICD-10-CM

## 2014-06-08 HISTORY — DX: Gastro-esophageal reflux disease without esophagitis: K21.9

## 2014-06-08 LAB — COMPREHENSIVE METABOLIC PANEL (CC13)
ALBUMIN: 3.6 g/dL (ref 3.5–5.0)
ALT: 44 U/L (ref 0–55)
AST: 23 U/L (ref 5–34)
Alkaline Phosphatase: 90 U/L (ref 40–150)
Anion Gap: 10 mEq/L (ref 3–11)
BUN: 13.3 mg/dL (ref 7.0–26.0)
CALCIUM: 9.4 mg/dL (ref 8.4–10.4)
CHLORIDE: 101 meq/L (ref 98–109)
CO2: 27 mEq/L (ref 22–29)
Creatinine: 0.8 mg/dL (ref 0.6–1.1)
EGFR: 84 mL/min/{1.73_m2} — ABNORMAL LOW (ref >90)
Glucose: 158 mg/dl — ABNORMAL HIGH (ref 70–140)
POTASSIUM: 3.7 meq/L (ref 3.5–5.1)
Sodium: 137 mEq/L (ref 136–145)
Total Bilirubin: 0.32 mg/dL (ref 0.20–1.20)
Total Protein: 6.5 g/dL (ref 6.4–8.3)

## 2014-06-08 LAB — CBC WITH DIFFERENTIAL/PLATELET
BASO%: 3.6 % — ABNORMAL HIGH (ref 0.0–2.0)
Basophils Absolute: 0.1 10*3/uL (ref 0.0–0.1)
EOS%: 6.3 % (ref 0.0–7.0)
Eosinophils Absolute: 0.1 10*3/uL (ref 0.0–0.5)
HEMATOCRIT: 30 % — AB (ref 34.8–46.6)
HGB: 9.7 g/dL — ABNORMAL LOW (ref 11.6–15.9)
LYMPH#: 1.2 10*3/uL (ref 0.9–3.3)
LYMPH%: 66.2 % — ABNORMAL HIGH (ref 14.0–49.7)
MCH: 29.7 pg (ref 25.1–34.0)
MCHC: 32.2 g/dL (ref 31.5–36.0)
MCV: 92.3 fL (ref 79.5–101.0)
MONO#: 0.1 10*3/uL (ref 0.1–0.9)
MONO%: 3 % (ref 0.0–14.0)
NEUT%: 20.9 % — AB (ref 38.4–76.8)
NEUTROS ABS: 0.4 10*3/uL — AB (ref 1.5–6.5)
Platelets: 209 10*3/uL (ref 145–400)
RBC: 3.26 10*6/uL — AB (ref 3.70–5.45)
RDW: 14.8 % — ABNORMAL HIGH (ref 11.2–14.5)
WBC: 1.9 10*3/uL — AB (ref 3.9–10.3)

## 2014-06-08 LAB — HOLD TUBE, BLOOD BANK

## 2014-06-08 MED ORDER — SODIUM CHLORIDE 0.9 % IJ SOLN
10.0000 mL | INTRAMUSCULAR | Status: DC | PRN
  Administered 2014-06-08: 10 mL via INTRAVENOUS
  Filled 2014-06-08: qty 10

## 2014-06-08 MED ORDER — PANTOPRAZOLE SODIUM 40 MG PO TBEC: 40.0000 mg | DELAYED_RELEASE_TABLET | Freq: Every day | ORAL | Status: DC

## 2014-06-08 MED ORDER — PEGFILGRASTIM INJECTION 6 MG/0.6ML ~~LOC~~
6.0000 mg | PREFILLED_SYRINGE | Freq: Once | SUBCUTANEOUS | Status: AC
  Administered 2014-06-08: 6 mg via SUBCUTANEOUS
  Filled 2014-06-08: qty 0.6

## 2014-06-08 MED ORDER — MAGIC MOUTHWASH: 5.0000 mL | Freq: Four times a day (QID) | ORAL | Status: DC | PRN

## 2014-06-08 MED ORDER — HEPARIN SOD (PORK) LOCK FLUSH 100 UNIT/ML IV SOLN
500.0000 [IU] | Freq: Once | INTRAVENOUS | Status: AC
  Administered 2014-06-08: 250 [IU] via INTRAVENOUS
  Filled 2014-06-08: qty 5

## 2014-06-08 NOTE — Assessment & Plan Note (Signed)
This is likely due to recent treatment. The patient denies recent history of fevers, cough, chills, diarrhea or dysuria. She is asymptomatic from the leukopenia. I will observe for now. Recommend neutropenic precaution

## 2014-06-08 NOTE — Assessment & Plan Note (Signed)
She tolerated reduced dose cycle 2 well. Continue supportive care

## 2014-06-08 NOTE — Patient Instructions (Signed)
Pegfilgrastim injection What is this medicine? PEGFILGRASTIM (peg fil GRA stim) is a long-acting granulocyte colony-stimulating factor that stimulates the growth of neutrophils, a type of white blood cell important in the body's fight against infection. It is used to reduce the incidence of fever and infection in patients with certain types of cancer who are receiving chemotherapy that affects the bone marrow. This medicine may be used for other purposes; ask your health care provider or pharmacist if you have questions. COMMON BRAND NAME(S): Neulasta What should I tell my health care provider before I take this medicine? They need to know if you have any of these conditions: -latex allergy -ongoing radiation therapy -sickle cell disease -skin reactions to acrylic adhesives (On-Body Injector only) -an unusual or allergic reaction to pegfilgrastim, filgrastim, other medicines, foods, dyes, or preservatives -pregnant or trying to get pregnant -breast-feeding How should I use this medicine? This medicine is for injection under the skin. If you get this medicine at home, you will be taught how to prepare and give the pre-filled syringe or how to use the On-body Injector. Refer to the patient Instructions for Use for detailed instructions. Use exactly as directed. Take your medicine at regular intervals. Do not take your medicine more often than directed. It is important that you put your used needles and syringes in a special sharps container. Do not put them in a trash can. If you do not have a sharps container, call your pharmacist or healthcare provider to get one. Talk to your pediatrician regarding the use of this medicine in children. Special care may be needed. Overdosage: If you think you have taken too much of this medicine contact a poison control center or emergency room at once. NOTE: This medicine is only for you. Do not share this medicine with others. What if I miss a dose? It is  important not to miss your dose. Call your doctor or health care professional if you miss your dose. If you miss a dose due to an On-body Injector failure or leakage, a new dose should be administered as soon as possible using a single prefilled syringe for manual use. What may interact with this medicine? Interactions have not been studied. Give your health care provider a list of all the medicines, herbs, non-prescription drugs, or dietary supplements you use. Also tell them if you smoke, drink alcohol, or use illegal drugs. Some items may interact with your medicine. This list may not describe all possible interactions. Give your health care provider a list of all the medicines, herbs, non-prescription drugs, or dietary supplements you use. Also tell them if you smoke, drink alcohol, or use illegal drugs. Some items may interact with your medicine. What should I watch for while using this medicine? You may need blood work done while you are taking this medicine. If you are going to need a MRI, CT scan, or other procedure, tell your doctor that you are using this medicine (On-Body Injector only). What side effects may I notice from receiving this medicine? Side effects that you should report to your doctor or health care professional as soon as possible: -allergic reactions like skin rash, itching or hives, swelling of the face, lips, or tongue -dizziness -fever -pain, redness, or irritation at site where injected -pinpoint red spots on the skin -shortness of breath or breathing problems -stomach or side pain, or pain at the shoulder -swelling -tiredness -trouble passing urine Side effects that usually do not require medical attention (report to your doctor   or health care professional if they continue or are bothersome): -bone pain -muscle pain This list may not describe all possible side effects. Call your doctor for medical advice about side effects. You may report side effects to FDA at  1-800-FDA-1088. Where should I keep my medicine? Keep out of the reach of children. Store pre-filled syringes in a refrigerator between 2 and 8 degrees C (36 and 46 degrees F). Do not freeze. Keep in carton to protect from light. Throw away this medicine if it is left out of the refrigerator for more than 48 hours. Throw away any unused medicine after the expiration date. NOTE: This sheet is a summary. It may not cover all possible information. If you have questions about this medicine, talk to your doctor, pharmacist, or health care provider.  2015, Elsevier/Gold Standard. (2013-09-07 16:14:05)  

## 2014-06-08 NOTE — Assessment & Plan Note (Signed)
This is due to treatment. Recommend Biotene and Magic mouth wash. 

## 2014-06-08 NOTE — Assessment & Plan Note (Signed)
This is likely due to recent treatment. The patient denies recent history of bleeding such as epistaxis, hematuria or hematochezia. She is asymptomatic from the anemia. I will observe for now.   

## 2014-06-08 NOTE — Patient Instructions (Signed)
PICC Home Guide A peripherally inserted central catheter (PICC) is a long, thin, flexible tube that is inserted into a vein in the upper arm. It is a form of intravenous (IV) access. It is considered to be a "central" line because the tip of the PICC ends in a large vein in your chest. This large vein is called the superior vena cava (SVC). The PICC tip ends in the SVC because there is a lot of blood flow in the SVC. This allows medicines and IV fluids to be quickly distributed throughout the body. The PICC is inserted using a sterile technique by a specially trained nurse or physician. After the PICC is inserted, a chest X-ray exam is done to be sure it is in the correct place.  A PICC may be placed for different reasons, such as:  To give medicines and liquid nutrition that can only be given through a central line. Examples are:  Certain antibiotic treatments.  Chemotherapy.  Total parenteral nutrition (TPN).  To take frequent blood samples.  To give IV fluids and blood products.  If there is difficulty placing a peripheral intravenous (PIV) catheter. If taken care of properly, a PICC can remain in place for several months. A PICC can also allow a person to go home from the hospital early. Medicine and PICC care can be managed at home by a family member or home health care team. WHAT PROBLEMS CAN HAPPEN WHEN I HAVE A PICC? Problems with a PICC can occasionally occur. These may include the following:  A blood clot (thrombus) forming in or at the tip of the PICC. This can cause the PICC to become clogged. A clot-dissolving medicine called tissue plasminogen activator (tPA) can be given through the PICC to help break up the clot.  Inflammation of the vein (phlebitis) in which the PICC is placed. Signs of inflammation may include redness, pain at the insertion site, red streaks, or being able to feel a "cord" in the vein where the PICC is located.  Infection in the PICC or at the insertion  site. Signs of infection may include fever, chills, redness, swelling, or pus drainage from the PICC insertion site.  PICC movement (malposition). The PICC tip may move from its original position due to excessive physical activity, forceful coughing, sneezing, or vomiting.  A break or cut in the PICC. It is important to not use scissors near the PICC.  Nerve or tendon irritation or injury during PICC insertion. WHAT SHOULD I KEEP IN MIND ABOUT ACTIVITIES WHEN I HAVE A PICC?  You may bend your arm and move it freely. If your PICC is near or at the bend of your elbow, avoid activity with repeated motion at the elbow.  Rest at home for the remainder of the day following PICC line insertion.  Avoid lifting heavy objects as instructed by your health care provider.  Avoid using a crutch with the arm on the same side as your PICC. You may need to use a walker. WHAT SHOULD I KNOW ABOUT MY PICC DRESSING?  Keep your PICC bandage (dressing) clean and dry to prevent infection.  Ask your health care provider when you may shower. Ask your health care provider to teach you how to wrap the PICC when you do take a shower.  Change the PICC dressing as instructed by your health care provider.  Change your PICC dressing if it becomes loose or wet. WHAT SHOULD I KNOW ABOUT PICC CARE?  Check the PICC insertion site   daily for leakage, redness, swelling, or pain.  Do not take a bath, swim, or use hot tubs when you have a PICC. Cover PICC line with clear plastic wrap and tape to keep it dry while showering.  Flush the PICC as directed by your health care provider. Let your health care provider know right away if the PICC is difficult to flush or does not flush. Do not use force to flush the PICC.  Do not use a syringe that is less than 10 mL to flush the PICC.  Never pull or tug on the PICC.  Avoid blood pressure checks on the arm with the PICC.  Keep your PICC identification card with you at all  times.  Do not take the PICC out yourself. Only a trained clinical professional should remove the PICC. SEEK IMMEDIATE MEDICAL CARE IF:  Your PICC is accidentally pulled all the way out. If this happens, cover the insertion site with a bandage or gauze dressing. Do not throw the PICC away. Your health care provider will need to inspect it.  Your PICC was tugged or pulled and has partially come out. Do not  push the PICC back in.  There is any type of drainage, redness, or swelling where the PICC enters the skin.  You cannot flush the PICC, it is difficult to flush, or the PICC leaks around the insertion site when it is flushed.  You hear a "flushing" sound when the PICC is flushed.  You have pain, discomfort, or numbness in your arm, shoulder, or jaw on the same side as the PICC.  You feel your heart "racing" or skipping beats.  You notice a hole or tear in the PICC.  You develop chills or a fever. MAKE SURE YOU:   Understand these instructions.  Will watch your condition.  Will get help right away if you are not doing well or get worse. Document Released: 12/13/2002 Document Revised: 10/23/2013 Document Reviewed: 02/13/2013 Exit[employer] Patient Information 2015 [employer]. This information is not intended to replace advice given to you by your health care provider. Make sure you discuss any questions you have with your health care provider.  

## 2014-06-08 NOTE — Progress Notes (Signed)
[facility] OFFICE PROGRESS NOTE  Patient Care Team: Gaynelle Arabian, MD as PCP - General (Family Medicine) Brooks Sailors, RN as Oncology Nurse [place], RD as Dietitian (Nutrition) Eppie Gibson, MD as Attending Physician (Radiation Oncology)  SUMMARY OF ONCOLOGIC HISTORY: Oncology History   Cancer of base of tongue   Staging form: Lip and Oral Cavity, AJCC 7th Edition     Clinical: Stage IVA (T3, N2b, M0) - Signed by Heath Lark, MD on 05/09/2014       Cancer of base of tongue   04/20/2014 Imaging CT neck showed advanced base of tongue mass and multiple right neck lymphadenopathy   04/23/2014 Pathology Results [id number] FNA of right neck is positive for squamous cell cancer, P16 positive   05/09/2014 Imaging PET/CT scan confirmed base of tongue cancer along with lymph node metastasis.   05/10/2014 Procedure She has placement of PICC line.   05/11/2014 -  Chemotherapy She received induction chemotherapy with Taxotere, cisplatin and 5-FU.   05/19/2014 - 05/22/2014 Hospital Admission She was admitted to the hospital for management of neutropenic fever, GI bleed requiring blood transfusion and diarrhea from chemotherapy    INTERVAL HISTORY: Please see below for problem oriented charting. She is seen as part of supportive care. She had mild mucositis  REVIEW OF SYSTEMS:   Constitutional: Denies fevers, chills or abnormal weight loss Eyes: Denies blurriness of vision Ears, nose, mouth, throat, and face: Denies sore throat Respiratory: Denies cough, dyspnea or wheezes Cardiovascular: Denies palpitation, chest discomfort or lower extremity swelling Gastrointestinal:  Denies nausea, heartburn or change in bowel habits Skin: Denies abnormal skin rashes Lymphatics: Denies new lymphadenopathy or easy bruising Neurological:Denies numbness, tingling or new weaknesses Behavioral/Psych: Mood is stable, no new changes  All other systems were reviewed with the  patient and are negative.  I have reviewed the past medical history, past surgical history, social history and family history with the patient and they are unchanged from previous note.  ALLERGIES:  is allergic to penicillins.  MEDICATIONS:  Current Outpatient Prescriptions  Medication Sig Dispense Refill  . ALPRAZolam (XANAX) 0.25 MG tablet Take 0.25 mg by mouth as needed for anxiety.    . Alum & Mag Hydroxide-Simeth (MAGIC MOUTHWASH) SOLN Take 5 mLs by mouth 4 (four) times daily as needed for mouth pain. 500 mL 0  . amLODipine (NORVASC) 10 MG tablet Take 10 mg by mouth daily.    Marland Kitchen atorvastatin (LIPITOR) 10 MG tablet Take 10 mg by mouth daily.    Marland Kitchen dexamethasone (DECADRON) 4 MG tablet Start the day before and the day after chemotherapy, 2 times a day every 3 weeks 30 tablet 1  . levofloxacin (LEVAQUIN) 500 MG tablet Take 1 tablet (500 mg total) by mouth daily. 10 tablet 0  . loperamide (IMODIUM) 2 MG capsule Take 2 mg by mouth as needed for diarrhea or loose stools.    Marland Kitchen losartan-hydrochlorothiazide (HYZAAR) 100-12.5 MG per tablet     . metFORMIN (GLUCOPHAGE) 500 MG tablet Take 500 mg by mouth daily with breakfast.    . ondansetron (ZOFRAN) 8 MG tablet Take 1 tablet (8 mg total) by mouth every 8 (eight) hours as needed (Nausea or vomiting). 90 tablet 1  . pantoprazole (PROTONIX) 40 MG tablet Take 1 tablet (40 mg total) by mouth daily. 90 tablet 2  . phosphorus (K PHOS NEUTRAL) 155-852-130 MG tablet Take 1 tablet (250 mg total) by mouth 2 (two) times daily. (Patient not taking: Reported on 06/08/2014)  10 tablet 0  . Prenatal Vit-Fe Fumarate-FA (MULTIVITAMIN-PRENATAL) 27-0.8 MG TABS tablet Take 1 tablet by mouth daily at 12 noon.    Marland Kitchen PRESCRIPTION MEDICATION CHEMO [facility]    . prochlorperazine (COMPAZINE) 10 MG tablet Take 1 tablet (10 mg total) by mouth every 6 (six) hours as needed (Nausea or vomiting). 30 tablet 1  . Sodium Chloride Flush 0.9 % SOLN injection Inject 10 mLs into the vein daily.  60 Syringe 3  . sodium fluoride (FLUORISHIELD) 1.1 % GEL dental gel Instill one drop of gel per tooth space of fluoride tray. Place over teeth for 5 minutes. Remove. Spit out excess. Repeat nightly. 120 mL prn  . UNABLE TO FIND Med Name: wigs 2 each 0   No current facility-administered medications for this visit.    PHYSICAL EXAMINATION: ECOG PERFORMANCE STATUS: 1 - Symptomatic but completely ambulatory  Filed Vitals:   06/08/14 1124  BP: 130/62  Pulse: 101  Temp: 98 F (36.7 C)  Resp: 20   Filed Weights   06/08/14 1124  Weight: 194 lb 14.4 oz (88.406 kg)    GENERAL:alert, no distress and comfortable SKIN: skin color, texture, turgor are normal, no rashes or significant lesions EYES: normal, Conjunctiva are pink and non-injected, sclera clear OROPHARYNX:no exudate, no erythema and lips, buccal mucosa, and tongue normal . No signs of mucositis or thrush NECK: supple, thyroid normal size, non-tender, without nodularity LYMPH:  no palpable lymphadenopathy in the cervical, axillary or inguinal LUNGS: clear to auscultation and percussion with normal breathing effort HEART: regular rate & rhythm and no murmurs and no lower extremity edema ABDOMEN:abdomen soft, non-tender and normal bowel sounds Musculoskeletal:no cyanosis of digits and no clubbing  NEURO: alert & oriented x 3 with fluent speech, no focal motor/sensory deficits  LABORATORY DATA:  I have reviewed the data as listed    Component Value Date/Time   NA 137 06/08/2014 1046   NA 138 05/22/2014 0445   K 3.7 06/08/2014 1046   K 3.0* 05/22/2014 0445   CL 101 05/22/2014 0445   CO2 27 06/08/2014 1046   CO2 26 05/22/2014 0445   GLUCOSE 158* 06/08/2014 1046   GLUCOSE 144* 05/22/2014 0445   BUN 13.3 06/08/2014 1046   BUN 5* 05/22/2014 0445   CREATININE 0.8 06/08/2014 1046   CREATININE 0.66 05/22/2014 0445   CALCIUM 9.4 06/08/2014 1046   CALCIUM 8.1* 05/22/2014 0445   PROT 6.5 06/08/2014 1046   PROT 5.9* 05/20/2014  0600   ALBUMIN 3.6 06/08/2014 1046   ALBUMIN 2.5* 05/20/2014 0600   AST 23 06/08/2014 1046   AST 10 05/20/2014 0600   ALT 44 06/08/2014 1046   ALT 17 05/20/2014 0600   ALKPHOS 90 06/08/2014 1046   ALKPHOS 67 05/20/2014 0600   BILITOT 0.32 06/08/2014 1046   BILITOT 0.3 05/20/2014 0600   GFRNONAA >90 05/22/2014 0445   GFRAA >90 05/22/2014 0445    No results found for: SPEP, UPEP  Lab Results  Component Value Date   WBC 1.9* 06/08/2014   NEUTROABS 0.4* 06/08/2014   HGB 9.7* 06/08/2014   HCT 30.0* 06/08/2014   MCV 92.3 06/08/2014   PLT 209 06/08/2014      Chemistry      Component Value Date/Time   NA 137 06/08/2014 1046   NA 138 05/22/2014 0445   K 3.7 06/08/2014 1046   K 3.0* 05/22/2014 0445   CL 101 05/22/2014 0445   CO2 27 06/08/2014 1046   CO2 26 05/22/2014 0445  BUN 13.3 06/08/2014 1046   BUN 5* 05/22/2014 0445   CREATININE 0.8 06/08/2014 1046   CREATININE 0.66 05/22/2014 0445      Component Value Date/Time   CALCIUM 9.4 06/08/2014 1046   CALCIUM 8.1* 05/22/2014 0445   ALKPHOS 90 06/08/2014 1046   ALKPHOS 67 05/20/2014 0600   AST 23 06/08/2014 1046   AST 10 05/20/2014 0600   ALT 44 06/08/2014 1046   ALT 17 05/20/2014 0600   BILITOT 0.32 06/08/2014 1046   BILITOT 0.3 05/20/2014 0600      ASSESSMENT & PLAN:  Cancer of base of tongue She tolerated reduced dose cycle 2 well. Continue supportive care  Anemia in neoplastic disease This is likely due to recent treatment. The patient denies recent history of bleeding such as epistaxis, hematuria or hematochezia. She is asymptomatic from the anemia. I will observe for now.   Leukopenia due to antineoplastic chemotherapy This is likely due to recent treatment. The patient denies recent history of fevers, cough, chills, diarrhea or dysuria. She is asymptomatic from the leukopenia. I will observe for now. Recommend neutropenic precaution   Mucositis due to chemotherapy This is due to treatment. Recommend  Biotene and Magic mouth wash.   No orders of the defined types were placed in this encounter.   All questions were answered. The patient knows to call the clinic with any problems, questions or concerns. No barriers to learning was detected. I spent 25 minutes counseling the patient face to face. The total time spent in the appointment was 30 minutes and more than 50% was on counseling and review of test results     [facility], [place], MD 06/08/2014 1:24 PM

## 2014-06-13 ENCOUNTER — Ambulatory Visit (HOSPITAL_BASED_OUTPATIENT_CLINIC_OR_DEPARTMENT_OTHER): Payer: BC Managed Care – PPO

## 2014-06-13 VITALS — BP 112/88 | HR 110 | Temp 98.1°F

## 2014-06-13 DIAGNOSIS — C01 Malignant neoplasm of base of tongue: Secondary | ICD-10-CM

## 2014-06-13 DIAGNOSIS — Z452 Encounter for adjustment and management of vascular access device: Secondary | ICD-10-CM

## 2014-06-13 MED ORDER — SODIUM CHLORIDE 0.9 % IJ SOLN
10.0000 mL | INTRAMUSCULAR | Status: DC | PRN
  Administered 2014-06-13: 10 mL via INTRAVENOUS
  Filled 2014-06-13: qty 10

## 2014-06-13 MED ORDER — HEPARIN SOD (PORK) LOCK FLUSH 100 UNIT/ML IV SOLN
500.0000 [IU] | Freq: Once | INTRAVENOUS | Status: AC
  Administered 2014-06-13: 250 [IU] via INTRAVENOUS
  Filled 2014-06-13: qty 5

## 2014-06-13 NOTE — Patient Instructions (Signed)
PICC Home Guide A peripherally inserted central catheter (PICC) is a long, thin, flexible tube that is inserted into a vein in the upper arm. It is a form of intravenous (IV) access. It is considered to be a "central" line because the tip of the PICC ends in a large vein in your chest. This large vein is called the superior vena cava (SVC). The PICC tip ends in the SVC because there is a lot of blood flow in the SVC. This allows medicines and IV fluids to be quickly distributed throughout the body. The PICC is inserted using a sterile technique by a specially trained nurse or physician. After the PICC is inserted, a chest X-ray exam is done to be sure it is in the correct place.  A PICC may be placed for different reasons, such as:  To give medicines and liquid nutrition that can only be given through a central line. Examples are:  Certain antibiotic treatments.  Chemotherapy.  Total parenteral nutrition (TPN).  To take frequent blood samples.  To give IV fluids and blood products.  If there is difficulty placing a peripheral intravenous (PIV) catheter. If taken care of properly, a PICC can remain in place for several months. A PICC can also allow a person to go home from the hospital early. Medicine and PICC care can be managed at home by a family member or home health care team. WHAT PROBLEMS CAN HAPPEN WHEN I HAVE A PICC? Problems with a PICC can occasionally occur. These may include the following:  A blood clot (thrombus) forming in or at the tip of the PICC. This can cause the PICC to become clogged. A clot-dissolving medicine called tissue plasminogen activator (tPA) can be given through the PICC to help break up the clot.  Inflammation of the vein (phlebitis) in which the PICC is placed. Signs of inflammation may include redness, pain at the insertion site, red streaks, or being able to feel a "cord" in the vein where the PICC is located.  Infection in the PICC or at the insertion  site. Signs of infection may include fever, chills, redness, swelling, or pus drainage from the PICC insertion site.  PICC movement (malposition). The PICC tip may move from its original position due to excessive physical activity, forceful coughing, sneezing, or vomiting.  A break or cut in the PICC. It is important to not use scissors near the PICC.  Nerve or tendon irritation or injury during PICC insertion. WHAT SHOULD I KEEP IN MIND ABOUT ACTIVITIES WHEN I HAVE A PICC?  You may bend your arm and move it freely. If your PICC is near or at the bend of your elbow, avoid activity with repeated motion at the elbow.  Rest at home for the remainder of the day following PICC line insertion.  Avoid lifting heavy objects as instructed by your health care provider.  Avoid using a crutch with the arm on the same side as your PICC. You may need to use a walker. WHAT SHOULD I KNOW ABOUT MY PICC DRESSING?  Keep your PICC bandage (dressing) clean and dry to prevent infection.  Ask your health care provider when you may shower. Ask your health care provider to teach you how to wrap the PICC when you do take a shower.  Change the PICC dressing as instructed by your health care provider.  Change your PICC dressing if it becomes loose or wet. WHAT SHOULD I KNOW ABOUT PICC CARE?  Check the PICC insertion site   daily for leakage, redness, swelling, or pain.  Do not take a bath, swim, or use hot tubs when you have a PICC. Cover PICC line with clear plastic wrap and tape to keep it dry while showering.  Flush the PICC as directed by your health care provider. Let your health care provider know right away if the PICC is difficult to flush or does not flush. Do not use force to flush the PICC.  Do not use a syringe that is less than 10 mL to flush the PICC.  Never pull or tug on the PICC.  Avoid blood pressure checks on the arm with the PICC.  Keep your PICC identification card with you at all  times.  Do not take the PICC out yourself. Only a trained clinical professional should remove the PICC. SEEK IMMEDIATE MEDICAL CARE IF:  Your PICC is accidentally pulled all the way out. If this happens, cover the insertion site with a bandage or gauze dressing. Do not throw the PICC away. Your health care provider will need to inspect it.  Your PICC was tugged or pulled and has partially come out. Do not  push the PICC back in.  There is any type of drainage, redness, or swelling where the PICC enters the skin.  You cannot flush the PICC, it is difficult to flush, or the PICC leaks around the insertion site when it is flushed.  You hear a "flushing" sound when the PICC is flushed.  You have pain, discomfort, or numbness in your arm, shoulder, or jaw on the same side as the PICC.  You feel your heart "racing" or skipping beats.  You notice a hole or tear in the PICC.  You develop chills or a fever. MAKE SURE YOU:   Understand these instructions.  Will watch your condition.  Will get help right away if you are not doing well or get worse. Document Released: 12/13/2002 Document Revised: 10/23/2013 Document Reviewed: 02/13/2013 Exit[employer] Patient Information 2015 [employer]. This information is not intended to replace advice given to you by your health care provider. Make sure you discuss any questions you have with your health care provider.  

## 2014-06-20 ENCOUNTER — Ambulatory Visit (HOSPITAL_BASED_OUTPATIENT_CLINIC_OR_DEPARTMENT_OTHER): Payer: BC Managed Care – PPO

## 2014-06-20 DIAGNOSIS — C01 Malignant neoplasm of base of tongue: Secondary | ICD-10-CM

## 2014-06-20 DIAGNOSIS — Z452 Encounter for adjustment and management of vascular access device: Secondary | ICD-10-CM

## 2014-06-20 DIAGNOSIS — Z95828 Presence of other vascular implants and grafts: Secondary | ICD-10-CM

## 2014-06-20 MED ORDER — HEPARIN SOD (PORK) LOCK FLUSH 100 UNIT/ML IV SOLN
500.0000 [IU] | Freq: Once | INTRAVENOUS | Status: AC
  Administered 2014-06-20: 250 [IU] via INTRAVENOUS
  Filled 2014-06-20: qty 5

## 2014-06-20 MED ORDER — SODIUM CHLORIDE 0.9 % IJ SOLN
10.0000 mL | INTRAMUSCULAR | Status: DC | PRN
  Administered 2014-06-20: 10 mL via INTRAVENOUS
  Filled 2014-06-20: qty 10

## 2014-06-20 NOTE — Patient Instructions (Signed)
PICC Home Guide A peripherally inserted central catheter (PICC) is a long, thin, flexible tube that is inserted into a vein in the upper arm. It is a form of intravenous (IV) access. It is considered to be a "central" line because the tip of the PICC ends in a large vein in your chest. This large vein is called the superior vena cava (SVC). The PICC tip ends in the SVC because there is a lot of blood flow in the SVC. This allows medicines and IV fluids to be quickly distributed throughout the body. The PICC is inserted using a sterile technique by a specially trained nurse or physician. After the PICC is inserted, a chest X-ray exam is done to be sure it is in the correct place.  A PICC may be placed for different reasons, such as:  To give medicines and liquid nutrition that can only be given through a central line. Examples are:  Certain antibiotic treatments.  Chemotherapy.  Total parenteral nutrition (TPN).  To take frequent blood samples.  To give IV fluids and blood products.  If there is difficulty placing a peripheral intravenous (PIV) catheter. If taken care of properly, a PICC can remain in place for several months. A PICC can also allow a person to go home from the hospital early. Medicine and PICC care can be managed at home by a family member or home health care team. WHAT PROBLEMS CAN HAPPEN WHEN I HAVE A PICC? Problems with a PICC can occasionally occur. These may include the following:  A blood clot (thrombus) forming in or at the tip of the PICC. This can cause the PICC to become clogged. A clot-dissolving medicine called tissue plasminogen activator (tPA) can be given through the PICC to help break up the clot.  Inflammation of the vein (phlebitis) in which the PICC is placed. Signs of inflammation may include redness, pain at the insertion site, red streaks, or being able to feel a "cord" in the vein where the PICC is located.  Infection in the PICC or at the insertion  site. Signs of infection may include fever, chills, redness, swelling, or pus drainage from the PICC insertion site.  PICC movement (malposition). The PICC tip may move from its original position due to excessive physical activity, forceful coughing, sneezing, or vomiting.  A break or cut in the PICC. It is important to not use scissors near the PICC.  Nerve or tendon irritation or injury during PICC insertion. WHAT SHOULD I KEEP IN MIND ABOUT ACTIVITIES WHEN I HAVE A PICC?  You may bend your arm and move it freely. If your PICC is near or at the bend of your elbow, avoid activity with repeated motion at the elbow.  Rest at home for the remainder of the day following PICC line insertion.  Avoid lifting heavy objects as instructed by your health care provider.  Avoid using a crutch with the arm on the same side as your PICC. You may need to use a walker. WHAT SHOULD I KNOW ABOUT MY PICC DRESSING?  Keep your PICC bandage (dressing) clean and dry to prevent infection.  Ask your health care provider when you may shower. Ask your health care provider to teach you how to wrap the PICC when you do take a shower.  Change the PICC dressing as instructed by your health care provider.  Change your PICC dressing if it becomes loose or wet. WHAT SHOULD I KNOW ABOUT PICC CARE?  Check the PICC insertion site   daily for leakage, redness, swelling, or pain.  Do not take a bath, swim, or use hot tubs when you have a PICC. Cover PICC line with clear plastic wrap and tape to keep it dry while showering.  Flush the PICC as directed by your health care provider. Let your health care provider know right away if the PICC is difficult to flush or does not flush. Do not use force to flush the PICC.  Do not use a syringe that is less than 10 mL to flush the PICC.  Never pull or tug on the PICC.  Avoid blood pressure checks on the arm with the PICC.  Keep your PICC identification card with you at all  times.  Do not take the PICC out yourself. Only a trained clinical professional should remove the PICC. SEEK IMMEDIATE MEDICAL CARE IF:  Your PICC is accidentally pulled all the way out. If this happens, cover the insertion site with a bandage or gauze dressing. Do not throw the PICC away. Your health care provider will need to inspect it.  Your PICC was tugged or pulled and has partially come out. Do not  push the PICC back in.  There is any type of drainage, redness, or swelling where the PICC enters the skin.  You cannot flush the PICC, it is difficult to flush, or the PICC leaks around the insertion site when it is flushed.  You hear a "flushing" sound when the PICC is flushed.  You have pain, discomfort, or numbness in your arm, shoulder, or jaw on the same side as the PICC.  You feel your heart "racing" or skipping beats.  You notice a hole or tear in the PICC.  You develop chills or a fever. MAKE SURE YOU:   Understand these instructions.  Will watch your condition.  Will get help right away if you are not doing well or get worse. Document Released: 12/13/2002 Document Revised: 10/23/2013 Document Reviewed: 02/13/2013 Exit[employer] Patient Information 2015 [employer]. This information is not intended to replace advice given to you by your health care provider. Make sure you discuss any questions you have with your health care provider.  

## 2014-06-21 ENCOUNTER — Ambulatory Visit: Payer: BC Managed Care – PPO

## 2014-06-21 ENCOUNTER — Ambulatory Visit (HOSPITAL_BASED_OUTPATIENT_CLINIC_OR_DEPARTMENT_OTHER): Payer: BC Managed Care – PPO | Admitting: Hematology and Oncology

## 2014-06-21 ENCOUNTER — Other Ambulatory Visit (HOSPITAL_BASED_OUTPATIENT_CLINIC_OR_DEPARTMENT_OTHER): Payer: BC Managed Care – PPO

## 2014-06-21 ENCOUNTER — Ambulatory Visit: Payer: BC Managed Care – PPO | Admitting: Nutrition

## 2014-06-21 ENCOUNTER — Encounter: Payer: Self-pay | Admitting: Hematology and Oncology

## 2014-06-21 ENCOUNTER — Telehealth: Payer: Self-pay | Admitting: Hematology and Oncology

## 2014-06-21 ENCOUNTER — Encounter: Payer: Self-pay | Admitting: *Deleted

## 2014-06-21 ENCOUNTER — Ambulatory Visit (HOSPITAL_BASED_OUTPATIENT_CLINIC_OR_DEPARTMENT_OTHER): Payer: BC Managed Care – PPO

## 2014-06-21 VITALS — BP 135/68 | HR 99 | Temp 98.0°F | Resp 18 | Wt 202.0 lb

## 2014-06-21 DIAGNOSIS — C01 Malignant neoplasm of base of tongue: Secondary | ICD-10-CM

## 2014-06-21 DIAGNOSIS — T451X5A Adverse effect of antineoplastic and immunosuppressive drugs, initial encounter: Secondary | ICD-10-CM

## 2014-06-21 DIAGNOSIS — Z95828 Presence of other vascular implants and grafts: Secondary | ICD-10-CM

## 2014-06-21 DIAGNOSIS — G620 Drug-induced polyneuropathy: Secondary | ICD-10-CM

## 2014-06-21 DIAGNOSIS — E119 Type 2 diabetes mellitus without complications: Secondary | ICD-10-CM

## 2014-06-21 DIAGNOSIS — D630 Anemia in neoplastic disease: Secondary | ICD-10-CM

## 2014-06-21 DIAGNOSIS — K1231 Oral mucositis (ulcerative) due to antineoplastic therapy: Secondary | ICD-10-CM

## 2014-06-21 DIAGNOSIS — Z5111 Encounter for antineoplastic chemotherapy: Secondary | ICD-10-CM

## 2014-06-21 DIAGNOSIS — Z9889 Other specified postprocedural states: Secondary | ICD-10-CM

## 2014-06-21 DIAGNOSIS — Z452 Encounter for adjustment and management of vascular access device: Secondary | ICD-10-CM

## 2014-06-21 DIAGNOSIS — K5909 Other constipation: Secondary | ICD-10-CM | POA: Insufficient documentation

## 2014-06-21 LAB — COMPREHENSIVE METABOLIC PANEL (CC13)
ALBUMIN: 3.7 g/dL (ref 3.5–5.0)
ALT: 31 U/L (ref 0–55)
ANION GAP: 10 meq/L (ref 3–11)
AST: 14 U/L (ref 5–34)
Alkaline Phosphatase: 128 U/L (ref 40–150)
BUN: 18.5 mg/dL (ref 7.0–26.0)
CHLORIDE: 101 meq/L (ref 98–109)
CO2: 26 meq/L (ref 22–29)
CREATININE: 0.9 mg/dL (ref 0.6–1.1)
Calcium: 9.7 mg/dL (ref 8.4–10.4)
EGFR: 75 mL/min/{1.73_m2} — ABNORMAL LOW (ref >90)
Glucose: 261 mg/dl — ABNORMAL HIGH (ref 70–140)
Potassium: 4.3 mEq/L (ref 3.5–5.1)
Sodium: 138 mEq/L (ref 136–145)
Total Bilirubin: 0.41 mg/dL (ref 0.20–1.20)
Total Protein: 6.9 g/dL (ref 6.4–8.3)

## 2014-06-21 LAB — CBC WITH DIFFERENTIAL/PLATELET
BASO%: 0.1 % (ref 0.0–2.0)
BASOS ABS: 0 10*3/uL (ref 0.0–0.1)
EOS%: 0 % (ref 0.0–7.0)
Eosinophils Absolute: 0 10*3/uL (ref 0.0–0.5)
HEMATOCRIT: 31.7 % — AB (ref 34.8–46.6)
HEMOGLOBIN: 10.3 g/dL — AB (ref 11.6–15.9)
LYMPH#: 1.4 10*3/uL (ref 0.9–3.3)
LYMPH%: 9.2 % — ABNORMAL LOW (ref 14.0–49.7)
MCH: 30.5 pg (ref 25.1–34.0)
MCHC: 32.5 g/dL (ref 31.5–36.0)
MCV: 93.8 fL (ref 79.5–101.0)
MONO#: 0.6 10*3/uL (ref 0.1–0.9)
MONO%: 4.1 % (ref 0.0–14.0)
NEUT#: 13.2 10*3/uL — ABNORMAL HIGH (ref 1.5–6.5)
NEUT%: 86.6 % — ABNORMAL HIGH (ref 38.4–76.8)
PLATELETS: 188 10*3/uL (ref 145–400)
RBC: 3.38 10*6/uL — ABNORMAL LOW (ref 3.70–5.45)
RDW: 16.5 % — ABNORMAL HIGH (ref 11.2–14.5)
WBC: 15.2 10*3/uL — ABNORMAL HIGH (ref 3.9–10.3)

## 2014-06-21 LAB — HOLD TUBE, BLOOD BANK

## 2014-06-21 MED ORDER — SODIUM CHLORIDE 0.9 % IV SOLN
150.0000 mg | Freq: Once | INTRAVENOUS | Status: AC
  Administered 2014-06-21: 150 mg via INTRAVENOUS
  Filled 2014-06-21: qty 5

## 2014-06-21 MED ORDER — DEXAMETHASONE SODIUM PHOSPHATE 20 MG/5ML IJ SOLN
INTRAMUSCULAR | Status: AC
  Filled 2014-06-21: qty 5

## 2014-06-21 MED ORDER — SODIUM CHLORIDE 0.9 % IJ SOLN
10.0000 mL | INTRAMUSCULAR | Status: DC | PRN
Start: 2014-06-21 — End: 2014-06-21
  Filled 2014-06-21: qty 10

## 2014-06-21 MED ORDER — SODIUM CHLORIDE 0.9 % IJ SOLN
10.0000 mL | INTRAMUSCULAR | Status: DC | PRN
  Administered 2014-06-21: 10 mL via INTRAVENOUS
  Filled 2014-06-21: qty 10

## 2014-06-21 MED ORDER — DEXAMETHASONE SODIUM PHOSPHATE 20 MG/5ML IJ SOLN
12.0000 mg | Freq: Once | INTRAMUSCULAR | Status: AC
  Administered 2014-06-21: 12 mg via INTRAVENOUS

## 2014-06-21 MED ORDER — HEPARIN SOD (PORK) LOCK FLUSH 100 UNIT/ML IV SOLN
500.0000 [IU] | Freq: Once | INTRAVENOUS | Status: DC | PRN
  Filled 2014-06-21: qty 5

## 2014-06-21 MED ORDER — SODIUM CHLORIDE 0.9 % IV SOLN
75.0000 mg/m2 | Freq: Once | INTRAVENOUS | Status: AC
  Administered 2014-06-21: 161 mg via INTRAVENOUS
  Filled 2014-06-21: qty 161

## 2014-06-21 MED ORDER — SODIUM CHLORIDE 0.9 % IV SOLN
600.0000 mg/m2/d | INTRAVENOUS | Status: DC
  Administered 2014-06-21: 6450 mg via INTRAVENOUS
  Filled 2014-06-21: qty 129

## 2014-06-21 MED ORDER — POTASSIUM CHLORIDE 2 MEQ/ML IV SOLN
Freq: Once | INTRAVENOUS | Status: AC
  Administered 2014-06-21: 10:00:00 via INTRAVENOUS
  Filled 2014-06-21: qty 10

## 2014-06-21 MED ORDER — PALONOSETRON HCL INJECTION 0.25 MG/5ML
INTRAVENOUS | Status: AC
  Filled 2014-06-21: qty 5

## 2014-06-21 MED ORDER — SODIUM CHLORIDE 0.9 % IV SOLN
Freq: Once | INTRAVENOUS | Status: AC
  Administered 2014-06-21: 10:00:00 via INTRAVENOUS

## 2014-06-21 MED ORDER — DOCETAXEL CHEMO INJECTION 160 MG/16ML
75.0000 mg/m2 | Freq: Once | INTRAVENOUS | Status: AC
  Administered 2014-06-21: 160 mg via INTRAVENOUS
  Filled 2014-06-21: qty 16

## 2014-06-21 MED ORDER — PALONOSETRON HCL INJECTION 0.25 MG/5ML
0.2500 mg | Freq: Once | INTRAVENOUS | Status: AC
  Administered 2014-06-21: 0.25 mg via INTRAVENOUS

## 2014-06-21 MED ORDER — HEPARIN SOD (PORK) LOCK FLUSH 100 UNIT/ML IV SOLN
500.0000 [IU] | Freq: Once | INTRAVENOUS | Status: AC
  Administered 2014-06-21: 500 [IU] via INTRAVENOUS
  Filled 2014-06-21: qty 5

## 2014-06-21 NOTE — Progress Notes (Signed)
[facility] OFFICE PROGRESS NOTE  Patient Care Team: Gaynelle Arabian, MD as PCP - General (Family Medicine) Brooks Sailors, RN as Oncology Nurse [place], RD as Dietitian (Nutrition) Eppie Gibson, MD as Attending Physician (Radiation Oncology)  SUMMARY OF ONCOLOGIC HISTORY: Oncology History   Cancer of base of tongue   Staging form: Lip and Oral Cavity, AJCC 7th Edition     Clinical: Stage IVA (T3, N2b, M0) - Signed by Heath Lark, MD on 05/09/2014       Cancer of base of tongue   04/20/2014 Imaging CT neck showed advanced base of tongue mass and multiple right neck lymphadenopathy   04/23/2014 Pathology Results [id number] FNA of right neck is positive for squamous cell cancer, P16 positive   05/09/2014 Imaging PET/CT scan confirmed base of tongue cancer along with lymph node metastasis.   05/10/2014 Procedure She has placement of PICC line.   05/11/2014 -  Chemotherapy She received induction chemotherapy with Taxotere, cisplatin and 5-FU.   05/19/2014 - 05/22/2014 Hospital Admission She was admitted to the hospital for management of neutropenic fever, GI bleed requiring blood transfusion and diarrhea from chemotherapy    INTERVAL HISTORY: Please see below for problem oriented charting. She is seen prior to cycle 3 of chemotherapy. She complained of very mild peripheral neuropathy and very mild mucositis, resolved. She has some constipation. Denies nausea or vomiting.  REVIEW OF SYSTEMS:   Constitutional: Denies fevers, chills or abnormal weight loss Eyes: Denies blurriness of vision Respiratory: Denies cough, dyspnea or wheezes Cardiovascular: Denies palpitation, chest discomfort or lower extremity swelling Skin: Denies abnormal skin rashes Lymphatics: Denies new lymphadenopathy or easy bruising Behavioral/Psych: Mood is stable, no new changes  All other systems were reviewed with the patient and are negative.  I have reviewed the past medical  history, past surgical history, social history and family history with the patient and they are unchanged from previous note.  ALLERGIES:  is allergic to penicillins.  MEDICATIONS:  Current Outpatient Prescriptions  Medication Sig Dispense Refill  . ALPRAZolam (XANAX) 0.25 MG tablet Take 0.25 mg by mouth as needed for anxiety.    . Alum & Mag Hydroxide-Simeth (MAGIC MOUTHWASH) SOLN Take 5 mLs by mouth 4 (four) times daily as needed for mouth pain. 500 mL 0  . amLODipine (NORVASC) 10 MG tablet Take 10 mg by mouth daily.    Marland Kitchen atorvastatin (LIPITOR) 10 MG tablet Take 10 mg by mouth daily.    Marland Kitchen dexamethasone (DECADRON) 4 MG tablet Start the day before and the day after chemotherapy, 2 times a day every 3 weeks 30 tablet 1  . levofloxacin (LEVAQUIN) 500 MG tablet Take 1 tablet (500 mg total) by mouth daily. 10 tablet 0  . loperamide (IMODIUM) 2 MG capsule Take 2 mg by mouth as needed for diarrhea or loose stools.    Marland Kitchen losartan-hydrochlorothiazide (HYZAAR) 100-12.5 MG per tablet     . metFORMIN (GLUCOPHAGE) 500 MG tablet Take 500 mg by mouth daily with breakfast.    . ondansetron (ZOFRAN) 8 MG tablet Take 1 tablet (8 mg total) by mouth every 8 (eight) hours as needed (Nausea or vomiting). 90 tablet 1  . pantoprazole (PROTONIX) 40 MG tablet Take 1 tablet (40 mg total) by mouth daily. 90 tablet 2  . phosphorus (K PHOS NEUTRAL) 155-852-130 MG tablet Take 1 tablet (250 mg total) by mouth 2 (two) times daily. (Patient not taking: Reported on 06/08/2014) 10 tablet 0  . Prenatal Vit-Fe Fumarate-FA (  MULTIVITAMIN-PRENATAL) 27-0.8 MG TABS tablet Take 1 tablet by mouth daily at 12 noon.    Marland Kitchen PRESCRIPTION MEDICATION CHEMO [facility]    . prochlorperazine (COMPAZINE) 10 MG tablet Take 1 tablet (10 mg total) by mouth every 6 (six) hours as needed (Nausea or vomiting). 30 tablet 1  . Sodium Chloride Flush 0.9 % SOLN injection Inject 10 mLs into the vein daily. 60 Syringe 3  . sodium fluoride (FLUORISHIELD) 1.1 % GEL  dental gel Instill one drop of gel per tooth space of fluoride tray. Place over teeth for 5 minutes. Remove. Spit out excess. Repeat nightly. 120 mL prn  . UNABLE TO FIND Med Name: wigs 2 each 0   No current facility-administered medications for this visit.   Facility-Administered Medications Ordered in Other Visits  Medication Dose Route Frequency Provider Last Rate Last Dose  . 0.9 %  sodium chloride infusion   Intravenous Once Heath Lark, MD      . CISplatin (PLATINOL) 161 mg in sodium chloride 0.9 % 500 mL chemo infusion  75 mg/m2 (Treatment Plan Actual) Intravenous Once Heath Lark, MD      . Dexamethasone Sodium Phosphate (DECADRON) injection 12 mg  12 mg Intravenous Once Heath Lark, MD      . DOCEtaxel (TAXOTERE) 160 mg in dextrose 5 % 250 mL chemo infusion  75 mg/m2 (Treatment Plan Actual) Intravenous Once Heath Lark, MD      . fluorouracil (ADRUCIL) 6,450 mg in sodium chloride 0.9 % 150 mL chemo infusion  600 mg/m2/day (Treatment Plan Actual) Intravenous 5 days Heath Lark, MD      . fosaprepitant (EMEND) 150 mg in sodium chloride 0.9 % 145 mL IVPB  150 mg Intravenous Once Heath Lark, MD      . heparin lock flush 100 unit/mL  500 Units Intracatheter Once PRN Heath Lark, MD      . palonosetron (ALOXI) injection 0.25 mg  0.25 mg Intravenous Once Heath Lark, MD      . sodium chloride 0.9 % injection 10 mL  10 mL Intracatheter PRN Heath Lark, MD        PHYSICAL EXAMINATION: ECOG PERFORMANCE STATUS: 1 - Symptomatic but completely ambulatory  Filed Vitals:   06/21/14 0846  BP: 135/68  Pulse: 99  Temp: 98 F (36.7 C)  Resp: 18   Filed Weights   06/21/14 0851  Weight: 202 lb (91.627 kg)    GENERAL:alert, no distress and comfortable SKIN: skin color, texture, turgor are normal, no rashes or significant lesions EYES: normal, Conjunctiva are pink and non-injected, sclera clear  Musculoskeletal:no cyanosis of digits and no clubbing  NEURO: alert & oriented x 3 with fluent speech, no  focal motor/sensory deficits  LABORATORY DATA:  I have reviewed the data as listed    Component Value Date/Time   NA 138 06/21/2014 0809   NA 138 05/22/2014 0445   K 4.3 06/21/2014 0809   K 3.0* 05/22/2014 0445   CL 101 05/22/2014 0445   CO2 26 06/21/2014 0809   CO2 26 05/22/2014 0445   GLUCOSE 261* 06/21/2014 0809   GLUCOSE 144* 05/22/2014 0445   BUN 18.5 06/21/2014 0809   BUN 5* 05/22/2014 0445   CREATININE 0.9 06/21/2014 0809   CREATININE 0.66 05/22/2014 0445   CALCIUM 9.7 06/21/2014 0809   CALCIUM 8.1* 05/22/2014 0445   PROT 6.9 06/21/2014 0809   PROT 5.9* 05/20/2014 0600   ALBUMIN 3.7 06/21/2014 0809   ALBUMIN 2.5* 05/20/2014 0600   AST 14 06/21/2014 0809  AST 10 05/20/2014 0600   ALT 31 06/21/2014 0809   ALT 17 05/20/2014 0600   ALKPHOS 128 06/21/2014 0809   ALKPHOS 67 05/20/2014 0600   BILITOT 0.41 06/21/2014 0809   BILITOT 0.3 05/20/2014 0600   GFRNONAA >90 05/22/2014 0445   GFRAA >90 05/22/2014 0445    No results found for: SPEP, UPEP  Lab Results  Component Value Date   WBC 15.2* 06/21/2014   NEUTROABS 13.2* 06/21/2014   HGB 10.3* 06/21/2014   HCT 31.7* 06/21/2014   MCV 93.8 06/21/2014   PLT 188 06/21/2014      Chemistry      Component Value Date/Time   NA 138 06/21/2014 0809   NA 138 05/22/2014 0445   K 4.3 06/21/2014 0809   K 3.0* 05/22/2014 0445   CL 101 05/22/2014 0445   CO2 26 06/21/2014 0809   CO2 26 05/22/2014 0445   BUN 18.5 06/21/2014 0809   BUN 5* 05/22/2014 0445   CREATININE 0.9 06/21/2014 0809   CREATININE 0.66 05/22/2014 0445      Component Value Date/Time   CALCIUM 9.7 06/21/2014 0809   CALCIUM 8.1* 05/22/2014 0445   ALKPHOS 128 06/21/2014 0809   ALKPHOS 67 05/20/2014 0600   AST 14 06/21/2014 0809   AST 10 05/20/2014 0600   ALT 31 06/21/2014 0809   ALT 17 05/20/2014 0600   BILITOT 0.41 06/21/2014 0809   BILITOT 0.3 05/20/2014 0600      ASSESSMENT & PLAN:  Cancer of base of tongue She tolerated reduced dose cycle  2 well. Continue supportive care. I will proceed with cycle 3 of treatment in the same as before. I plan to repeat PET/CT scan at the end of this month to review test. Clinically, she is responding very well to treatment. She will continue to return here on a weekly basis for PICC line care until results of PET CT scan is available and confirmed that the patient has achieved very good response to treatment.  Anemia in neoplastic disease This is likely due to recent treatment. The patient denies recent history of bleeding such as epistaxis, hematuria or hematochezia. She is asymptomatic from the anemia. I will observe for now.  Mucositis due to chemotherapy This is due to treatment. Recommend Biotene and Magic mouth wash.  Neuropathy due to chemotherapeutic drug She has mild peripheral neuropathy from chemotherapy. I suspect this will improve after discontinuation of treatment. This is only grade 1 and recommend close monitoring only.  Diabetes mellitus type 2, controlled, without complications She will monitor her blood sugar carefully. If her blood sugar is consistently over 200, she may need dose adjustment to metformin.  S/P PICC central line placement She will continue PICC line flushes at home and PICC line dressing change every week. Clinically, it appears to be working well.    Other constipation She will continue her relax daily. I recommend the addition of Senokot twice a day.   Orders Placed This Encounter  Procedures  . NM PET Image Restag (PS) Skull Base To Thigh    Standing Status: Future     Number of Occurrences:      Standing Expiration Date: 08/21/2015    Order Specific Question:  Reason for Exam (SYMPTOM  OR DIAGNOSIS REQUIRED)    Answer:  staging tongue ca, assess response to Rx    Order Specific Question:  Is the patient pregnant?    Answer:  No    Order Specific Question:  Preferred imaging location?  Answer:  [facility]   All questions were  answered. The patient knows to call the clinic with any problems, questions or concerns. No barriers to learning was detected. I spent 30 minutes counseling the patient face to face. The total time spent in the appointment was 40 minutes and more than 50% was on counseling and review of test results     [facility], [place], MD 06/21/2014 11:14 AM

## 2014-06-21 NOTE — Assessment & Plan Note (Signed)
This is likely due to recent treatment. The patient denies recent history of bleeding such as epistaxis, hematuria or hematochezia. She is asymptomatic from the anemia. I will observe for now.   

## 2014-06-21 NOTE — Patient Instructions (Signed)
[facility] Discharge Instructions for Patients Receiving Chemotherapy  Today you received the following chemotherapy agents:  Taxotere, Cisplatin and Fluorouracil.    To help prevent nausea and vomiting after your treatment, we encourage you to take your nausea medication as directed.    If you develop nausea and vomiting that is not controlled by your nausea medication, call the clinic.   BELOW ARE SYMPTOMS THAT SHOULD BE REPORTED IMMEDIATELY:  *FEVER GREATER THAN 100.5 F  *CHILLS WITH OR WITHOUT FEVER  NAUSEA AND VOMITING THAT IS NOT CONTROLLED WITH YOUR NAUSEA MEDICATION  *UNUSUAL SHORTNESS OF BREATH  *UNUSUAL BRUISING OR BLEEDING  TENDERNESS IN MOUTH AND THROAT WITH OR WITHOUT PRESENCE OF ULCERS  *URINARY PROBLEMS  *BOWEL PROBLEMS  UNUSUAL RASH Items with * indicate a potential emergency and should be followed up as soon as possible.  Feel free to call the clinic you have any questions or concerns. The clinic phone number is [phone].

## 2014-06-21 NOTE — Assessment & Plan Note (Signed)
She will continue PICC line flushes at home and PICC line dressing change every week. Clinically, it appears to be working well.

## 2014-06-21 NOTE — Assessment & Plan Note (Signed)
She tolerated reduced dose cycle 2 well. Continue supportive care. I will proceed with cycle 3 of treatment in the same as before. I plan to repeat PET/CT scan at the end of this month to review test. Clinically, she is responding very well to treatment. She will continue to return here on a weekly basis for PICC line care until results of PET CT scan is available and confirmed that the patient has achieved very good response to treatment.

## 2014-06-21 NOTE — Progress Notes (Signed)
To provide support and encouragement, care continuity and to assess for needs, met with patient during her final chemo tmt.   1. She understands that she will have a PET in late January to facilitate staging for start of RT. 2. She reported she has been keeping appts with Garald Balding, has been diligent in conducting swallowing exercises per his guidance. 3. I answered her questions re: radiation tmt. She understands she can contact me with questions/concerns as she moves forward with the next phase of her tmt.  [name], RN, BSN, [facility] [place] [phone]

## 2014-06-21 NOTE — Assessment & Plan Note (Signed)
This is due to treatment. Recommend Biotene and Magic mouth wash.

## 2014-06-21 NOTE — Progress Notes (Signed)
Pt voided 100 cc and another 150 cc for total 250 cc.  Pt voided another 275 cc. Pt reports another 450 cc urine output.

## 2014-06-21 NOTE — Progress Notes (Signed)
Patient reports today is her last treatment.  She has been receiving induction chemotherapy. She is doing well and has a good appetite. Oral intake has improved. Weight increased and documented as 202 pounds December 31 from 194.9 pounds December 18.  Patient has no nutrition issues.  Nutrition diagnosis: Predicted suboptimal energy intake continues.  Intervention: Educated patient to continue smaller more frequent meals and strive for weight maintenance. Encouraged increased fluids and protein. Teach back method used.  Monitoring, evaluation, goals: Patient will tolerate adequate calories and protein to minimize weight loss throughout treatment.  Next visit: To be scheduled as needed.  Will monitor plans for radiation therapy.  **Disclaimer: This note was dictated with voice recognition software. Similar sounding words can inadvertently be transcribed and this note may contain transcription errors which may not have been corrected upon publication of note.**

## 2014-06-21 NOTE — Assessment & Plan Note (Signed)
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 I suspect this will improve after discontinuation of treatment. This is only grade 1 and recommend close monitoring only.

## 2014-06-21 NOTE — Patient Instructions (Signed)
PICC Home Guide A peripherally inserted central catheter (PICC) is a long, thin, flexible tube that is inserted into a vein in the upper arm. It is a form of intravenous (IV) access. It is considered to be a "central" line because the tip of the PICC ends in a large vein in your chest. This large vein is called the superior vena cava (SVC). The PICC tip ends in the SVC because there is a lot of blood flow in the SVC. This allows medicines and IV fluids to be quickly distributed throughout the body. The PICC is inserted using a sterile technique by a specially trained nurse or physician. After the PICC is inserted, a chest X-ray exam is done to be sure it is in the correct place.  A PICC may be placed for different reasons, such as:  To give medicines and liquid nutrition that can only be given through a central line. Examples are:  Certain antibiotic treatments.  Chemotherapy.  Total parenteral nutrition (TPN).  To take frequent blood samples.  To give IV fluids and blood products.  If there is difficulty placing a peripheral intravenous (PIV) catheter. If taken care of properly, a PICC can remain in place for several months. A PICC can also allow a person to go home from the hospital early. Medicine and PICC care can be managed at home by a family member or home health care team. WHAT PROBLEMS CAN HAPPEN WHEN I HAVE A PICC? Problems with a PICC can occasionally occur. These may include the following:  A blood clot (thrombus) forming in or at the tip of the PICC. This can cause the PICC to become clogged. A clot-dissolving medicine called tissue plasminogen activator (tPA) can be given through the PICC to help break up the clot.  Inflammation of the vein (phlebitis) in which the PICC is placed. Signs of inflammation may include redness, pain at the insertion site, red streaks, or being able to feel a "cord" in the vein where the PICC is located.  Infection in the PICC or at the insertion  site. Signs of infection may include fever, chills, redness, swelling, or pus drainage from the PICC insertion site.  PICC movement (malposition). The PICC tip may move from its original position due to excessive physical activity, forceful coughing, sneezing, or vomiting.  A break or cut in the PICC. It is important to not use scissors near the PICC.  Nerve or tendon irritation or injury during PICC insertion. WHAT SHOULD I KEEP IN MIND ABOUT ACTIVITIES WHEN I HAVE A PICC?  You may bend your arm and move it freely. If your PICC is near or at the bend of your elbow, avoid activity with repeated motion at the elbow.  Rest at home for the remainder of the day following PICC line insertion.  Avoid lifting heavy objects as instructed by your health care provider.  Avoid using a crutch with the arm on the same side as your PICC. You may need to use a walker. WHAT SHOULD I KNOW ABOUT MY PICC DRESSING?  Keep your PICC bandage (dressing) clean and dry to prevent infection.  Ask your health care provider when you may shower. Ask your health care provider to teach you how to wrap the PICC when you do take a shower.  Change the PICC dressing as instructed by your health care provider.  Change your PICC dressing if it becomes loose or wet. WHAT SHOULD I KNOW ABOUT PICC CARE?  Check the PICC insertion site   daily for leakage, redness, swelling, or pain.  Do not take a bath, swim, or use hot tubs when you have a PICC. Cover PICC line with clear plastic wrap and tape to keep it dry while showering.  Flush the PICC as directed by your health care provider. Let your health care provider know right away if the PICC is difficult to flush or does not flush. Do not use force to flush the PICC.  Do not use a syringe that is less than 10 mL to flush the PICC.  Never pull or tug on the PICC.  Avoid blood pressure checks on the arm with the PICC.  Keep your PICC identification card with you at all  times.  Do not take the PICC out yourself. Only a trained clinical professional should remove the PICC. SEEK IMMEDIATE MEDICAL CARE IF:  Your PICC is accidentally pulled all the way out. If this happens, cover the insertion site with a bandage or gauze dressing. Do not throw the PICC away. Your health care provider will need to inspect it.  Your PICC was tugged or pulled and has partially come out. Do not  push the PICC back in.  There is any type of drainage, redness, or swelling where the PICC enters the skin.  You cannot flush the PICC, it is difficult to flush, or the PICC leaks around the insertion site when it is flushed.  You hear a "flushing" sound when the PICC is flushed.  You have pain, discomfort, or numbness in your arm, shoulder, or jaw on the same side as the PICC.  You feel your heart "racing" or skipping beats.  You notice a hole or tear in the PICC.  You develop chills or a fever. MAKE SURE YOU:   Understand these instructions.  Will watch your condition.  Will get help right away if you are not doing well or get worse. Document Released: 12/13/2002 Document Revised: 10/23/2013 Document Reviewed: 02/13/2013 Exit[employer] Patient Information 2015 [employer]. This information is not intended to replace advice given to you by your health care provider. Make sure you discuss any questions you have with your health care provider.  

## 2014-06-21 NOTE — Assessment & Plan Note (Signed)
She will monitor her blood sugar carefully. If her blood sugar is consistently over 200, she may need dose adjustment to metformin.

## 2014-06-21 NOTE — Assessment & Plan Note (Signed)
She will continue her relax daily. I recommend the addition of Senokot twice a day.

## 2014-06-21 NOTE — Telephone Encounter (Signed)
Gave avs & cal for Jan. °

## 2014-06-26 ENCOUNTER — Ambulatory Visit: Payer: Self-pay

## 2014-06-26 ENCOUNTER — Ambulatory Visit: Payer: Self-pay | Admitting: Nutrition

## 2014-06-26 ENCOUNTER — Ambulatory Visit (HOSPITAL_BASED_OUTPATIENT_CLINIC_OR_DEPARTMENT_OTHER): Payer: BLUE CROSS/BLUE SHIELD

## 2014-06-26 DIAGNOSIS — C01 Malignant neoplasm of base of tongue: Secondary | ICD-10-CM

## 2014-06-26 DIAGNOSIS — Z5189 Encounter for other specified aftercare: Secondary | ICD-10-CM

## 2014-06-26 MED ORDER — HEPARIN SOD (PORK) LOCK FLUSH 100 UNIT/ML IV SOLN
500.0000 [IU] | Freq: Once | INTRAVENOUS | Status: AC | PRN
  Administered 2014-06-26: 500 [IU]
  Filled 2014-06-26: qty 5

## 2014-06-26 MED ORDER — SODIUM CHLORIDE 0.9 % IJ SOLN
10.0000 mL | INTRAMUSCULAR | Status: DC | PRN
Start: 2014-06-26 — End: 2014-06-26
  Administered 2014-06-26: 10 mL
  Filled 2014-06-26: qty 10

## 2014-06-26 MED ORDER — PEGFILGRASTIM INJECTION 6 MG/0.6ML ~~LOC~~
6.0000 mg | PREFILLED_SYRINGE | Freq: Once | SUBCUTANEOUS | Status: AC
  Administered 2014-06-26: 6 mg via SUBCUTANEOUS
  Filled 2014-06-26: qty 0.6

## 2014-06-26 NOTE — Progress Notes (Signed)
Brief nutrition follow-up with patient in the lobby. Patient reports she feels terrible. She has increased nausea and describes taste alterations with food tasting metallic. Provided education on strategies for eating with taste alterations. Provided fact sheet for patient. Recommended patient try bland, cold foods as well as ginger ale or ginger tea. Encouraged increased hydration. Patient is appreciative of nutrition information.  **Disclaimer: This note was dictated with voice recognition software. Similar sounding words can inadvertently be transcribed and this note may contain transcription errors which may not have been corrected upon publication of note.**

## 2014-06-26 NOTE — Progress Notes (Signed)
Pt in for pump d/c for last treatment, pt states she had some leg pain last night and had to get up to take a xanax so she could sleep. Informed pt she can take over the counter medications to relieve pain and if it worsened or did not help to call the cancer center. Pt received neulasta today as well and reviewed the potential side effects of bone pain as well.Pt verbalized understanding  Pt pump was d/c'd, PICC dressing was changes and injection given, pt tolerated well and denies any other questions or concerns.

## 2014-06-26 NOTE — Progress Notes (Signed)
Patient given neulasta by flush nurse

## 2014-06-26 NOTE — Patient Instructions (Signed)

## 2014-06-28 ENCOUNTER — Telehealth: Payer: Self-pay | Admitting: *Deleted

## 2014-06-28 MED ORDER — NYSTATIN-TRIAMCINOLONE 100000-0.1 UNIT/GM-% EX OINT: 1.0000 "application " | TOPICAL_OINTMENT | Freq: Three times a day (TID) | CUTANEOUS | Status: DC

## 2014-06-28 NOTE — Telephone Encounter (Signed)
Pt reports very red and irritated rash under her right breast.  Now spreading to her other breast.  Started a few days ago and now getting redder.  States has had shingles in the past and it does not look like shingles, rash is flat and no raised bumps.  Notified Dr Alvy Bimler and she ordered nystatin cream to use to area TID.   Rx sent [facility] in [place].  Instructed pt to start using tonight.  Call back tomorrow if rash worsens.  She verbalized understanding.

## 2014-07-04 ENCOUNTER — Ambulatory Visit (HOSPITAL_BASED_OUTPATIENT_CLINIC_OR_DEPARTMENT_OTHER): Payer: BLUE CROSS/BLUE SHIELD

## 2014-07-04 DIAGNOSIS — C01 Malignant neoplasm of base of tongue: Secondary | ICD-10-CM

## 2014-07-04 DIAGNOSIS — Z452 Encounter for adjustment and management of vascular access device: Secondary | ICD-10-CM

## 2014-07-04 MED ORDER — SODIUM CHLORIDE 0.9 % IJ SOLN
10.0000 mL | INTRAMUSCULAR | Status: DC | PRN
  Administered 2014-07-04: 10 mL via INTRAVENOUS
  Filled 2014-07-04: qty 10

## 2014-07-04 MED ORDER — HEPARIN SOD (PORK) LOCK FLUSH 100 UNIT/ML IV SOLN
500.0000 [IU] | Freq: Once | INTRAVENOUS | Status: AC
  Administered 2014-07-04: 250 [IU] via INTRAVENOUS
  Filled 2014-07-04: qty 5

## 2014-07-04 NOTE — Patient Instructions (Signed)
PICC Home Guide A peripherally inserted central catheter (PICC) is a long, thin, flexible tube that is inserted into a vein in the upper arm. It is a form of intravenous (IV) access. It is considered to be a "central" line because the tip of the PICC ends in a large vein in your chest. This large vein is called the superior vena cava (SVC). The PICC tip ends in the SVC because there is a lot of blood flow in the SVC. This allows medicines and IV fluids to be quickly distributed throughout the body. The PICC is inserted using a sterile technique by a specially trained nurse or physician. After the PICC is inserted, a chest X-ray exam is done to be sure it is in the correct place.  A PICC may be placed for different reasons, such as:  To give medicines and liquid nutrition that can only be given through a central line. Examples are:  Certain antibiotic treatments.  Chemotherapy.  Total parenteral nutrition (TPN).  To take frequent blood samples.  To give IV fluids and blood products.  If there is difficulty placing a peripheral intravenous (PIV) catheter. If taken care of properly, a PICC can remain in place for several months. A PICC can also allow a person to go home from the hospital early. Medicine and PICC care can be managed at home by a family member or home health care team. WHAT PROBLEMS CAN HAPPEN WHEN I HAVE A PICC? Problems with a PICC can occasionally occur. These may include the following:  A blood clot (thrombus) forming in or at the tip of the PICC. This can cause the PICC to become clogged. A clot-dissolving medicine called tissue plasminogen activator (tPA) can be given through the PICC to help break up the clot.  Inflammation of the vein (phlebitis) in which the PICC is placed. Signs of inflammation may include redness, pain at the insertion site, red streaks, or being able to feel a "cord" in the vein where the PICC is located.  Infection in the PICC or at the insertion  site. Signs of infection may include fever, chills, redness, swelling, or pus drainage from the PICC insertion site.  PICC movement (malposition). The PICC tip may move from its original position due to excessive physical activity, forceful coughing, sneezing, or vomiting.  A break or cut in the PICC. It is important to not use scissors near the PICC.  Nerve or tendon irritation or injury during PICC insertion. WHAT SHOULD I KEEP IN MIND ABOUT ACTIVITIES WHEN I HAVE A PICC?  You may bend your arm and move it freely. If your PICC is near or at the bend of your elbow, avoid activity with repeated motion at the elbow.  Rest at home for the remainder of the day following PICC line insertion.  Avoid lifting heavy objects as instructed by your health care provider.  Avoid using a crutch with the arm on the same side as your PICC. You may need to use a walker. WHAT SHOULD I KNOW ABOUT MY PICC DRESSING?  Keep your PICC bandage (dressing) clean and dry to prevent infection.  Ask your health care provider when you may shower. Ask your health care provider to teach you how to wrap the PICC when you do take a shower.  Change the PICC dressing as instructed by your health care provider.  Change your PICC dressing if it becomes loose or wet. WHAT SHOULD I KNOW ABOUT PICC CARE?  Check the PICC insertion site   daily for leakage, redness, swelling, or pain.  Do not take a bath, swim, or use hot tubs when you have a PICC. Cover PICC line with clear plastic wrap and tape to keep it dry while showering.  Flush the PICC as directed by your health care provider. Let your health care provider know right away if the PICC is difficult to flush or does not flush. Do not use force to flush the PICC.  Do not use a syringe that is less than 10 mL to flush the PICC.  Never pull or tug on the PICC.  Avoid blood pressure checks on the arm with the PICC.  Keep your PICC identification card with you at all  times.  Do not take the PICC out yourself. Only a trained clinical professional should remove the PICC. SEEK IMMEDIATE MEDICAL CARE IF:  Your PICC is accidentally pulled all the way out. If this happens, cover the insertion site with a bandage or gauze dressing. Do not throw the PICC away. Your health care provider will need to inspect it.  Your PICC was tugged or pulled and has partially come out. Do not  push the PICC back in.  There is any type of drainage, redness, or swelling where the PICC enters the skin.  You cannot flush the PICC, it is difficult to flush, or the PICC leaks around the insertion site when it is flushed.  You hear a "flushing" sound when the PICC is flushed.  You have pain, discomfort, or numbness in your arm, shoulder, or jaw on the same side as the PICC.  You feel your heart "racing" or skipping beats.  You notice a hole or tear in the PICC.  You develop chills or a fever. MAKE SURE YOU:   Understand these instructions.  Will watch your condition.  Will get help right away if you are not doing well or get worse. Document Released: 12/13/2002 Document Revised: 10/23/2013 Document Reviewed: 02/13/2013 Exit[employer] Patient Information 2015 [employer]. This information is not intended to replace advice given to you by your health care provider. Make sure you discuss any questions you have with your health care provider.  

## 2014-07-11 ENCOUNTER — Telehealth: Payer: Self-pay | Admitting: *Deleted

## 2014-07-11 NOTE — Telephone Encounter (Signed)
Left VM for pt on her home and cell phone number to call nurse back if she develops any fevers, her symptoms worsen or any other questions or concerns.  Instructed she may take OTC cold/ congestion remedy as needed but please let us know if she feels any worse.

## 2014-07-11 NOTE — Telephone Encounter (Signed)
Pt called to TRIAGE stating " I was told to call if I develop any cold symptoms "  Pt states she has developed upper respiratory congestion with nasal drip and cough.  She denies any fever.  Drainage is clear.  Return call number given as [phone] or cell at [phone].  THIS NOTE WILL BE ROUTED TO MD AND RN AT DESK FOR FOLLOW AND RETURN CALL TO PT.

## 2014-07-12 ENCOUNTER — Ambulatory Visit (HOSPITAL_BASED_OUTPATIENT_CLINIC_OR_DEPARTMENT_OTHER): Payer: BLUE CROSS/BLUE SHIELD

## 2014-07-12 VITALS — BP 124/83 | HR 102

## 2014-07-12 DIAGNOSIS — C01 Malignant neoplasm of base of tongue: Secondary | ICD-10-CM

## 2014-07-12 DIAGNOSIS — Z452 Encounter for adjustment and management of vascular access device: Secondary | ICD-10-CM

## 2014-07-12 MED ORDER — SODIUM CHLORIDE 0.9 % IJ SOLN
10.0000 mL | INTRAMUSCULAR | Status: DC | PRN
  Administered 2014-07-12: 10 mL via INTRAVENOUS
  Filled 2014-07-12: qty 10

## 2014-07-12 MED ORDER — HEPARIN SOD (PORK) LOCK FLUSH 100 UNIT/ML IV SOLN
500.0000 [IU] | Freq: Once | INTRAVENOUS | Status: AC
  Administered 2014-07-12: 250 [IU] via INTRAVENOUS
  Filled 2014-07-12: qty 5

## 2014-07-12 NOTE — Patient Instructions (Signed)
PICC Home Guide A peripherally inserted central catheter (PICC) is a long, thin, flexible tube that is inserted into a vein in the upper arm. It is a form of intravenous (IV) access. It is considered to be a "central" line because the tip of the PICC ends in a large vein in your chest. This large vein is called the superior vena cava (SVC). The PICC tip ends in the SVC because there is a lot of blood flow in the SVC. This allows medicines and IV fluids to be quickly distributed throughout the body. The PICC is inserted using a sterile technique by a specially trained nurse or physician. After the PICC is inserted, a chest X-ray exam is done to be sure it is in the correct place.  A PICC may be placed for different reasons, such as:  To give medicines and liquid nutrition that can only be given through a central line. Examples are:  Certain antibiotic treatments.  Chemotherapy.  Total parenteral nutrition (TPN).  To take frequent blood samples.  To give IV fluids and blood products.  If there is difficulty placing a peripheral intravenous (PIV) catheter. If taken care of properly, a PICC can remain in place for several months. A PICC can also allow a person to go home from the hospital early. Medicine and PICC care can be managed at home by a family member or home health care team. WHAT PROBLEMS CAN HAPPEN WHEN I HAVE A PICC? Problems with a PICC can occasionally occur. These may include the following:  A blood clot (thrombus) forming in or at the tip of the PICC. This can cause the PICC to become clogged. A clot-dissolving medicine called tissue plasminogen activator (tPA) can be given through the PICC to help break up the clot.  Inflammation of the vein (phlebitis) in which the PICC is placed. Signs of inflammation may include redness, pain at the insertion site, red streaks, or being able to feel a "cord" in the vein where the PICC is located.  Infection in the PICC or at the insertion  site. Signs of infection may include fever, chills, redness, swelling, or pus drainage from the PICC insertion site.  PICC movement (malposition). The PICC tip may move from its original position due to excessive physical activity, forceful coughing, sneezing, or vomiting.  A break or cut in the PICC. It is important to not use scissors near the PICC.  Nerve or tendon irritation or injury during PICC insertion. WHAT SHOULD I KEEP IN MIND ABOUT ACTIVITIES WHEN I HAVE A PICC?  You may bend your arm and move it freely. If your PICC is near or at the bend of your elbow, avoid activity with repeated motion at the elbow.  Rest at home for the remainder of the day following PICC line insertion.  Avoid lifting heavy objects as instructed by your health care provider.  Avoid using a crutch with the arm on the same side as your PICC. You may need to use a walker. WHAT SHOULD I KNOW ABOUT MY PICC DRESSING?  Keep your PICC bandage (dressing) clean and dry to prevent infection.  Ask your health care provider when you may shower. Ask your health care provider to teach you how to wrap the PICC when you do take a shower.  Change the PICC dressing as instructed by your health care provider.  Change your PICC dressing if it becomes loose or wet. WHAT SHOULD I KNOW ABOUT PICC CARE?  Check the PICC insertion site   daily for leakage, redness, swelling, or pain.  Do not take a bath, swim, or use hot tubs when you have a PICC. Cover PICC line with clear plastic wrap and tape to keep it dry while showering.  Flush the PICC as directed by your health care provider. Let your health care provider know right away if the PICC is difficult to flush or does not flush. Do not use force to flush the PICC.  Do not use a syringe that is less than 10 mL to flush the PICC.  Never pull or tug on the PICC.  Avoid blood pressure checks on the arm with the PICC.  Keep your PICC identification card with you at all  times.  Do not take the PICC out yourself. Only a trained clinical professional should remove the PICC. SEEK IMMEDIATE MEDICAL CARE IF:  Your PICC is accidentally pulled all the way out. If this happens, cover the insertion site with a bandage or gauze dressing. Do not throw the PICC away. Your health care provider will need to inspect it.  Your PICC was tugged or pulled and has partially come out. Do not  push the PICC back in.  There is any type of drainage, redness, or swelling where the PICC enters the skin.  You cannot flush the PICC, it is difficult to flush, or the PICC leaks around the insertion site when it is flushed.  You hear a "flushing" sound when the PICC is flushed.  You have pain, discomfort, or numbness in your arm, shoulder, or jaw on the same side as the PICC.  You feel your heart "racing" or skipping beats.  You notice a hole or tear in the PICC.  You develop chills or a fever. MAKE SURE YOU:   Understand these instructions.  Will watch your condition.  Will get help right away if you are not doing well or get worse. Document Released: 12/13/2002 Document Revised: 10/23/2013 Document Reviewed: 02/13/2013 Exit[employer] Patient Information 2015 [employer]. This information is not intended to replace advice given to you by your health care provider. Make sure you discuss any questions you have with your health care provider.  

## 2014-07-13 NOTE — Telephone Encounter (Signed)
This is Dr. Calton Dach pt. Sorry that I did not see it until today.  Ni, could you let your nurse to follow on Monday?  Krista Blue

## 2014-07-16 ENCOUNTER — Ambulatory Visit: Payer: Self-pay

## 2014-07-16 ENCOUNTER — Ambulatory Visit: Payer: BLUE CROSS/BLUE SHIELD

## 2014-07-16 ENCOUNTER — Telehealth: Payer: Self-pay | Admitting: *Deleted

## 2014-07-16 NOTE — Telephone Encounter (Signed)
I have opening at 115pm. Does she wants to come in? No need labs. If yes let me know and I will add her on

## 2014-07-16 NOTE — Telephone Encounter (Signed)
S/w pt about her cold symptoms.  Informed her Dr. Alvy Bimler can see her today if her symptoms are not any better.  Pt scheduled to come in today for Speech therapy.  Pt states she is actually feeling a lot better and does not think she needs to be seen.  Says she cannot make it here today, in any case, due road conditions.  She cannot make it for ST today either.   She confirmed her appts for PET later this week on Thurs w/ office visit on Friday.    I called [facility] to notify them pt will not make her appt today and will need to r/s.

## 2014-07-18 ENCOUNTER — Telehealth: Payer: Self-pay | Admitting: Hematology and Oncology

## 2014-07-18 NOTE — Telephone Encounter (Signed)
returned pt call and confirmed appts....pt ok and aware °

## 2014-07-19 ENCOUNTER — Ambulatory Visit (HOSPITAL_BASED_OUTPATIENT_CLINIC_OR_DEPARTMENT_OTHER): Payer: BLUE CROSS/BLUE SHIELD

## 2014-07-19 ENCOUNTER — Ambulatory Visit (HOSPITAL_COMMUNITY)
Admission: RE | Admit: 2014-07-19 | Discharge: 2014-07-19 | Disposition: A | Discharge status: Home / Self Care | Payer: BLUE CROSS/BLUE SHIELD | Source: Ambulatory Visit | Attending: Hematology and Oncology | Admitting: Hematology and Oncology

## 2014-07-19 ENCOUNTER — Other Ambulatory Visit (HOSPITAL_BASED_OUTPATIENT_CLINIC_OR_DEPARTMENT_OTHER): Payer: BLUE CROSS/BLUE SHIELD

## 2014-07-19 DIAGNOSIS — D630 Anemia in neoplastic disease: Secondary | ICD-10-CM

## 2014-07-19 DIAGNOSIS — C01 Malignant neoplasm of base of tongue: Secondary | ICD-10-CM

## 2014-07-19 DIAGNOSIS — Z452 Encounter for adjustment and management of vascular access device: Secondary | ICD-10-CM

## 2014-07-19 LAB — COMPREHENSIVE METABOLIC PANEL (CC13)
ALT: 29 U/L (ref 0–55)
AST: 30 U/L (ref 5–34)
Albumin: 3.7 g/dL (ref 3.5–5.0)
Alkaline Phosphatase: 112 U/L (ref 40–150)
Anion Gap: 11 mEq/L (ref 3–11)
BILIRUBIN TOTAL: 0.44 mg/dL (ref 0.20–1.20)
BUN: 11.7 mg/dL (ref 7.0–26.0)
CALCIUM: 8.9 mg/dL (ref 8.4–10.4)
CO2: 23 mEq/L (ref 22–29)
Chloride: 107 mEq/L (ref 98–109)
Creatinine: 0.7 mg/dL (ref 0.6–1.1)
GLUCOSE: 151 mg/dL — AB (ref 70–140)
Potassium: 3.9 mEq/L (ref 3.5–5.1)
SODIUM: 140 meq/L (ref 136–145)
Total Protein: 6.7 g/dL (ref 6.4–8.3)

## 2014-07-19 LAB — CBC WITH DIFFERENTIAL/PLATELET
BASO%: 1.2 % (ref 0.0–2.0)
Basophils Absolute: 0.1 10*3/uL (ref 0.0–0.1)
EOS%: 12.7 % — AB (ref 0.0–7.0)
Eosinophils Absolute: 0.7 10*3/uL — ABNORMAL HIGH (ref 0.0–0.5)
HCT: 31.3 % — ABNORMAL LOW (ref 34.8–46.6)
HEMOGLOBIN: 10.2 g/dL — AB (ref 11.6–15.9)
LYMPH#: 1.2 10*3/uL (ref 0.9–3.3)
LYMPH%: 23.4 % (ref 14.0–49.7)
MCH: 31.9 pg (ref 25.1–34.0)
MCHC: 32.6 g/dL (ref 31.5–36.0)
MCV: 97.6 fL (ref 79.5–101.0)
MONO#: 0.3 10*3/uL (ref 0.1–0.9)
MONO%: 6.1 % (ref 0.0–14.0)
NEUT%: 56.6 % (ref 38.4–76.8)
NEUTROS ABS: 2.9 10*3/uL (ref 1.5–6.5)
Platelets: 205 10*3/uL (ref 145–400)
RBC: 3.2 10*6/uL — ABNORMAL LOW (ref 3.70–5.45)
RDW: 17.7 % — AB (ref 11.2–14.5)
WBC: 5.2 10*3/uL (ref 3.9–10.3)

## 2014-07-19 LAB — HOLD TUBE, BLOOD BANK

## 2014-07-19 LAB — GLUCOSE, CAPILLARY: Glucose-Capillary: 97 mg/dL (ref 70–99)

## 2014-07-19 MED ORDER — SODIUM CHLORIDE 0.9 % IJ SOLN
10.0000 mL | INTRAMUSCULAR | Status: DC | PRN
  Administered 2014-07-19: 10 mL via INTRAVENOUS
  Filled 2014-07-19: qty 10

## 2014-07-19 MED ORDER — FLUDEOXYGLUCOSE F - 18 (FDG) INJECTION
10.2000 | Freq: Once | INTRAVENOUS | Status: AC | PRN
  Administered 2014-07-19: 10.2 via INTRAVENOUS

## 2014-07-19 MED ORDER — HEPARIN SOD (PORK) LOCK FLUSH 100 UNIT/ML IV SOLN
500.0000 [IU] | Freq: Once | INTRAVENOUS | Status: AC
  Administered 2014-07-19: 500 [IU] via INTRAVENOUS
  Filled 2014-07-19: qty 5

## 2014-07-19 NOTE — Patient Instructions (Signed)
PICC Home Guide A peripherally inserted central catheter (PICC) is a long, thin, flexible tube that is inserted into a vein in the upper arm. It is a form of intravenous (IV) access. It is considered to be a "central" line because the tip of the PICC ends in a large vein in your chest. This large vein is called the superior vena cava (SVC). The PICC tip ends in the SVC because there is a lot of blood flow in the SVC. This allows medicines and IV fluids to be quickly distributed throughout the body. The PICC is inserted using a sterile technique by a specially trained nurse or physician. After the PICC is inserted, a chest X-ray exam is done to be sure it is in the correct place.  A PICC may be placed for different reasons, such as:  To give medicines and liquid nutrition that can only be given through a central line. Examples are:  Certain antibiotic treatments.  Chemotherapy.  Total parenteral nutrition (TPN).  To take frequent blood samples.  To give IV fluids and blood products.  If there is difficulty placing a peripheral intravenous (PIV) catheter. If taken care of properly, a PICC can remain in place for several months. A PICC can also allow a person to go home from the hospital early. Medicine and PICC care can be managed at home by a family member or home health care team. WHAT PROBLEMS CAN HAPPEN WHEN I HAVE A PICC? Problems with a PICC can occasionally occur. These may include the following:  A blood clot (thrombus) forming in or at the tip of the PICC. This can cause the PICC to become clogged. A clot-dissolving medicine called tissue plasminogen activator (tPA) can be given through the PICC to help break up the clot.  Inflammation of the vein (phlebitis) in which the PICC is placed. Signs of inflammation may include redness, pain at the insertion site, red streaks, or being able to feel a "cord" in the vein where the PICC is located.  Infection in the PICC or at the insertion  site. Signs of infection may include fever, chills, redness, swelling, or pus drainage from the PICC insertion site.  PICC movement (malposition). The PICC tip may move from its original position due to excessive physical activity, forceful coughing, sneezing, or vomiting.  A break or cut in the PICC. It is important to not use scissors near the PICC.  Nerve or tendon irritation or injury during PICC insertion. WHAT SHOULD I KEEP IN MIND ABOUT ACTIVITIES WHEN I HAVE A PICC?  You may bend your arm and move it freely. If your PICC is near or at the bend of your elbow, avoid activity with repeated motion at the elbow.  Rest at home for the remainder of the day following PICC line insertion.  Avoid lifting heavy objects as instructed by your health care provider.  Avoid using a crutch with the arm on the same side as your PICC. You may need to use a walker. WHAT SHOULD I KNOW ABOUT MY PICC DRESSING?  Keep your PICC bandage (dressing) clean and dry to prevent infection.  Ask your health care provider when you may shower. Ask your health care provider to teach you how to wrap the PICC when you do take a shower.  Change the PICC dressing as instructed by your health care provider.  Change your PICC dressing if it becomes loose or wet. WHAT SHOULD I KNOW ABOUT PICC CARE?  Check the PICC insertion site   daily for leakage, redness, swelling, or pain.  Do not take a bath, swim, or use hot tubs when you have a PICC. Cover PICC line with clear plastic wrap and tape to keep it dry while showering.  Flush the PICC as directed by your health care provider. Let your health care provider know right away if the PICC is difficult to flush or does not flush. Do not use force to flush the PICC.  Do not use a syringe that is less than 10 mL to flush the PICC.  Never pull or tug on the PICC.  Avoid blood pressure checks on the arm with the PICC.  Keep your PICC identification card with you at all  times.  Do not take the PICC out yourself. Only a trained clinical professional should remove the PICC. SEEK IMMEDIATE MEDICAL CARE IF:  Your PICC is accidentally pulled all the way out. If this happens, cover the insertion site with a bandage or gauze dressing. Do not throw the PICC away. Your health care provider will need to inspect it.  Your PICC was tugged or pulled and has partially come out. Do not  push the PICC back in.  There is any type of drainage, redness, or swelling where the PICC enters the skin.  You cannot flush the PICC, it is difficult to flush, or the PICC leaks around the insertion site when it is flushed.  You hear a "flushing" sound when the PICC is flushed.  You have pain, discomfort, or numbness in your arm, shoulder, or jaw on the same side as the PICC.  You feel your heart "racing" or skipping beats.  You notice a hole or tear in the PICC.  You develop chills or a fever. MAKE SURE YOU:   Understand these instructions.  Will watch your condition.  Will get help right away if you are not doing well or get worse. Document Released: 12/13/2002 Document Revised: 10/23/2013 Document Reviewed: 02/13/2013 Exit[employer] Patient Information 2015 [employer]. This information is not intended to replace advice given to you by your health care provider. Make sure you discuss any questions you have with your health care provider.  

## 2014-07-20 ENCOUNTER — Encounter: Payer: Self-pay | Admitting: *Deleted

## 2014-07-20 ENCOUNTER — Telehealth: Payer: Self-pay | Admitting: Hematology and Oncology

## 2014-07-20 ENCOUNTER — Encounter: Payer: Self-pay | Admitting: Hematology and Oncology

## 2014-07-20 ENCOUNTER — Ambulatory Visit (HOSPITAL_BASED_OUTPATIENT_CLINIC_OR_DEPARTMENT_OTHER): Payer: BLUE CROSS/BLUE SHIELD | Admitting: Hematology and Oncology

## 2014-07-20 VITALS — BP 122/56 | HR 92 | Temp 97.8°F | Resp 18 | Ht 67.0 in | Wt 209.3 lb

## 2014-07-20 DIAGNOSIS — E119 Type 2 diabetes mellitus without complications: Secondary | ICD-10-CM

## 2014-07-20 DIAGNOSIS — C01 Malignant neoplasm of base of tongue: Secondary | ICD-10-CM

## 2014-07-20 DIAGNOSIS — E041 Nontoxic single thyroid nodule: Secondary | ICD-10-CM | POA: Insufficient documentation

## 2014-07-20 DIAGNOSIS — Z95828 Presence of other vascular implants and grafts: Secondary | ICD-10-CM

## 2014-07-20 DIAGNOSIS — D630 Anemia in neoplastic disease: Secondary | ICD-10-CM

## 2014-07-20 NOTE — Assessment & Plan Note (Signed)
She has excellent response to treatment. PET/CT scan showed no evidence of residual disease. We will get her case presented at the next ENT tumor board next week. The patient would need consolidation treatment with radiation therapy. I recommend she keeps her PICC line until her radiation treatment is completed.

## 2014-07-20 NOTE — Progress Notes (Signed)
[facility] OFFICE PROGRESS NOTE  Patient Care Team: Gaynelle Arabian, MD as PCP - General (Family Medicine) Brooks Sailors, RN as Oncology Nurse [place], RD as Dietitian (Nutrition) Eppie Gibson, MD as Attending Physician (Radiation Oncology)  SUMMARY OF ONCOLOGIC HISTORY: Oncology History   Cancer of base of tongue   Staging form: Lip and Oral Cavity, AJCC 7th Edition     Clinical: Stage IVA (T3, N2b, M0) - Signed by Heath Lark, MD on 05/09/2014       Cancer of base of tongue   04/20/2014 Imaging CT neck showed advanced base of tongue mass and multiple right neck lymphadenopathy   04/23/2014 Pathology Results [id number] FNA of right neck is positive for squamous cell cancer, P16 positive   05/09/2014 Imaging PET/CT scan confirmed base of tongue cancer along with lymph node metastasis.   05/10/2014 Procedure She has placement of PICC line.   05/11/2014 - 06/26/2014 Chemotherapy She received 3 cycles of induction chemotherapy with Taxotere, cisplatin and 5-FU.   05/19/2014 - 05/22/2014 Hospital Admission She was admitted to the hospital for management of neutropenic fever, GI bleed requiring blood transfusion and diarrhea from chemotherapy   07/19/2014 Imaging Repeat PET/CT scan showed complete response to treatment    INTERVAL HISTORY: Please see below for problem oriented charting. She is seen today to review test results. She have completed cycle 3 of therapy without major side effects. Recently, she has some mild upper respiratory tract infection with low-grade fever which subsequently resolved. She denies recent mucositis, nausea or vomiting.  REVIEW OF SYSTEMS:   Constitutional: Denies fevers, chills or abnormal weight loss Eyes: Denies blurriness of vision Ears, nose, mouth, throat, and face: Denies mucositis or sore throat Respiratory: Denies cough, dyspnea or wheezes Cardiovascular: Denies palpitation, chest discomfort or lower extremity  swelling Gastrointestinal:  Denies nausea, heartburn or change in bowel habits Skin: Denies abnormal skin rashes Lymphatics: Denies new lymphadenopathy or easy bruising Neurological:Denies numbness, tingling or new weaknesses Behavioral/Psych: Mood is stable, no new changes  All other systems were reviewed with the patient and are negative.  I have reviewed the past medical history, past surgical history, social history and family history with the patient and they are unchanged from previous note.  ALLERGIES:  is allergic to penicillins.  MEDICATIONS:  Current Outpatient Prescriptions  Medication Sig Dispense Refill  . ALPRAZolam (XANAX) 0.25 MG tablet Take 0.25 mg by mouth as needed for anxiety.    Marland Kitchen amLODipine (NORVASC) 10 MG tablet Take 10 mg by mouth daily.    Marland Kitchen atorvastatin (LIPITOR) 10 MG tablet Take 10 mg by mouth daily.    . metFORMIN (GLUCOPHAGE) 500 MG tablet Take 500 mg by mouth daily with breakfast.    . Prenatal Vit-Fe Fumarate-FA (MULTIVITAMIN-PRENATAL) 27-0.8 MG TABS tablet Take 1 tablet by mouth daily at 12 noon.    . Sodium Chloride Flush 0.9 % SOLN injection Inject 10 mLs into the vein daily. 60 Syringe 3  . sodium fluoride (FLUORISHIELD) 1.1 % GEL dental gel Instill one drop of gel per tooth space of fluoride tray. Place over teeth for 5 minutes. Remove. Spit out excess. Repeat nightly. 120 mL prn  . Alum & Mag Hydroxide-Simeth (MAGIC MOUTHWASH) SOLN Take 5 mLs by mouth 4 (four) times daily as needed for mouth pain. (Patient not taking: Reported on 07/20/2014) 500 mL 0  . ondansetron (ZOFRAN) 8 MG tablet Take 1 tablet (8 mg total) by mouth every 8 (eight) hours as needed (Nausea  or vomiting). (Patient not taking: Reported on 07/20/2014) 90 tablet 1  . prochlorperazine (COMPAZINE) 10 MG tablet Take 1 tablet (10 mg total) by mouth every 6 (six) hours as needed (Nausea or vomiting). (Patient not taking: Reported on 07/20/2014) 30 tablet 1   No current facility-administered  medications for this visit.    PHYSICAL EXAMINATION: ECOG PERFORMANCE STATUS: 0 - Asymptomatic  Filed Vitals:   07/20/14 0853  BP: 122/56  Pulse: 92  Temp: 97.8 F (36.6 C)  Resp: 18   Filed Weights   07/20/14 0853  Weight: 209 lb 4.8 oz (94.938 kg)    GENERAL:alert, no distress and comfortable SKIN: skin color, texture, turgor are normal, no rashes or significant lesions EYES: normal, Conjunctiva are pink and non-injected, sclera clear OROPHARYNX:no exudate, no erythema and lips, buccal mucosa, and tongue normal  NECK: supple, thyroid normal size, non-tender, without nodularity LYMPH:  no palpable lymphadenopathy in the cervical, axillary or inguinal LUNGS: clear to auscultation and percussion with normal breathing effort HEART: regular rate & rhythm and no murmurs and no lower extremity edema ABDOMEN:abdomen soft, non-tender and normal bowel sounds Musculoskeletal:no cyanosis of digits and no clubbing  NEURO: alert & oriented x 3 with fluent speech, no focal motor/sensory deficits  LABORATORY DATA:  I have reviewed the data as listed    Component Value Date/Time   NA 140 07/19/2014 1016   NA 138 05/22/2014 0445   K 3.9 07/19/2014 1016   K 3.0* 05/22/2014 0445   CL 101 05/22/2014 0445   CO2 23 07/19/2014 1016   CO2 26 05/22/2014 0445   GLUCOSE 151* 07/19/2014 1016   GLUCOSE 144* 05/22/2014 0445   BUN 11.7 07/19/2014 1016   BUN 5* 05/22/2014 0445   CREATININE 0.7 07/19/2014 1016   CREATININE 0.66 05/22/2014 0445   CALCIUM 8.9 07/19/2014 1016   CALCIUM 8.1* 05/22/2014 0445   PROT 6.7 07/19/2014 1016   PROT 5.9* 05/20/2014 0600   ALBUMIN 3.7 07/19/2014 1016   ALBUMIN 2.5* 05/20/2014 0600   AST 30 07/19/2014 1016   AST 10 05/20/2014 0600   ALT 29 07/19/2014 1016   ALT 17 05/20/2014 0600   ALKPHOS 112 07/19/2014 1016   ALKPHOS 67 05/20/2014 0600   BILITOT 0.44 07/19/2014 1016   BILITOT 0.3 05/20/2014 0600   GFRNONAA >90 05/22/2014 0445   GFRAA >90 05/22/2014  0445    No results found for: SPEP, UPEP  Lab Results  Component Value Date   WBC 5.2 07/19/2014   NEUTROABS 2.9 07/19/2014   HGB 10.2* 07/19/2014   HCT 31.3* 07/19/2014   MCV 97.6 07/19/2014   PLT 205 07/19/2014      Chemistry      Component Value Date/Time   NA 140 07/19/2014 1016   NA 138 05/22/2014 0445   K 3.9 07/19/2014 1016   K 3.0* 05/22/2014 0445   CL 101 05/22/2014 0445   CO2 23 07/19/2014 1016   CO2 26 05/22/2014 0445   BUN 11.7 07/19/2014 1016   BUN 5* 05/22/2014 0445   CREATININE 0.7 07/19/2014 1016   CREATININE 0.66 05/22/2014 0445      Component Value Date/Time   CALCIUM 8.9 07/19/2014 1016   CALCIUM 8.1* 05/22/2014 0445   ALKPHOS 112 07/19/2014 1016   ALKPHOS 67 05/20/2014 0600   AST 30 07/19/2014 1016   AST 10 05/20/2014 0600   ALT 29 07/19/2014 1016   ALT 17 05/20/2014 0600   BILITOT 0.44 07/19/2014 1016   BILITOT 0.3 05/20/2014 0600  RADIOGRAPHIC STUDIES: I reviewed the imaging study with the patient I have personally reviewed the radiological images as listed and agreed with the findings in the report. Nm Pet Image Restag (ps) Skull Base To Thigh  07/19/2014   CLINICAL DATA:  Subsequent treatment strategy for tongue cancer .  EXAM: NUCLEAR MEDICINE PET SKULL BASE TO THIGH  TECHNIQUE: 10.2 mCi F-18 FDG was injected intravenously. Full-ring PET imaging was performed from the skull base to thigh after the radiotracer. CT data was obtained and used for attenuation correction and anatomic localization.  FASTING BLOOD GLUCOSE:  Value: 97 mg/dl  COMPARISON:  None.  FINDINGS: NECK  Resolution of intense FDG uptake associated with the base of tongue mass. SUV max within the base of tongue is equal to 2.6. This is compared with 13.5 cm previously. Right posterior cervical lymph node measures 1 cm and has an SUV max equal to 1.8. Previously this node measured 2 cm and had an SUV max equal to 7.4. Hypermetabolic nodule within the right lobe of thyroid  gland measures approximately 1.3 cm and has an SUV max equal to 7.8.  CHEST  No hypermetabolic mediastinal or hilar nodes. No suspicious pulmonary nodules on the CT scan.  ABDOMEN/PELVIS  No abnormal hypermetabolic activity within the liver, pancreas, adrenal glands, or spleen. Stone identified within the gallbladder. Soft tissue stranding is identified within the mesenteric compatible with a missed the mesenteric. A few prominent lymph nodes are identified within this area which appear similar to previous exam. No definitive evidence for metastatic adenopathy to the abdomen or pelvis. No hypermetabolic lymph nodes in the abdomen or pelvis.  SKELETON  No focal hypermetabolic activity to suggest skeletal metastasis.  IMPRESSION: 1. Interval response to therapy. Specifically, there has been resolution of malignant range FDG uptake associated with base of tongue mass and cervical lymph nodes. 2. Right posterior cervical lymph node remains borderline enlarged measuring 1 cm. There is no malignant range FDG uptake associated with this lymph node. 3. Thyroid nodule exhibiting increased FDG uptake. Hypermetabolic thyroid nodules on PET have up to 40-50% incidence of malignancy; recommend further evaluation with thyroid ultrasound and possible US-guided fine needle aspiration.   Electronically Signed   By: Kerby Moors M.D.   On: 07/19/2014 10:35     ASSESSMENT & PLAN:  Cancer of base of tongue She has excellent response to treatment. PET/CT scan showed no evidence of residual disease. We will get her case presented at the next ENT tumor board next week. The patient would need consolidation treatment with radiation therapy. I recommend she keeps her PICC line until her radiation treatment is completed.   Anemia in neoplastic disease This is likely due to recent treatment. The patient denies recent history of bleeding such as epistaxis, hematuria or hematochezia. She is asymptomatic from the anemia. I will  observe for now.   S/P PICC central line placement She will continue PICC line flushes at home and PICC line dressing change every week. Clinically, it appears to be working well.    Diabetes mellitus type 2, controlled, without complications She will monitor her blood sugar carefully. If her blood sugar is consistently over 200, she may need dose adjustment to metformin.   Thyroid nodule She has a thyroid nodule that was evaluated with ultrasound in November with benign findings. Continue close observation.       All questions were answered. The patient knows to call the clinic with any problems, questions or concerns. No barriers to learning was  detected. I spent 30 minutes counseling the patient face to face. The total time spent in the appointment was 40 minutes and more than 50% was on counseling and review of test results     [facility], [place], MD 07/20/2014 10:43 AM

## 2014-07-20 NOTE — Telephone Encounter (Signed)
gave pt avs report and appts for feb.

## 2014-07-20 NOTE — Assessment & Plan Note (Signed)
This is likely due to recent treatment. The patient denies recent history of bleeding such as epistaxis, hematuria or hematochezia. She is asymptomatic from the anemia. I will observe for now.   

## 2014-07-20 NOTE — Assessment & Plan Note (Signed)
She will continue PICC line flushes at home and PICC line dressing change every week. Clinically, it appears to be working well.

## 2014-07-20 NOTE — Assessment & Plan Note (Signed)
She will monitor her blood sugar carefully. If her blood sugar is consistently over 200, she may need dose adjustment to metformin.

## 2014-07-20 NOTE — Assessment & Plan Note (Signed)
She has a thyroid nodule that was evaluated with ultrasound in November with benign findings. Continue close observation.

## 2014-07-20 NOTE — Progress Notes (Signed)
To provide support and encouragement, care continuity and to assess for needs, met with patient during scheduled appt with Dr. Alvy Bimler.  She verbalized understanding of discussion of 07/19/14 PET results presented by Dr. Alvy Bimler.  She understands that next step in her tmt will be follow-up with Dr. Isidore Moos and CT SIM.  I answered her questions about RT tmt, SEs, timeframe of healing. She understands she can contact me with questions/concerns.  [name], RN, BSN, [facility] at [place] [phone]

## 2014-07-23 ENCOUNTER — Ambulatory Visit: Payer: BLUE CROSS/BLUE SHIELD | Attending: Radiation Oncology

## 2014-07-23 ENCOUNTER — Ambulatory Visit: Payer: Self-pay

## 2014-07-23 DIAGNOSIS — R1310 Dysphagia, unspecified: Secondary | ICD-10-CM

## 2014-07-23 DIAGNOSIS — M436 Torticollis: Secondary | ICD-10-CM | POA: Insufficient documentation

## 2014-07-23 DIAGNOSIS — Z5189 Encounter for other specified aftercare: Secondary | ICD-10-CM | POA: Insufficient documentation

## 2014-07-23 NOTE — Therapy (Signed)
[facility] [address] [place], [place], [address] Phone: [phone]   Fax:  [phone]  Speech Language Pathology Treatment  Patient Details  Name: [name] MRN: [id number] Date of Birth: 12/09/1955 Referring Provider:  Gaynelle Arabian, MD  Encounter Date: 07/23/2014      End of Session - 07/23/14 1443    Visit Number 2   Number of Visits 6   Date for SLP Re-Evaluation 01/05/15  due to first f/u 07-23-14   SLP Start Time 54   SLP Stop Time  1440   SLP Time Calculation (min) 31 min   Activity Tolerance Patient tolerated treatment well      Past Medical History  Diagnosis Date  . Hypertension   . Diabetes mellitus without complication     "borderline diabetic"   . Hypercholesteremia   . Diabetes mellitus type 2, controlled, without complications 76/54/6503  . Cancer of tongue   . Anemia in neoplastic disease 06/01/2014  . GERD (gastroesophageal reflux disease) 06/08/2014    Past Surgical History  Procedure Laterality Date  . Cervical ablation      Uterus  . Nasal fracture surgery  5465    Dr. Erik Obey    There were no vitals taken for this visit.  Visit Diagnosis: Dysphagia      Subjective Assessment - 07/23/14 1422    Symptoms Pt without difficulty with swallowing/mealtimes             ADULT SLP TREATMENT - 07/23/14 1423    General Information   HPI Pt done with chemo and will undergo radiation in the next 1-2 weeks.   Treatment Provided   Treatment provided Dysphagia   Dysphagia Treatment   Treatment Methods Therapeutic exercise;Skilled observation   Patient observed directly with PO's Yes   Type of PO's observed Dysphagia 1 (puree);Thin liquids   Other treatment/comments Completed HEP with usual min A (masako, breath hold, chin resist, mendelsohn). POs observed without overt s/s aspiration. Pt had questions re: incidence of rad tx following chemo as well as other questions  related to rad tx, which SLP referred pt to rad onc MD.   Pain Assessment   Pain Assessment No/denies pain   Assessment / Recommendations / Plan   Plan Continue with current plan of care   Dysphagia Recommendations   Diet recommendations Regular;Thin liquid   Progression Toward Goals   Progression toward goals Progressing toward goals          SLP Education - 07/23/14 1443    Education provided Yes   Education Details HEP   Person(s) Educated Patient   Methods Explanation;Demonstration   Comprehension Verbalized understanding;Returned demonstration          SLP Short Term Goals - 07/23/14 1445    SLP SHORT TERM GOAL #1   Title pt will complete HEP with min A   Time 2   Period --  therapy visits   Status Revised   SLP SHORT TERM GOAL #2   Title pt will tell SLP why she is completing HEP   Time 2   Period --  therapy visits   Status On-going          SLP Long Term Goals - 07/23/14 1447    SLP LONG TERM GOAL #1   Title pt will tell SLP why food journal can be helpful for return to full PO diet following head/neck radiation   Time 4   Period --  therapy visits  Status On-going   SLP LONG TERM GOAL #2   Title pt will complete HEP with modified assistance over 4 sessions in order to maximize opportunity for WNL swallowing over time   Time 4   Period --  thearpy visits   Status On-going   SLP LONG TERM GOAL #3   Title pt will tell SLP 3 s/s/ aspiration PNA to minimize pt risk for pulmonary complications following head/neck radiation   Time 4   Period --  therapy visits   Status On-going          Plan - 07/23/14 1444    Clinical Impression Statement Pt rquires cont skilled ST to assess ability to carryover correct procedure of HEP to home as well as to assess safety with POs. Pt req'd usual min A with HEP today.   Speech Therapy Frequency Monthly   Duration --  5 more visits   Treatment/Interventions Pharyngeal strengthening exercises;Oral motor  exercises;SLP instruction and feedback;Patient/family education;Trials of upgraded texture/liquids   Potential to Achieve Goals Good        Problem List Patient Active Problem List   Diagnosis Date Noted  . Thyroid nodule 07/20/2014  . Neuropathy due to chemotherapeutic drug 06/21/2014  . Other constipation 06/21/2014  . GERD (gastroesophageal reflux disease) 06/08/2014  . Leukopenia due to antineoplastic chemotherapy 06/08/2014  . Mucositis due to chemotherapy 06/08/2014  . Anemia in neoplastic disease 06/01/2014  . Diarrhea 05/23/2014  . Hypertension 05/19/2014  . S/P PICC central line placement 05/16/2014  . Cancer of base of tongue 05/09/2014  . Diabetes mellitus type 2, controlled, without complications 01/65/5374    [facility] SLP 07/23/2014, 2:48 PM  [facility] [address] [place], [place], [address] Phone: [phone]   Fax:  [phone]

## 2014-07-23 NOTE — Patient Instructions (Signed)
Begin exercises twice daily on your first day of radiation  Continue once/day until then

## 2014-07-26 ENCOUNTER — Encounter: Payer: Self-pay | Admitting: Radiation Oncology

## 2014-07-26 ENCOUNTER — Ambulatory Visit (HOSPITAL_BASED_OUTPATIENT_CLINIC_OR_DEPARTMENT_OTHER): Payer: BLUE CROSS/BLUE SHIELD

## 2014-07-26 VITALS — BP 129/64 | HR 89 | Temp 97.8°F

## 2014-07-26 DIAGNOSIS — C01 Malignant neoplasm of base of tongue: Secondary | ICD-10-CM

## 2014-07-26 DIAGNOSIS — Z452 Encounter for adjustment and management of vascular access device: Secondary | ICD-10-CM

## 2014-07-26 MED ORDER — HEPARIN SOD (PORK) LOCK FLUSH 100 UNIT/ML IV SOLN
500.0000 [IU] | Freq: Once | INTRAVENOUS | Status: AC
  Administered 2014-07-26: 250 [IU] via INTRAVENOUS
  Filled 2014-07-26: qty 5

## 2014-07-26 MED ORDER — SODIUM CHLORIDE 0.9 % IJ SOLN
10.0000 mL | INTRAMUSCULAR | Status: DC | PRN
  Administered 2014-07-26: 10 mL via INTRAVENOUS
  Filled 2014-07-26: qty 10

## 2014-07-26 NOTE — Patient Instructions (Signed)
PICC Home Guide A peripherally inserted central catheter (PICC) is a long, thin, flexible tube that is inserted into a vein in the upper arm. It is a form of intravenous (IV) access. It is considered to be a "central" line because the tip of the PICC ends in a large vein in your chest. This large vein is called the superior vena cava (SVC). The PICC tip ends in the SVC because there is a lot of blood flow in the SVC. This allows medicines and IV fluids to be quickly distributed throughout the body. The PICC is inserted using a sterile technique by a specially trained nurse or physician. After the PICC is inserted, a chest X-ray exam is done to be sure it is in the correct place.  A PICC may be placed for different reasons, such as:  To give medicines and liquid nutrition that can only be given through a central line. Examples are:  Certain antibiotic treatments.  Chemotherapy.  Total parenteral nutrition (TPN).  To take frequent blood samples.  To give IV fluids and blood products.  If there is difficulty placing a peripheral intravenous (PIV) catheter. If taken care of properly, a PICC can remain in place for several months. A PICC can also allow a person to go home from the hospital early. Medicine and PICC care can be managed at home by a family member or home health care team. WHAT PROBLEMS CAN HAPPEN WHEN I HAVE A PICC? Problems with a PICC can occasionally occur. These may include the following:  A blood clot (thrombus) forming in or at the tip of the PICC. This can cause the PICC to become clogged. A clot-dissolving medicine called tissue plasminogen activator (tPA) can be given through the PICC to help break up the clot.  Inflammation of the vein (phlebitis) in which the PICC is placed. Signs of inflammation may include redness, pain at the insertion site, red streaks, or being able to feel a "cord" in the vein where the PICC is located.  Infection in the PICC or at the insertion  site. Signs of infection may include fever, chills, redness, swelling, or pus drainage from the PICC insertion site.  PICC movement (malposition). The PICC tip may move from its original position due to excessive physical activity, forceful coughing, sneezing, or vomiting.  A break or cut in the PICC. It is important to not use scissors near the PICC.  Nerve or tendon irritation or injury during PICC insertion. WHAT SHOULD I KEEP IN MIND ABOUT ACTIVITIES WHEN I HAVE A PICC?  You may bend your arm and move it freely. If your PICC is near or at the bend of your elbow, avoid activity with repeated motion at the elbow.  Rest at home for the remainder of the day following PICC line insertion.  Avoid lifting heavy objects as instructed by your health care provider.  Avoid using a crutch with the arm on the same side as your PICC. You may need to use a walker. WHAT SHOULD I KNOW ABOUT MY PICC DRESSING?  Keep your PICC bandage (dressing) clean and dry to prevent infection.  Ask your health care provider when you may shower. Ask your health care provider to teach you how to wrap the PICC when you do take a shower.  Change the PICC dressing as instructed by your health care provider.  Change your PICC dressing if it becomes loose or wet. WHAT SHOULD I KNOW ABOUT PICC CARE?  Check the PICC insertion site   daily for leakage, redness, swelling, or pain.  Do not take a bath, swim, or use hot tubs when you have a PICC. Cover PICC line with clear plastic wrap and tape to keep it dry while showering.  Flush the PICC as directed by your health care provider. Let your health care provider know right away if the PICC is difficult to flush or does not flush. Do not use force to flush the PICC.  Do not use a syringe that is less than 10 mL to flush the PICC.  Never pull or tug on the PICC.  Avoid blood pressure checks on the arm with the PICC.  Keep your PICC identification card with you at all  times.  Do not take the PICC out yourself. Only a trained clinical professional should remove the PICC. SEEK IMMEDIATE MEDICAL CARE IF:  Your PICC is accidentally pulled all the way out. If this happens, cover the insertion site with a bandage or gauze dressing. Do not throw the PICC away. Your health care provider will need to inspect it.  Your PICC was tugged or pulled and has partially come out. Do not  push the PICC back in.  There is any type of drainage, redness, or swelling where the PICC enters the skin.  You cannot flush the PICC, it is difficult to flush, or the PICC leaks around the insertion site when it is flushed.  You hear a "flushing" sound when the PICC is flushed.  You have pain, discomfort, or numbness in your arm, shoulder, or jaw on the same side as the PICC.  You feel your heart "racing" or skipping beats.  You notice a hole or tear in the PICC.  You develop chills or a fever. MAKE SURE YOU:   Understand these instructions.  Will watch your condition.  Will get help right away if you are not doing well or get worse. Document Released: 12/13/2002 Document Revised: 10/23/2013 Document Reviewed: 02/13/2013 Exit[employer] Patient Information 2015 [employer]. This information is not intended to replace advice given to you by your health care provider. Make sure you discuss any questions you have with your health care provider.  

## 2014-07-26 NOTE — Progress Notes (Signed)
  Head and Neck Cancer Location of Tumor / Histology: LYMPHOMA RIGHT BASE OF TONGUE. LEVEL 3 LYMPH NODE  Ms. Chryl Holten. Leonhart presented on 04/23/14 to [facility]uit[facility] with a 6 week history of report of feeling "like something was in her throat", and that she noted a lump in her neck.  6 weeks prior to this visit, she was seen by Dr. Marisue Humble and was given Sudafed and a topical Anesthetic. After 4-6 weeks, her symptoms had worsened and she was treated with a course of Zithromax with minimal effect. Within a 2 week period she noted the lump in her mid right neck. Described as having a slight potatoe, raspy quality to her voice. Strong Gag Reflex.  CT scan revealed a Large right base of tongue mass and several level II and III suspicious lymph nodes, and a 1.8 cm right thyroid nodule "lump" behind her tongue, exophytic mass right base of tongue and several lymph nodes. Dr. Redmond Baseman performed a needle aspiration biopsy on this visit.  Biopsies of LYMPH NODE (if applicable) revealed:  83/2/54 Diagnosis LYMPH NODE, FINE NEEDLE ASPIRATION LEVEL 3 NODE (SPECIMEN 1 OF 1 COLLECTED ON 04/23/2014) MALIGNANT CELLS PRESENT SEE COMMENT THERE ARE MALIGNANT CELLS PRESENT, CONSISTENT WITH POORLY DIFFERENTIATED SQUAMOUS CELL CARCINOMA. PER CLINICIAN REQUEST, A P16 IMMUNOHISTOCHEMICAL STAIN  P16 positive  Nutrition Status:  Weight changes: slight weight gain  Swallowing status: normal  Plans, if any, for PEG tube: no  Tobacco/Marijuana/Snuff/ETOH use: smoked 1/2 ppd x 5 years, Quit in 1976, Occasional wine, No Illicit Drug Use  Past/Anticipated interventions by otolaryngology, if any: Dr. Melida Quitter: biopsy  Past/Anticipated interventions by medical oncology, if any: Dr.Ni Gorsuch -  05/11/2014 - 06/26/2014 Chemotherapy She received 3 cycles of induction chemotherapy with Taxotere, cisplatin and 5-FU.   Referrals yet, to any of the following?  Social Work? has seen in past  Dentistry? Dr.  Teena Dunk - last seen 05/09/14, to FU with regular dentist  Swallowing therapy? last seen 07/23/14  Nutrition? Dory Peru - last seen 06/26/14  Med/Onc? Dr. Heath Lark   PEG placement? na  SAFETY ISSUES:  Prior radiation? No  Pacemaker/ICD? No  Possible current pregnancy? No  Is the patient on methotrexate? No  Current Complaints / other details: 07/19/14 repeat PET scan showed complete response to tx Married, 1 son, 1 grand daughter. Denies pain, difficulty eating, swallowing, states taste buds returning. "very claustrophobic"  Takes Xanax prn

## 2014-07-27 ENCOUNTER — Other Ambulatory Visit: Payer: Self-pay | Admitting: Radiation Oncology

## 2014-07-27 ENCOUNTER — Encounter: Payer: Self-pay | Admitting: Radiation Oncology

## 2014-07-27 ENCOUNTER — Ambulatory Visit
Admission: RE | Admit: 2014-07-27 | Discharge: 2014-07-27 | Disposition: A | Discharge status: Home / Self Care | Payer: BLUE CROSS/BLUE SHIELD | Source: Ambulatory Visit | Attending: Radiation Oncology | Admitting: Radiation Oncology

## 2014-07-27 ENCOUNTER — Telehealth: Payer: Self-pay | Admitting: *Deleted

## 2014-07-27 ENCOUNTER — Encounter: Payer: Self-pay | Admitting: *Deleted

## 2014-07-27 VITALS — BP 125/70 | HR 85 | Temp 97.8°F | Resp 20 | Ht 67.0 in | Wt 206.8 lb

## 2014-07-27 DIAGNOSIS — L599 Disorder of the skin and subcutaneous tissue related to radiation, unspecified: Secondary | ICD-10-CM | POA: Diagnosis not present

## 2014-07-27 DIAGNOSIS — E780 Pure hypercholesterolemia: Secondary | ICD-10-CM | POA: Insufficient documentation

## 2014-07-27 DIAGNOSIS — Z7982 Long term (current) use of aspirin: Secondary | ICD-10-CM | POA: Diagnosis not present

## 2014-07-27 DIAGNOSIS — Z510 Encounter for antineoplastic radiation therapy: Secondary | ICD-10-CM | POA: Diagnosis present

## 2014-07-27 DIAGNOSIS — C01 Malignant neoplasm of base of tongue: Secondary | ICD-10-CM

## 2014-07-27 DIAGNOSIS — Z79891 Long term (current) use of opiate analgesic: Secondary | ICD-10-CM | POA: Diagnosis not present

## 2014-07-27 DIAGNOSIS — L539 Erythematous condition, unspecified: Secondary | ICD-10-CM | POA: Insufficient documentation

## 2014-07-27 DIAGNOSIS — B370 Candidal stomatitis: Secondary | ICD-10-CM | POA: Diagnosis not present

## 2014-07-27 DIAGNOSIS — Z9221 Personal history of antineoplastic chemotherapy: Secondary | ICD-10-CM | POA: Insufficient documentation

## 2014-07-27 DIAGNOSIS — Z87891 Personal history of nicotine dependence: Secondary | ICD-10-CM | POA: Diagnosis not present

## 2014-07-27 DIAGNOSIS — L814 Other melanin hyperpigmentation: Secondary | ICD-10-CM | POA: Diagnosis not present

## 2014-07-27 DIAGNOSIS — E119 Type 2 diabetes mellitus without complications: Secondary | ICD-10-CM | POA: Insufficient documentation

## 2014-07-27 DIAGNOSIS — I10 Essential (primary) hypertension: Secondary | ICD-10-CM | POA: Diagnosis not present

## 2014-07-27 HISTORY — DX: Malignant (primary) neoplasm, unspecified: C80.1

## 2014-07-27 NOTE — Telephone Encounter (Signed)
CALLED PATIENT TO INFORM OF LAB FOR 07-30-14 @ 10 AM, SPOKE WITH PATIENT AND SHE IS AWARE OF THIS APPT.

## 2014-07-27 NOTE — Progress Notes (Signed)
Radiation Oncology         [phone] ________________________________  Outpatient Re-Consultation  Name: [name] MRN: [id number]  Date: 07/27/2014  DOB: [birth year]  NW:GNFAOZH,YQMVHQ R, MD  Jodi Marble, MD   REFERRING PHYSICIAN: Jodi Marble, MD  DIAGNOSIS:    ICD-9-CM ICD-10-CM   1. 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 female who was recently discussed at tumor board following completion of induction chemotherapy; her PET shows an impressive (complete) metabolic response.  She has been released by med/onc for radiation planning.  Past/Anticipated interventions by medical oncology, if any: Dr.Ni Gorsuch -  05/11/2014 - 06/26/2014 Chemotherapy She received 3 cycles of induction chemotherapy with Taxotere, cisplatin and 5-FU.   Referrals yet, to any of the following?  Social Work? has seen in past  Dentistry? Dr. Teena Dunk - last seen 05/09/14, to FU with regular dentist  Swallowing therapy? last seen 07/23/14  Nutrition? Dory Peru - last seen 06/26/14  Med/Onc? Dr. Heath Lark   PEG placement? na  SAFETY ISSUES:  Prior radiation? No  Pacemaker/ICD? No  Possible current pregnancy? No  Is the patient on methotrexate? No  Denies pain, difficulty eating, swallowing, states taste buds returning.   PREVIOUS RADIATION THERAPY: No  PAST MEDICAL HISTORY:  has a past medical history of Hypertension; Diabetes mellitus without complication; Hypercholesteremia; Diabetes mellitus type 2, controlled, without complications (46/96/2952); Cancer of tongue; Anemia in neoplastic disease (06/01/2014); GERD (gastroesophageal reflux disease) (06/08/2014); and Cancer.    PAST SURGICAL HISTORY: Past Surgical History  Procedure Laterality Date  . Cervical ablation      Uterus  . Nasal fracture  surgery  8413    Dr. Erik Obey    FAMILY HISTORY: family history includes Cancer in her brother, paternal grandfather, and sister; Heart failure in her father; Hepatitis C in her mother.  SOCIAL HISTORY:  reports that she quit smoking about 40 years ago. Her smoking use included Cigarettes. She has a 2.5 pack-year smoking history. She has never used smokeless tobacco. She reports that she drinks alcohol. She reports that she does not use illicit drugs.  ALLERGIES: Penicillins  MEDICATIONS:  Current Outpatient Prescriptions  Medication Sig Dispense Refill  . ALPRAZolam (XANAX) 0.25 MG tablet Take 0.25 mg by mouth as needed for anxiety.    . Alum & Mag Hydroxide-Simeth (MAGIC MOUTHWASH) SOLN Take 5 mLs by mouth 4 (four) times daily as needed for mouth pain. 500 mL 0  . amLODipine (NORVASC) 10 MG tablet Take 10 mg by mouth daily.    Marland Kitchen aspirin 81 MG tablet Take 81 mg by mouth daily.    Marland Kitchen atorvastatin (LIPITOR) 10 MG tablet Take 10 mg by mouth daily.    . metFORMIN (GLUCOPHAGE) 500 MG tablet Take 500 mg by mouth daily with breakfast.    . ondansetron (ZOFRAN) 8 MG tablet Take 1 tablet (8 mg total) by mouth every 8 (eight) hours as needed (Nausea or vomiting). 90 tablet 1  . Prenatal Vit-Fe Fumarate-FA (MULTIVITAMIN-PRENATAL) 27-0.8 MG TABS tablet Take 1 tablet by mouth daily at 12 noon.    . prochlorperazine (COMPAZINE) 10 MG tablet Take 1 tablet (10 mg total) by mouth every 6 (six) hours as needed (Nausea or vomiting). 30 tablet 1  . Sodium Chloride Flush 0.9 % SOLN injection Inject 10 mLs into the vein daily.  60 Syringe 3  . sodium fluoride (FLUORISHIELD) 1.1 % GEL dental gel Instill one drop of gel per tooth space of fluoride tray. Place over teeth for 5 minutes. Remove. Spit out excess. Repeat nightly. 120 mL prn   No current facility-administered medications for this encounter.    REVIEW OF SYSTEMS:  Notable for that above.   PHYSICAL EXAM:  height is 5\' 7"  (1.702 m) and weight is 206 lb  12.8 oz (93.804 kg). Her oral temperature is 97.8 F (36.6 C). Her blood pressure is 125/70 and her pulse is 85. Her respiration is 20.   NAD. Ambulatory.  ECOG = 1  0 - Asymptomatic (Fully active, able to carry on all predisease activities without restriction)  1 - Symptomatic but completely ambulatory (Restricted in physically strenuous activity but ambulatory and able to carry out work of a light or sedentary nature. For example, light housework, office work)  2 - Symptomatic, <50% in bed during the day (Ambulatory and capable of all self care but unable to carry out any work activities. Up and about more than 50% of waking hours)  3 - Symptomatic, >50% in bed, but not bedbound (Capable of only limited self-care, confined to bed or chair 50% or more of waking hours)  4 - Bedbound (Completely disabled. Cannot carry on any self-care. Totally confined to bed or chair)  5 - Death   Eustace Pen MM, Creech RH, Tormey DC, et al. [phone]). "Toxicity and response criteria of the [facility]". [place] Oncol. 5 (6): 649-55   LABORATORY DATA:  Lab Results  Component Value Date   WBC 5.2 07/19/2014   HGB 10.2* 07/19/2014   HCT 31.3* 07/19/2014   MCV 97.6 07/19/2014   PLT 205 07/19/2014   CMP     Component Value Date/Time   NA 140 07/19/2014 1016   NA 138 05/22/2014 0445   K 3.9 07/19/2014 1016   K 3.0* 05/22/2014 0445   CL 101 05/22/2014 0445   CO2 23 07/19/2014 1016   CO2 26 05/22/2014 0445   GLUCOSE 151* 07/19/2014 1016   GLUCOSE 144* 05/22/2014 0445   BUN 11.7 07/19/2014 1016   BUN 5* 05/22/2014 0445   CREATININE 0.7 07/19/2014 1016   CREATININE 0.66 05/22/2014 0445   CALCIUM 8.9 07/19/2014 1016   CALCIUM 8.1* 05/22/2014 0445   PROT 6.7 07/19/2014 1016   PROT 5.9* 05/20/2014 0600   ALBUMIN 3.7 07/19/2014 1016   ALBUMIN 2.5* 05/20/2014 0600   AST 30 07/19/2014 1016   AST 10 05/20/2014 0600   ALT 29 07/19/2014 1016   ALT 17 05/20/2014 0600   ALKPHOS  112 07/19/2014 1016   ALKPHOS 67 05/20/2014 0600   BILITOT 0.44 07/19/2014 1016   BILITOT 0.3 05/20/2014 0600   GFRNONAA >90 05/22/2014 0445   GFRAA >90 05/22/2014 0445     Lab Results  Component Value Date   TSH 1.404 05/28/2014       RADIOGRAPHY: Nm Pet Image Restag (ps) Skull Base To Thigh  07/19/2014   CLINICAL DATA:  Subsequent treatment strategy for tongue cancer .  EXAM: NUCLEAR MEDICINE PET SKULL BASE TO THIGH  TECHNIQUE: 10.2 mCi F-18 FDG was injected intravenously. Full-ring PET imaging was performed from the skull base to thigh after the radiotracer. CT data was obtained and used for attenuation correction and anatomic localization.  FASTING BLOOD GLUCOSE:  Value: 97 mg/dl  COMPARISON:  None.  FINDINGS: NECK  Resolution of intense FDG uptake associated with the  base of tongue mass. SUV max within the base of tongue is equal to 2.6. This is compared with 13.5 cm previously. Right posterior cervical lymph node measures 1 cm and has an SUV max equal to 1.8. Previously this node measured 2 cm and had an SUV max equal to 7.4. Hypermetabolic nodule within the right lobe of thyroid gland measures approximately 1.3 cm and has an SUV max equal to 7.8.  CHEST  No hypermetabolic mediastinal or hilar nodes. No suspicious pulmonary nodules on the CT scan.  ABDOMEN/PELVIS  No abnormal hypermetabolic activity within the liver, pancreas, adrenal glands, or spleen. Stone identified within the gallbladder. Soft tissue stranding is identified within the mesenteric compatible with a missed the mesenteric. A few prominent lymph nodes are identified within this area which appear similar to previous exam. No definitive evidence for metastatic adenopathy to the abdomen or pelvis. No hypermetabolic lymph nodes in the abdomen or pelvis.  SKELETON  No focal hypermetabolic activity to suggest skeletal metastasis.  IMPRESSION: 1. Interval response to therapy. Specifically, there has been resolution of malignant range  FDG uptake associated with base of tongue mass and cervical lymph nodes. 2. Right posterior cervical lymph node remains borderline enlarged measuring 1 cm. There is no malignant range FDG uptake associated with this lymph node. 3. Thyroid nodule exhibiting increased FDG uptake. Hypermetabolic thyroid nodules on PET have up to 40-50% incidence of malignancy; recommend further evaluation with thyroid ultrasound and possible US-guided fine needle aspiration.   Electronically Signed   By: Kerby Moors M.D.   On: 07/19/2014 10:35      IMPRESSION/PLAN: Proceed with simulation today.  It was a pleasure meeting the patient again today. We re-discussed the risks, benefits, and side effects of radiotherapy.   We talked in detail about acute and late effects. Shee understands that some of the most bothersome acute effects will be significant soreness of the mouth and throat, changes in taste, changes in salivary function, skin irritation, hair loss, dehydration, weight loss and fatigue. We talked about late effects which include but are not necessarily limited to dysphagia, hypothyroidism, dry mouth, trismus, neck edema. No guarantees of treatment were given.  The patient is enthusiastic about proceeding with treatment. I look forward to participating in the patient's care. Consent signed today.  I spent 25 minutes  face to face with the patient and more than 50% of that time was spent in counseling and/or coordination of care.   __________________________________________   Eppie Gibson, MD

## 2014-07-27 NOTE — Progress Notes (Addendum)
Simulation, IMRT treatment planning note   Outpatient  Diagnosis:    ICD-9-CM ICD-10-CM   1. Cancer of base of tongue 141.0 C01      The patient was taken to the CT simulator and laid in the supine position on the table. An Aquaplast head and shoulder mask was custom fitted to the patient's anatomy. High-resolution CT axial imaging was obtained of the head and neck with contrast. I verified that the quality of the imaging is good for treatment planning. 1 Medically Necessary Treatment Device was fabricated and supervised by me: Aquaplast mask.   Treatment planning note I plan to treat the patient with helical Tomotherapy, IMRT. I plan to treat the patient's tumor bed and bilateral neck nodes. I plan to treat to a total dose of 70 Gray in 35  Fractions. Dose calculation was ordered from dosimetry.  IMRT planning Note  IMRT is an important modality to deliver adequate dose to the patient's at risk tissues while sparing the patient's normal structures, including the: esophagus, parotid tissue, mandible, brain stem, spinal cord, oral cavity, brachial plexus.  This justifies the use of IMRT in the patient's treatment.    -----------------------------------  Eppie Gibson, MD

## 2014-07-27 NOTE — Progress Notes (Signed)
Please see the Nurse Progress Note in the MD Initial Consult Encounter for this patient. 

## 2014-07-27 NOTE — Progress Notes (Signed)
To provide support and encouragement, care continuity and to assess for needs, met with patient during re-consult appt with Dr. Isidore Moos and during her CT Sim.  Her close friend Jonelle Sidle accompanied her. 1. I provided re-education re: purpose of mask, showed example.  She was previously provided this information when she attended [facility]. 2. She tolerated CT Sim without difficulty.  "I'm really glad I got the open mask." 3. I showed her Tomo area, explained arrival and preparation procedures. She understands she can contact me prior to her 2/16 RT start date with questions/concerns.  [name], RN, BSN, [facility] at [place] [phone]

## 2014-07-30 ENCOUNTER — Ambulatory Visit (HOSPITAL_BASED_OUTPATIENT_CLINIC_OR_DEPARTMENT_OTHER): Payer: BLUE CROSS/BLUE SHIELD

## 2014-07-30 ENCOUNTER — Ambulatory Visit
Admission: RE | Admit: 2014-07-30 | Discharge: 2014-07-30 | Disposition: A | Discharge status: Home / Self Care | Payer: BLUE CROSS/BLUE SHIELD | Source: Ambulatory Visit | Attending: Radiation Oncology | Admitting: Radiation Oncology

## 2014-07-30 ENCOUNTER — Telehealth: Payer: Self-pay | Admitting: *Deleted

## 2014-07-30 VITALS — BP 128/71 | HR 83 | Temp 97.7°F

## 2014-07-30 DIAGNOSIS — C01 Malignant neoplasm of base of tongue: Secondary | ICD-10-CM

## 2014-07-30 DIAGNOSIS — Z452 Encounter for adjustment and management of vascular access device: Secondary | ICD-10-CM

## 2014-07-30 LAB — BUN AND CREATININE (CC13)
BUN: 12.4 mg/dL (ref 7.0–26.0)
CREATININE: 0.7 mg/dL (ref 0.6–1.1)
EGFR: 90 mL/min/{1.73_m2} — ABNORMAL LOW (ref >90)

## 2014-07-30 MED ORDER — SODIUM CHLORIDE 0.9 % IJ SOLN
10.0000 mL | INTRAMUSCULAR | Status: DC | PRN
  Administered 2014-07-30: 10 mL via INTRAVENOUS
  Filled 2014-07-30: qty 10

## 2014-07-30 MED ORDER — HEPARIN SOD (PORK) LOCK FLUSH 100 UNIT/ML IV SOLN
500.0000 [IU] | Freq: Once | INTRAVENOUS | Status: AC
  Administered 2014-07-30: 250 [IU] via INTRAVENOUS
  Filled 2014-07-30: qty 5

## 2014-07-30 NOTE — Patient Instructions (Signed)
PICC Home Guide A peripherally inserted central catheter (PICC) is a long, thin, flexible tube that is inserted into a vein in the upper arm. It is a form of intravenous (IV) access. It is considered to be a "central" line because the tip of the PICC ends in a large vein in your chest. This large vein is called the superior vena cava (SVC). The PICC tip ends in the SVC because there is a lot of blood flow in the SVC. This allows medicines and IV fluids to be quickly distributed throughout the body. The PICC is inserted using a sterile technique by a specially trained nurse or physician. After the PICC is inserted, a chest X-ray exam is done to be sure it is in the correct place.  A PICC may be placed for different reasons, such as:  To give medicines and liquid nutrition that can only be given through a central line. Examples are:  Certain antibiotic treatments.  Chemotherapy.  Total parenteral nutrition (TPN).  To take frequent blood samples.  To give IV fluids and blood products.  If there is difficulty placing a peripheral intravenous (PIV) catheter. If taken care of properly, a PICC can remain in place for several months. A PICC can also allow a person to go home from the hospital early. Medicine and PICC care can be managed at home by a family member or home health care team. WHAT PROBLEMS CAN HAPPEN WHEN I HAVE A PICC? Problems with a PICC can occasionally occur. These may include the following:  A blood clot (thrombus) forming in or at the tip of the PICC. This can cause the PICC to become clogged. A clot-dissolving medicine called tissue plasminogen activator (tPA) can be given through the PICC to help break up the clot.  Inflammation of the vein (phlebitis) in which the PICC is placed. Signs of inflammation may include redness, pain at the insertion site, red streaks, or being able to feel a "cord" in the vein where the PICC is located.  Infection in the PICC or at the insertion  site. Signs of infection may include fever, chills, redness, swelling, or pus drainage from the PICC insertion site.  PICC movement (malposition). The PICC tip may move from its original position due to excessive physical activity, forceful coughing, sneezing, or vomiting.  A break or cut in the PICC. It is important to not use scissors near the PICC.  Nerve or tendon irritation or injury during PICC insertion. WHAT SHOULD I KEEP IN MIND ABOUT ACTIVITIES WHEN I HAVE A PICC?  You may bend your arm and move it freely. If your PICC is near or at the bend of your elbow, avoid activity with repeated motion at the elbow.  Rest at home for the remainder of the day following PICC line insertion.  Avoid lifting heavy objects as instructed by your health care provider.  Avoid using a crutch with the arm on the same side as your PICC. You may need to use a walker. WHAT SHOULD I KNOW ABOUT MY PICC DRESSING?  Keep your PICC bandage (dressing) clean and dry to prevent infection.  Ask your health care provider when you may shower. Ask your health care provider to teach you how to wrap the PICC when you do take a shower.  Change the PICC dressing as instructed by your health care provider.  Change your PICC dressing if it becomes loose or wet. WHAT SHOULD I KNOW ABOUT PICC CARE?  Check the PICC insertion site   daily for leakage, redness, swelling, or pain.  Do not take a bath, swim, or use hot tubs when you have a PICC. Cover PICC line with clear plastic wrap and tape to keep it dry while showering.  Flush the PICC as directed by your health care provider. Let your health care provider know right away if the PICC is difficult to flush or does not flush. Do not use force to flush the PICC.  Do not use a syringe that is less than 10 mL to flush the PICC.  Never pull or tug on the PICC.  Avoid blood pressure checks on the arm with the PICC.  Keep your PICC identification card with you at all  times.  Do not take the PICC out yourself. Only a trained clinical professional should remove the PICC. SEEK IMMEDIATE MEDICAL CARE IF:  Your PICC is accidentally pulled all the way out. If this happens, cover the insertion site with a bandage or gauze dressing. Do not throw the PICC away. Your health care provider will need to inspect it.  Your PICC was tugged or pulled and has partially come out. Do not  push the PICC back in.  There is any type of drainage, redness, or swelling where the PICC enters the skin.  You cannot flush the PICC, it is difficult to flush, or the PICC leaks around the insertion site when it is flushed.  You hear a "flushing" sound when the PICC is flushed.  You have pain, discomfort, or numbness in your arm, shoulder, or jaw on the same side as the PICC.  You feel your heart "racing" or skipping beats.  You notice a hole or tear in the PICC.  You develop chills or a fever. MAKE SURE YOU:   Understand these instructions.  Will watch your condition.  Will get help right away if you are not doing well or get worse. Document Released: 12/13/2002 Document Revised: 10/23/2013 Document Reviewed: 02/13/2013 Exit[employer] Patient Information 2015 [employer]. This information is not intended to replace advice given to you by your health care provider. Make sure you discuss any questions you have with your health care provider.  

## 2014-07-30 NOTE — Telephone Encounter (Signed)
Reviewed patient's lab results from this morning. BUN, creatinine within normal limits. Spoke with patient, thanked her for coming in for her labs today and informed her she may resume her Metformin. Patient verbalized understanding and appreciation for this call.

## 2014-08-02 ENCOUNTER — Ambulatory Visit (HOSPITAL_BASED_OUTPATIENT_CLINIC_OR_DEPARTMENT_OTHER): Payer: BLUE CROSS/BLUE SHIELD

## 2014-08-02 DIAGNOSIS — Z452 Encounter for adjustment and management of vascular access device: Secondary | ICD-10-CM

## 2014-08-02 DIAGNOSIS — C01 Malignant neoplasm of base of tongue: Secondary | ICD-10-CM

## 2014-08-02 MED ORDER — SODIUM CHLORIDE 0.9 % IJ SOLN
10.0000 mL | INTRAMUSCULAR | Status: DC | PRN
  Administered 2014-08-02: 10 mL via INTRAVENOUS
  Filled 2014-08-02: qty 10

## 2014-08-02 MED ORDER — HEPARIN SOD (PORK) LOCK FLUSH 100 UNIT/ML IV SOLN
500.0000 [IU] | Freq: Once | INTRAVENOUS | Status: AC
  Administered 2014-08-02: 250 [IU] via INTRAVENOUS
  Filled 2014-08-02: qty 5

## 2014-08-02 NOTE — Patient Instructions (Signed)
PICC Home Guide A peripherally inserted central catheter (PICC) is a long, thin, flexible tube that is inserted into a vein in the upper arm. It is a form of intravenous (IV) access. It is considered to be a "central" line because the tip of the PICC ends in a large vein in your chest. This large vein is called the superior vena cava (SVC). The PICC tip ends in the SVC because there is a lot of blood flow in the SVC. This allows medicines and IV fluids to be quickly distributed throughout the body. The PICC is inserted using a sterile technique by a specially trained nurse or physician. After the PICC is inserted, a chest X-ray exam is done to be sure it is in the correct place.  A PICC may be placed for different reasons, such as:  To give medicines and liquid nutrition that can only be given through a central line. Examples are:  Certain antibiotic treatments.  Chemotherapy.  Total parenteral nutrition (TPN).  To take frequent blood samples.  To give IV fluids and blood products.  If there is difficulty placing a peripheral intravenous (PIV) catheter. If taken care of properly, a PICC can remain in place for several months. A PICC can also allow a person to go home from the hospital early. Medicine and PICC care can be managed at home by a family member or home health care team. WHAT PROBLEMS CAN HAPPEN WHEN I HAVE A PICC? Problems with a PICC can occasionally occur. These may include the following:  A blood clot (thrombus) forming in or at the tip of the PICC. This can cause the PICC to become clogged. A clot-dissolving medicine called tissue plasminogen activator (tPA) can be given through the PICC to help break up the clot.  Inflammation of the vein (phlebitis) in which the PICC is placed. Signs of inflammation may include redness, pain at the insertion site, red streaks, or being able to feel a "cord" in the vein where the PICC is located.  Infection in the PICC or at the insertion  site. Signs of infection may include fever, chills, redness, swelling, or pus drainage from the PICC insertion site.  PICC movement (malposition). The PICC tip may move from its original position due to excessive physical activity, forceful coughing, sneezing, or vomiting.  A break or cut in the PICC. It is important to not use scissors near the PICC.  Nerve or tendon irritation or injury during PICC insertion. WHAT SHOULD I KEEP IN MIND ABOUT ACTIVITIES WHEN I HAVE A PICC?  You may bend your arm and move it freely. If your PICC is near or at the bend of your elbow, avoid activity with repeated motion at the elbow.  Rest at home for the remainder of the day following PICC line insertion.  Avoid lifting heavy objects as instructed by your health care provider.  Avoid using a crutch with the arm on the same side as your PICC. You may need to use a walker. WHAT SHOULD I KNOW ABOUT MY PICC DRESSING?  Keep your PICC bandage (dressing) clean and dry to prevent infection.  Ask your health care provider when you may shower. Ask your health care provider to teach you how to wrap the PICC when you do take a shower.  Change the PICC dressing as instructed by your health care provider.  Change your PICC dressing if it becomes loose or wet. WHAT SHOULD I KNOW ABOUT PICC CARE?  Check the PICC insertion site   daily for leakage, redness, swelling, or pain.  Do not take a bath, swim, or use hot tubs when you have a PICC. Cover PICC line with clear plastic wrap and tape to keep it dry while showering.  Flush the PICC as directed by your health care provider. Let your health care provider know right away if the PICC is difficult to flush or does not flush. Do not use force to flush the PICC.  Do not use a syringe that is less than 10 mL to flush the PICC.  Never pull or tug on the PICC.  Avoid blood pressure checks on the arm with the PICC.  Keep your PICC identification card with you at all  times.  Do not take the PICC out yourself. Only a trained clinical professional should remove the PICC. SEEK IMMEDIATE MEDICAL CARE IF:  Your PICC is accidentally pulled all the way out. If this happens, cover the insertion site with a bandage or gauze dressing. Do not throw the PICC away. Your health care provider will need to inspect it.  Your PICC was tugged or pulled and has partially come out. Do not  push the PICC back in.  There is any type of drainage, redness, or swelling where the PICC enters the skin.  You cannot flush the PICC, it is difficult to flush, or the PICC leaks around the insertion site when it is flushed.  You hear a "flushing" sound when the PICC is flushed.  You have pain, discomfort, or numbness in your arm, shoulder, or jaw on the same side as the PICC.  You feel your heart "racing" or skipping beats.  You notice a hole or tear in the PICC.  You develop chills or a fever. MAKE SURE YOU:   Understand these instructions.  Will watch your condition.  Will get help right away if you are not doing well or get worse. Document Released: 12/13/2002 Document Revised: 10/23/2013 Document Reviewed: 02/13/2013 Exit[employer] Patient Information 2015 [employer]. This information is not intended to replace advice given to you by your health care provider. Make sure you discuss any questions you have with your health care provider.  

## 2014-08-03 ENCOUNTER — Telehealth: Payer: Self-pay | Admitting: *Deleted

## 2014-08-03 DIAGNOSIS — Z510 Encounter for antineoplastic radiation therapy: Secondary | ICD-10-CM | POA: Diagnosis not present

## 2014-08-03 NOTE — Telephone Encounter (Signed)
Pt called, expressed anxiety about start of RT next week Tuesday.  I addressed her concerns about Tomo tmt procedure, SEs, duration of daily tmts. I assured her I would join her for her first tmt.  She expressed appreciation.  [name], RN, BSN, [facility] at [place] [phone]

## 2014-08-06 ENCOUNTER — Telehealth: Payer: Self-pay | Admitting: *Deleted

## 2014-08-06 DIAGNOSIS — Z510 Encounter for antineoplastic radiation therapy: Secondary | ICD-10-CM | POA: Diagnosis not present

## 2014-08-06 NOTE — Telephone Encounter (Signed)
Called patient to offer encouragement for start of Tomo tomorrow.  Spoke with her husband, asked that he convey my message.  [name], RN, BSN, [facility] at [place] [phone]

## 2014-08-07 ENCOUNTER — Ambulatory Visit
Admission: RE | Admit: 2014-08-07 | Discharge: 2014-08-07 | Disposition: A | Discharge status: Home / Self Care | Payer: BLUE CROSS/BLUE SHIELD | Source: Ambulatory Visit | Attending: Radiation Oncology | Admitting: Radiation Oncology

## 2014-08-07 ENCOUNTER — Encounter: Payer: Self-pay | Admitting: *Deleted

## 2014-08-07 DIAGNOSIS — C01 Malignant neoplasm of base of tongue: Secondary | ICD-10-CM

## 2014-08-07 DIAGNOSIS — Z510 Encounter for antineoplastic radiation therapy: Secondary | ICD-10-CM | POA: Diagnosis not present

## 2014-08-07 MED ORDER — BIAFINE EX EMUL: Freq: Two times a day (BID) | CUTANEOUS | Status: DC

## 2014-08-07 NOTE — Progress Notes (Signed)
Patient education completed with patient and husband. Gave her "Radiation and You" booklet with all pertinent information marked and discussed, re: hair loss/care, fatigue, nausea/management, mouth irritation/management, skin irritation/care, throat irritation/management, nutrition, pain. Gave her Biafine with instructions for proper use. Teach back method used. Patient and husband verbalized understanding.

## 2014-08-07 NOTE — Progress Notes (Signed)
To provide support, encouragement, and care continuity, met with patient during her New Start Tomo.  I discussed with her the CT/tmt procedure.  I provided support in tmt area between CT and tmt. She completed tmt without incident, therapists and I recognized her accomplishment.  [name], RN, BSN, [facility] at [place] [phone]

## 2014-08-08 ENCOUNTER — Ambulatory Visit
Admission: RE | Admit: 2014-08-08 | Discharge: 2014-08-08 | Disposition: A | Discharge status: Home / Self Care | Payer: BLUE CROSS/BLUE SHIELD | Source: Ambulatory Visit | Attending: Radiation Oncology | Admitting: Radiation Oncology

## 2014-08-08 DIAGNOSIS — Z510 Encounter for antineoplastic radiation therapy: Secondary | ICD-10-CM | POA: Diagnosis not present

## 2014-08-09 ENCOUNTER — Ambulatory Visit
Admission: RE | Admit: 2014-08-09 | Discharge: 2014-08-09 | Disposition: A | Discharge status: Home / Self Care | Payer: BLUE CROSS/BLUE SHIELD | Source: Ambulatory Visit | Attending: Radiation Oncology | Admitting: Radiation Oncology

## 2014-08-09 ENCOUNTER — Ambulatory Visit (HOSPITAL_BASED_OUTPATIENT_CLINIC_OR_DEPARTMENT_OTHER): Payer: BLUE CROSS/BLUE SHIELD

## 2014-08-09 VITALS — BP 123/66 | HR 87 | Temp 97.1°F

## 2014-08-09 DIAGNOSIS — Z452 Encounter for adjustment and management of vascular access device: Secondary | ICD-10-CM

## 2014-08-09 DIAGNOSIS — Z510 Encounter for antineoplastic radiation therapy: Secondary | ICD-10-CM | POA: Diagnosis not present

## 2014-08-09 DIAGNOSIS — C01 Malignant neoplasm of base of tongue: Secondary | ICD-10-CM

## 2014-08-09 MED ORDER — HEPARIN SOD (PORK) LOCK FLUSH 100 UNIT/ML IV SOLN
500.0000 [IU] | Freq: Once | INTRAVENOUS | Status: AC
  Administered 2014-08-09: 250 [IU] via INTRAVENOUS
  Filled 2014-08-09: qty 5

## 2014-08-09 MED ORDER — SODIUM CHLORIDE 0.9 % IJ SOLN
10.0000 mL | INTRAMUSCULAR | Status: DC | PRN
  Administered 2014-08-09: 10 mL via INTRAVENOUS
  Filled 2014-08-09: qty 10

## 2014-08-09 NOTE — Patient Instructions (Signed)
PICC Home Guide A peripherally inserted central catheter (PICC) is a long, thin, flexible tube that is inserted into a vein in the upper arm. It is a form of intravenous (IV) access. It is considered to be a "central" line because the tip of the PICC ends in a large vein in your chest. This large vein is called the superior vena cava (SVC). The PICC tip ends in the SVC because there is a lot of blood flow in the SVC. This allows medicines and IV fluids to be quickly distributed throughout the body. The PICC is inserted using a sterile technique by a specially trained nurse or physician. After the PICC is inserted, a chest X-ray exam is done to be sure it is in the correct place.  A PICC may be placed for different reasons, such as:  To give medicines and liquid nutrition that can only be given through a central line. Examples are:  Certain antibiotic treatments.  Chemotherapy.  Total parenteral nutrition (TPN).  To take frequent blood samples.  To give IV fluids and blood products.  If there is difficulty placing a peripheral intravenous (PIV) catheter. If taken care of properly, a PICC can remain in place for several months. A PICC can also allow a person to go home from the hospital early. Medicine and PICC care can be managed at home by a family member or home health care team. WHAT PROBLEMS CAN HAPPEN WHEN I HAVE A PICC? Problems with a PICC can occasionally occur. These may include the following:  A blood clot (thrombus) forming in or at the tip of the PICC. This can cause the PICC to become clogged. A clot-dissolving medicine called tissue plasminogen activator (tPA) can be given through the PICC to help break up the clot.  Inflammation of the vein (phlebitis) in which the PICC is placed. Signs of inflammation may include redness, pain at the insertion site, red streaks, or being able to feel a "cord" in the vein where the PICC is located.  Infection in the PICC or at the insertion  site. Signs of infection may include fever, chills, redness, swelling, or pus drainage from the PICC insertion site.  PICC movement (malposition). The PICC tip may move from its original position due to excessive physical activity, forceful coughing, sneezing, or vomiting.  A break or cut in the PICC. It is important to not use scissors near the PICC.  Nerve or tendon irritation or injury during PICC insertion. WHAT SHOULD I KEEP IN MIND ABOUT ACTIVITIES WHEN I HAVE A PICC?  You may bend your arm and move it freely. If your PICC is near or at the bend of your elbow, avoid activity with repeated motion at the elbow.  Rest at home for the remainder of the day following PICC line insertion.  Avoid lifting heavy objects as instructed by your health care provider.  Avoid using a crutch with the arm on the same side as your PICC. You may need to use a walker. WHAT SHOULD I KNOW ABOUT MY PICC DRESSING?  Keep your PICC bandage (dressing) clean and dry to prevent infection.  Ask your health care provider when you may shower. Ask your health care provider to teach you how to wrap the PICC when you do take a shower.  Change the PICC dressing as instructed by your health care provider.  Change your PICC dressing if it becomes loose or wet. WHAT SHOULD I KNOW ABOUT PICC CARE?  Check the PICC insertion site   daily for leakage, redness, swelling, or pain.  Do not take a bath, swim, or use hot tubs when you have a PICC. Cover PICC line with clear plastic wrap and tape to keep it dry while showering.  Flush the PICC as directed by your health care provider. Let your health care provider know right away if the PICC is difficult to flush or does not flush. Do not use force to flush the PICC.  Do not use a syringe that is less than 10 mL to flush the PICC.  Never pull or tug on the PICC.  Avoid blood pressure checks on the arm with the PICC.  Keep your PICC identification card with you at all  times.  Do not take the PICC out yourself. Only a trained clinical professional should remove the PICC. SEEK IMMEDIATE MEDICAL CARE IF:  Your PICC is accidentally pulled all the way out. If this happens, cover the insertion site with a bandage or gauze dressing. Do not throw the PICC away. Your health care provider will need to inspect it.  Your PICC was tugged or pulled and has partially come out. Do not  push the PICC back in.  There is any type of drainage, redness, or swelling where the PICC enters the skin.  You cannot flush the PICC, it is difficult to flush, or the PICC leaks around the insertion site when it is flushed.  You hear a "flushing" sound when the PICC is flushed.  You have pain, discomfort, or numbness in your arm, shoulder, or jaw on the same side as the PICC.  You feel your heart "racing" or skipping beats.  You notice a hole or tear in the PICC.  You develop chills or a fever. MAKE SURE YOU:   Understand these instructions.  Will watch your condition.  Will get help right away if you are not doing well or get worse. Document Released: 12/13/2002 Document Revised: 10/23/2013 Document Reviewed: 02/13/2013 Exit[employer] Patient Information 2015 [employer]. This information is not intended to replace advice given to you by your health care provider. Make sure you discuss any questions you have with your health care provider.  

## 2014-08-10 ENCOUNTER — Ambulatory Visit
Admission: RE | Admit: 2014-08-10 | Discharge: 2014-08-10 | Disposition: A | Discharge status: Home / Self Care | Payer: BLUE CROSS/BLUE SHIELD | Source: Ambulatory Visit | Attending: Radiation Oncology | Admitting: Radiation Oncology

## 2014-08-10 ENCOUNTER — Encounter: Payer: Self-pay | Admitting: *Deleted

## 2014-08-10 DIAGNOSIS — Z510 Encounter for antineoplastic radiation therapy: Secondary | ICD-10-CM | POA: Diagnosis not present

## 2014-08-10 NOTE — Progress Notes (Unsigned)
[facility] Psychosocial Distress Screening Clinical Social Work  Clinical Social Work was referred by distress screening protocol.  The patient scored a 6 on the Psychosocial Distress Thermometer which indicates moderate distress. Clinical Social Worker contacted patient by phone to assess for distress and other psychosocial needs.  Mrs. Sitts shared she has completed her first week of radiation treatment and feels she is doing "good".  She reported she was anxious about the mask and feeling claustrophobic, but she is relieved she has an "open mask" and practices guided imagery while receiving treatments.  CSW and patient discussed practicing mindfulness meditation and "taking it day by day" through treatment to minimize anxiety.  CSW briefly shared her role at cancer center and encouraged patient to participate in support programs.  Mrs. Howdeshell plans to reach out to CSW as needed.  ONCBCN DISTRESS SCREENING 07/27/2014  Screening Type Initial Screening  Distress experienced in past week (1-10) 8  Emotional problem type Nervousness/Anxiety;Adjusting to illness  Information Concerns Type   Physician notified of physical symptoms   Referral to clinical social work   Other "anxiety" listed as most distressing, may call on cell. Pt denies thoughts of harming herself; states she is "just very anxious and worried about the mask".   Clinical Social Worker follow up needed: No.  If yes, follow up plan:  Polo Riley, MSW, LCSW, OSW-C Clinical Social Worker [facility] [phone]

## 2014-08-13 ENCOUNTER — Encounter: Payer: Self-pay | Admitting: Radiation Oncology

## 2014-08-13 ENCOUNTER — Ambulatory Visit
Admission: RE | Admit: 2014-08-13 | Discharge: 2014-08-13 | Disposition: A | Discharge status: Home / Self Care | Payer: BLUE CROSS/BLUE SHIELD | Source: Ambulatory Visit | Attending: Radiation Oncology | Admitting: Radiation Oncology

## 2014-08-13 VITALS — BP 129/63 | HR 83 | Temp 97.5°F | Resp 20 | Wt 209.0 lb

## 2014-08-13 DIAGNOSIS — Z510 Encounter for antineoplastic radiation therapy: Secondary | ICD-10-CM | POA: Diagnosis not present

## 2014-08-13 DIAGNOSIS — C01 Malignant neoplasm of base of tongue: Secondary | ICD-10-CM

## 2014-08-13 NOTE — Progress Notes (Signed)
Patient denies pain, difficulty eating, swallowing, loss of appetite. She is applying Biafine cream to neck treatment area, no skin changes at this time. She is slightly fatigued.

## 2014-08-13 NOTE — Progress Notes (Signed)
Weekly Management Note:  Site: Base of tongue/bilateral neck Current Dose:  1000  cGy Projected Dose: 7000  cGy  Narrative: The patient is seen today for routine under treatment assessment. CBCT/MVCT images/port films were reviewed. The chart was reviewed.   She is without complaints today.  She is slightly fatigued.  She had an excellent response to her chemotherapy and is now receiving chemotherapy.  She did not require a PEG tube.  Physical Examination:  Filed Vitals:   08/13/14 0917  BP: 129/63  Pulse: 83  Temp: 97.5 F (36.4 C)  Resp: 20  .  Weight: 209 lb (94.802 kg).  There is no palpable lymphadenopathy in the neck.  Oral cavity and oropharynx are unremarkable to visual inspection.  Indirect mirror examination not performed today.  No evidence of candidiasis.  Laboratory data: Lab Results  Component Value Date   WBC 5.2 07/19/2014   HGB 10.2* 07/19/2014   HCT 31.3* 07/19/2014   MCV 97.6 07/19/2014   PLT 205 07/19/2014    Impression: Tolerating radiation therapy well.  Plan: Continue radiation therapy as planned.

## 2014-08-14 ENCOUNTER — Ambulatory Visit
Admission: RE | Admit: 2014-08-14 | Discharge: 2014-08-14 | Disposition: A | Discharge status: Home / Self Care | Payer: BLUE CROSS/BLUE SHIELD | Source: Ambulatory Visit | Attending: Radiation Oncology | Admitting: Radiation Oncology

## 2014-08-14 DIAGNOSIS — Z510 Encounter for antineoplastic radiation therapy: Secondary | ICD-10-CM | POA: Diagnosis not present

## 2014-08-14 NOTE — Progress Notes (Signed)
IMRT Device Note    ICD-9-CM ICD-10-CM   1. Cancer of base of tongue 141.0 C01     9.1 delivered field widths represent one set of IMRT treatment devices. The code is [phone].  -----------------------------------  Eppie Gibson, MD

## 2014-08-15 ENCOUNTER — Ambulatory Visit (HOSPITAL_BASED_OUTPATIENT_CLINIC_OR_DEPARTMENT_OTHER): Payer: BLUE CROSS/BLUE SHIELD

## 2014-08-15 ENCOUNTER — Ambulatory Visit
Admission: RE | Admit: 2014-08-15 | Discharge: 2014-08-15 | Disposition: A | Discharge status: Home / Self Care | Payer: BLUE CROSS/BLUE SHIELD | Source: Ambulatory Visit | Attending: Radiation Oncology | Admitting: Radiation Oncology

## 2014-08-15 ENCOUNTER — Ambulatory Visit (HOSPITAL_BASED_OUTPATIENT_CLINIC_OR_DEPARTMENT_OTHER): Payer: BLUE CROSS/BLUE SHIELD | Admitting: Hematology and Oncology

## 2014-08-15 ENCOUNTER — Encounter: Payer: Self-pay | Admitting: Hematology and Oncology

## 2014-08-15 VITALS — BP 129/54 | HR 86 | Temp 98.2°F | Resp 18 | Ht 67.0 in | Wt 206.1 lb

## 2014-08-15 DIAGNOSIS — C01 Malignant neoplasm of base of tongue: Secondary | ICD-10-CM

## 2014-08-15 DIAGNOSIS — Z510 Encounter for antineoplastic radiation therapy: Secondary | ICD-10-CM | POA: Diagnosis not present

## 2014-08-15 DIAGNOSIS — Z95828 Presence of other vascular implants and grafts: Secondary | ICD-10-CM

## 2014-08-15 DIAGNOSIS — B372 Candidiasis of skin and nail: Secondary | ICD-10-CM

## 2014-08-15 DIAGNOSIS — L0889 Other specified local infections of the skin and subcutaneous tissue: Secondary | ICD-10-CM

## 2014-08-15 DIAGNOSIS — Z452 Encounter for adjustment and management of vascular access device: Secondary | ICD-10-CM

## 2014-08-15 HISTORY — DX: Candidiasis of skin and nail: B37.2

## 2014-08-15 MED ORDER — NYSTATIN 100000 UNIT/GM EX POWD
Freq: Four times a day (QID) | CUTANEOUS | Status: DC
Start: 2014-08-15 — End: 2014-10-24

## 2014-08-15 MED ORDER — SODIUM CHLORIDE 0.9 % IJ SOLN
10.0000 mL | INTRAMUSCULAR | Status: DC | PRN
  Administered 2014-08-15: 10 mL via INTRAVENOUS
  Filled 2014-08-15: qty 10

## 2014-08-15 MED ORDER — HEPARIN SOD (PORK) LOCK FLUSH 100 UNIT/ML IV SOLN
500.0000 [IU] | Freq: Once | INTRAVENOUS | Status: AC
  Administered 2014-08-15: 250 [IU] via INTRAVENOUS
  Filled 2014-08-15: qty 5

## 2014-08-15 NOTE — Assessment & Plan Note (Signed)
This is related to her diabetes. I recommend nystatin topical powder.

## 2014-08-15 NOTE — Assessment & Plan Note (Signed)
She will continue PICC line flushes at home and PICC line dressing change every week. Clinically, it appears to be working well.

## 2014-08-15 NOTE — Assessment & Plan Note (Signed)
She is tolerating treatment well. Continue supportive care.

## 2014-08-15 NOTE — Progress Notes (Signed)
[place] OFFICE PROGRESS NOTE  Patient Care Team: Gaynelle Arabian, MD as PCP - General (Family Medicine) Brooks Sailors, RN as Oncology Nurse [place], RD as Dietitian (Nutrition) Eppie Gibson, MD as Attending Physician (Radiation Oncology)  SUMMARY OF ONCOLOGIC HISTORY: Oncology History   Cancer of base of tongue   Staging form: Lip and Oral Cavity, AJCC 7th Edition     Clinical: Stage IVA (T3, N2b, M0) - Signed by Heath Lark, MD on 05/09/2014       Cancer of base of tongue   04/20/2014 Imaging CT neck showed advanced base of tongue mass and multiple right neck lymphadenopathy   04/23/2014 Pathology Results [id number] FNA of right neck is positive for squamous cell cancer, P16 positive   05/09/2014 Imaging PET/CT scan confirmed base of tongue cancer along with lymph node metastasis.   05/10/2014 Procedure She has placement of PICC line.   05/11/2014 - 06/26/2014 Chemotherapy She received 3 cycles of induction chemotherapy with Taxotere, cisplatin and 5-FU.   05/19/2014 - 05/22/2014 Hospital Admission She was admitted to the hospital for management of neutropenic fever, GI bleed requiring blood transfusion and diarrhea from chemotherapy   07/19/2014 Imaging Repeat PET/CT scan showed complete response to treatment   08/07/2014 -  Radiation Therapy She received radiation treatment.    INTERVAL HISTORY: Please see below for problem oriented charting. She is seen for further supportive care. So far, she tolerated radiation treatment well. She complained of skin rash under the right breast. That has recurred recently. She has intermittent infection in that same area.  REVIEW OF SYSTEMS:   Constitutional: Denies fevers, chills or abnormal weight loss Eyes: Denies blurriness of vision Ears, nose, mouth, throat, and face: Denies mucositis or sore throat Respiratory: Denies cough, dyspnea or wheezes Cardiovascular: Denies palpitation, chest discomfort or  lower extremity swelling Gastrointestinal:  Denies nausea, heartburn or change in bowel habits Lymphatics: Denies new lymphadenopathy or easy bruising Neurological:Denies numbness, tingling or new weaknesses Behavioral/Psych: Mood is stable, no new changes  All other systems were reviewed with the patient and are negative.  I have reviewed the past medical history, past surgical history, social history and family history with the patient and they are unchanged from previous note.  ALLERGIES:  is allergic to penicillins.  MEDICATIONS:  Current Outpatient Prescriptions  Medication Sig Dispense Refill  . ALPRAZolam (XANAX) 0.25 MG tablet Take 0.25 mg by mouth as needed for anxiety.    . Alum & Mag Hydroxide-Simeth (MAGIC MOUTHWASH) SOLN Take 5 mLs by mouth 4 (four) times daily as needed for mouth pain. 500 mL 0  . amLODipine (NORVASC) 10 MG tablet Take 10 mg by mouth daily.    Marland Kitchen aspirin 81 MG tablet Take 81 mg by mouth daily.    Marland Kitchen atorvastatin (LIPITOR) 10 MG tablet Take 10 mg by mouth daily.    Marland Kitchen emollient (BIAFINE) cream Apply topically 2 (two) times daily.    . metFORMIN (GLUCOPHAGE) 500 MG tablet Take 500 mg by mouth daily with breakfast.    . ondansetron (ZOFRAN) 8 MG tablet Take 1 tablet (8 mg total) by mouth every 8 (eight) hours as needed (Nausea or vomiting). 90 tablet 1  . Prenatal Vit-Fe Fumarate-FA (MULTIVITAMIN-PRENATAL) 27-0.8 MG TABS tablet Take 1 tablet by mouth daily at 12 noon.    . prochlorperazine (COMPAZINE) 10 MG tablet Take 1 tablet (10 mg total) by mouth every 6 (six) hours as needed (Nausea or vomiting). 30 tablet 1  .  Sodium Chloride Flush 0.9 % SOLN injection Inject 10 mLs into the vein daily. 60 Syringe 3  . sodium fluoride (FLUORISHIELD) 1.1 % GEL dental gel Instill one drop of gel per tooth space of fluoride tray. Place over teeth for 5 minutes. Remove. Spit out excess. Repeat nightly. 120 mL prn  . nystatin (MYCOSTATIN) powder Apply topically 4 (four) times  daily. 30 g 0   No current facility-administered medications for this visit.    PHYSICAL EXAMINATION: ECOG PERFORMANCE STATUS: 1 - Symptomatic but completely ambulatory  Filed Vitals:   08/15/14 0845  BP: 129/54  Pulse: 86  Temp: 98.2 F (36.8 C)  Resp: 18   Filed Weights   08/15/14 0845  Weight: 206 lb 1.6 oz (93.486 kg)    GENERAL:alert, no distress and comfortable SKIN: Noted yeast infection on her skin under the breast. EYES: normal, Conjunctiva are pink and non-injected, sclera clear OROPHARYNX:no exudate, no erythema and lips, buccal mucosa, and tongue normal  NECK: supple, thyroid normal size, non-tender, without nodularity LYMPH:  no palpable lymphadenopathy in the cervical, axillary or inguinal LUNGS: clear to auscultation and percussion with normal breathing effort HEART: regular rate & rhythm and no murmurs and no lower extremity edema ABDOMEN:abdomen soft, non-tender and normal bowel sounds Musculoskeletal:no cyanosis of digits and no clubbing  NEURO: alert & oriented x 3 with fluent speech, no focal motor/sensory deficits  LABORATORY DATA:  I have reviewed the data as listed    Component Value Date/Time   NA 140 07/19/2014 1016   NA 138 05/22/2014 0445   K 3.9 07/19/2014 1016   K 3.0* 05/22/2014 0445   CL 101 05/22/2014 0445   CO2 23 07/19/2014 1016   CO2 26 05/22/2014 0445   GLUCOSE 151* 07/19/2014 1016   GLUCOSE 144* 05/22/2014 0445   BUN 12.4 07/30/2014 1022   BUN 5* 05/22/2014 0445   CREATININE 0.7 07/30/2014 1022   CREATININE 0.66 05/22/2014 0445   CALCIUM 8.9 07/19/2014 1016   CALCIUM 8.1* 05/22/2014 0445   PROT 6.7 07/19/2014 1016   PROT 5.9* 05/20/2014 0600   ALBUMIN 3.7 07/19/2014 1016   ALBUMIN 2.5* 05/20/2014 0600   AST 30 07/19/2014 1016   AST 10 05/20/2014 0600   ALT 29 07/19/2014 1016   ALT 17 05/20/2014 0600   ALKPHOS 112 07/19/2014 1016   ALKPHOS 67 05/20/2014 0600   BILITOT 0.44 07/19/2014 1016   BILITOT 0.3 05/20/2014 0600    GFRNONAA >90 05/22/2014 0445   GFRAA >90 05/22/2014 0445    No results found for: SPEP, UPEP  Lab Results  Component Value Date   WBC 5.2 07/19/2014   NEUTROABS 2.9 07/19/2014   HGB 10.2* 07/19/2014   HCT 31.3* 07/19/2014   MCV 97.6 07/19/2014   PLT 205 07/19/2014      Chemistry      Component Value Date/Time   NA 140 07/19/2014 1016   NA 138 05/22/2014 0445   K 3.9 07/19/2014 1016   K 3.0* 05/22/2014 0445   CL 101 05/22/2014 0445   CO2 23 07/19/2014 1016   CO2 26 05/22/2014 0445   BUN 12.4 07/30/2014 1022   BUN 5* 05/22/2014 0445   CREATININE 0.7 07/30/2014 1022   CREATININE 0.66 05/22/2014 0445      Component Value Date/Time   CALCIUM 8.9 07/19/2014 1016   CALCIUM 8.1* 05/22/2014 0445   ALKPHOS 112 07/19/2014 1016   ALKPHOS 67 05/20/2014 0600   AST 30 07/19/2014 1016   AST 10 05/20/2014 0600  ALT 29 07/19/2014 1016   ALT 17 05/20/2014 0600   BILITOT 0.44 07/19/2014 1016   BILITOT 0.3 05/20/2014 0600      ASSESSMENT & PLAN:  Cancer of base of tongue She is tolerating treatment well. Continue supportive care.   S/P PICC central line placement She will continue PICC line flushes at home and PICC line dressing change every week. Clinically, it appears to be working well.    Yeast infection of the skin This is related to her diabetes. I recommend nystatin topical powder.    No orders of the defined types were placed in this encounter.   All questions were answered. The patient knows to call the clinic with any problems, questions or concerns. No barriers to learning was detected. I spent 15 minutes counseling the patient face to face. The total time spent in the appointment was 20 minutes and more than 50% was on counseling and review of test results     [facility], [place], MD 08/15/2014 4:55 PM

## 2014-08-15 NOTE — Patient Instructions (Signed)
PICC Home Guide A peripherally inserted central catheter (PICC) is a long, thin, flexible tube that is inserted into a vein in the upper arm. It is a form of intravenous (IV) access. It is considered to be a "central" line because the tip of the PICC ends in a large vein in your chest. This large vein is called the superior vena cava (SVC). The PICC tip ends in the SVC because there is a lot of blood flow in the SVC. This allows medicines and IV fluids to be quickly distributed throughout the body. The PICC is inserted using a sterile technique by a specially trained nurse or physician. After the PICC is inserted, a chest X-ray exam is done to be sure it is in the correct place.  A PICC may be placed for different reasons, such as:  To give medicines and liquid nutrition that can only be given through a central line. Examples are:  Certain antibiotic treatments.  Chemotherapy.  Total parenteral nutrition (TPN).  To take frequent blood samples.  To give IV fluids and blood products.  If there is difficulty placing a peripheral intravenous (PIV) catheter. If taken care of properly, a PICC can remain in place for several months. A PICC can also allow a person to go home from the hospital early. Medicine and PICC care can be managed at home by a family member or home health care team. WHAT PROBLEMS CAN HAPPEN WHEN I HAVE A PICC? Problems with a PICC can occasionally occur. These may include the following:  A blood clot (thrombus) forming in or at the tip of the PICC. This can cause the PICC to become clogged. A clot-dissolving medicine called tissue plasminogen activator (tPA) can be given through the PICC to help break up the clot.  Inflammation of the vein (phlebitis) in which the PICC is placed. Signs of inflammation may include redness, pain at the insertion site, red streaks, or being able to feel a "cord" in the vein where the PICC is located.  Infection in the PICC or at the insertion  site. Signs of infection may include fever, chills, redness, swelling, or pus drainage from the PICC insertion site.  PICC movement (malposition). The PICC tip may move from its original position due to excessive physical activity, forceful coughing, sneezing, or vomiting.  A break or cut in the PICC. It is important to not use scissors near the PICC.  Nerve or tendon irritation or injury during PICC insertion. WHAT SHOULD I KEEP IN MIND ABOUT ACTIVITIES WHEN I HAVE A PICC?  You may bend your arm and move it freely. If your PICC is near or at the bend of your elbow, avoid activity with repeated motion at the elbow.  Rest at home for the remainder of the day following PICC line insertion.  Avoid lifting heavy objects as instructed by your health care provider.  Avoid using a crutch with the arm on the same side as your PICC. You may need to use a walker. WHAT SHOULD I KNOW ABOUT MY PICC DRESSING?  Keep your PICC bandage (dressing) clean and dry to prevent infection.  Ask your health care provider when you may shower. Ask your health care provider to teach you how to wrap the PICC when you do take a shower.  Change the PICC dressing as instructed by your health care provider.  Change your PICC dressing if it becomes loose or wet. WHAT SHOULD I KNOW ABOUT PICC CARE?  Check the PICC insertion site   daily for leakage, redness, swelling, or pain.  Do not take a bath, swim, or use hot tubs when you have a PICC. Cover PICC line with clear plastic wrap and tape to keep it dry while showering.  Flush the PICC as directed by your health care provider. Let your health care provider know right away if the PICC is difficult to flush or does not flush. Do not use force to flush the PICC.  Do not use a syringe that is less than 10 mL to flush the PICC.  Never pull or tug on the PICC.  Avoid blood pressure checks on the arm with the PICC.  Keep your PICC identification card with you at all  times.  Do not take the PICC out yourself. Only a trained clinical professional should remove the PICC. SEEK IMMEDIATE MEDICAL CARE IF:  Your PICC is accidentally pulled all the way out. If this happens, cover the insertion site with a bandage or gauze dressing. Do not throw the PICC away. Your health care provider will need to inspect it.  Your PICC was tugged or pulled and has partially come out. Do not  push the PICC back in.  There is any type of drainage, redness, or swelling where the PICC enters the skin.  You cannot flush the PICC, it is difficult to flush, or the PICC leaks around the insertion site when it is flushed.  You hear a "flushing" sound when the PICC is flushed.  You have pain, discomfort, or numbness in your arm, shoulder, or jaw on the same side as the PICC.  You feel your heart "racing" or skipping beats.  You notice a hole or tear in the PICC.  You develop chills or a fever. MAKE SURE YOU:   Understand these instructions.  Will watch your condition.  Will get help right away if you are not doing well or get worse. Document Released: 12/13/2002 Document Revised: 10/23/2013 Document Reviewed: 02/13/2013 Exit[employer] Patient Information 2015 [employer]. This information is not intended to replace advice given to you by your health care provider. Make sure you discuss any questions you have with your health care provider.  

## 2014-08-16 ENCOUNTER — Encounter: Payer: Self-pay | Admitting: *Deleted

## 2014-08-16 ENCOUNTER — Ambulatory Visit
Admission: RE | Admit: 2014-08-16 | Discharge: 2014-08-16 | Disposition: A | Discharge status: Home / Self Care | Payer: BLUE CROSS/BLUE SHIELD | Source: Ambulatory Visit | Attending: Radiation Oncology | Admitting: Radiation Oncology

## 2014-08-16 DIAGNOSIS — Z510 Encounter for antineoplastic radiation therapy: Secondary | ICD-10-CM | POA: Diagnosis not present

## 2014-08-16 NOTE — Progress Notes (Signed)
To provide support and encouragement, care continuity and to assess for needs, met with patient after her Tomo tmt. 1. She reported throat "is beginning to hurt".  I answered her questions re: probable progression of SEs, how they will be treated. 2. She expressed concern about "making it through" remaining tmts, severity of SEs.  I encouraged her to focus on daily tmts and current state of SEs.   3. She confirmed BID Biafine application, stated she applies after morning tmts and before bed.    I encouraged her to apply more frequently as needed.  She understands not to apply 4 hours prior to RT. 4. She stated that she is more comfortable with tmt procedure, visualizes "tiny soldiers marching to the cancer cells". 5. She indicated she appreciates therapist telling her when tmt is midway.  I relayed to therapist for documentation. She understands she can contact me as needs/concerns arise.  [name], RN, BSN, [place] at [place] [phone]

## 2014-08-17 ENCOUNTER — Ambulatory Visit
Admission: RE | Admit: 2014-08-17 | Discharge: 2014-08-17 | Disposition: A | Discharge status: Home / Self Care | Payer: BLUE CROSS/BLUE SHIELD | Source: Ambulatory Visit | Attending: Radiation Oncology | Admitting: Radiation Oncology

## 2014-08-17 DIAGNOSIS — Z510 Encounter for antineoplastic radiation therapy: Secondary | ICD-10-CM | POA: Diagnosis not present

## 2014-08-20 ENCOUNTER — Ambulatory Visit
Admission: RE | Admit: 2014-08-20 | Discharge: 2014-08-20 | Disposition: A | Discharge status: Home / Self Care | Payer: BLUE CROSS/BLUE SHIELD | Source: Ambulatory Visit | Attending: Radiation Oncology | Admitting: Radiation Oncology

## 2014-08-20 ENCOUNTER — Encounter: Payer: Self-pay | Admitting: Radiation Oncology

## 2014-08-20 VITALS — BP 115/58 | HR 88 | Temp 97.9°F | Resp 20 | Wt 206.6 lb

## 2014-08-20 DIAGNOSIS — Z510 Encounter for antineoplastic radiation therapy: Secondary | ICD-10-CM | POA: Diagnosis not present

## 2014-08-20 DIAGNOSIS — C01 Malignant neoplasm of base of tongue: Secondary | ICD-10-CM

## 2014-08-20 MED ORDER — SUCRALFATE 1 G PO TABS
ORAL_TABLET | ORAL | Status: DC
Start: 2014-08-20 — End: 2014-09-05

## 2014-08-20 NOTE — Progress Notes (Signed)
   Weekly Management Note:  Outpatient    ICD-9-CM ICD-10-CM   1. Cancer of base of tongue 141.0 C01 sucralfate (CARAFATE) 1 G tablet    Current Dose:  20 Gy  Projected Dose: 70 Gy   Narrative:  The patient presents for routine under treatment assessment.  CBCT/MVCT images/Port film x-rays were reviewed.  The chart was checked.  Patient denies pain, difficulty eating, swallowing, loss of appetite. She states she does tire easily. Patient is applying Biafine to neck treatment area, no skin changes at this time.  Physical Findings:  Wt Readings from Last 3 Encounters:  08/15/14 206 lb 1.6 oz (93.486 kg)  07/20/14 209 lb 4.8 oz (94.938 kg)  06/21/14 202 lb (91.627 kg)    weight is 206 lb 9.6 oz (93.713 kg). Her temperature is 97.9 F (36.6 C). Her blood pressure is 115/58 and her pulse is 88. Her respiration is 20.  few spots over mucosa c/w recently ingestion of yogurt (pt reported this).  Skin intact, no mucositis  CBC    Component Value Date/Time   WBC 5.2 07/19/2014 1015   WBC 14.2* 05/22/2014 0445   RBC 3.20* 07/19/2014 1015   RBC 2.58* 05/22/2014 0445   HGB 10.2* 07/19/2014 1015   HGB 7.7* 05/22/2014 0445   HCT 31.3* 07/19/2014 1015   HCT 22.6* 05/22/2014 0445   PLT 205 07/19/2014 1015   PLT 111* 05/22/2014 0445   MCV 97.6 07/19/2014 1015   MCV 87.6 05/22/2014 0445   MCH 31.9 07/19/2014 1015   MCH 29.8 05/22/2014 0445   MCHC 32.6 07/19/2014 1015   MCHC 34.1 05/22/2014 0445   RDW 17.7* 07/19/2014 1015   RDW 13.8 05/22/2014 0445   LYMPHSABS 1.2 07/19/2014 1015   LYMPHSABS 3.3 05/22/2014 0445   MONOABS 0.3 07/19/2014 1015   MONOABS 2.1* 05/22/2014 0445   EOSABS 0.7* 07/19/2014 1015   EOSABS 0.1 05/22/2014 0445   BASOSABS 0.1 07/19/2014 1015   BASOSABS 0.0 05/22/2014 0445     CMP     Component Value Date/Time   NA 140 07/19/2014 1016   NA 138 05/22/2014 0445   K 3.9 07/19/2014 1016   K 3.0* 05/22/2014 0445   CL 101 05/22/2014 0445   CO2 23 07/19/2014 1016     CO2 26 05/22/2014 0445   GLUCOSE 151* 07/19/2014 1016   GLUCOSE 144* 05/22/2014 0445   BUN 12.4 07/30/2014 1022   BUN 5* 05/22/2014 0445   CREATININE 0.7 07/30/2014 1022   CREATININE 0.66 05/22/2014 0445   CALCIUM 8.9 07/19/2014 1016   CALCIUM 8.1* 05/22/2014 0445   PROT 6.7 07/19/2014 1016   PROT 5.9* 05/20/2014 0600   ALBUMIN 3.7 07/19/2014 1016   ALBUMIN 2.5* 05/20/2014 0600   AST 30 07/19/2014 1016   AST 10 05/20/2014 0600   ALT 29 07/19/2014 1016   ALT 17 05/20/2014 0600   ALKPHOS 112 07/19/2014 1016   ALKPHOS 67 05/20/2014 0600   BILITOT 0.44 07/19/2014 1016   BILITOT 0.3 05/20/2014 0600   GFRNONAA >90 05/22/2014 0445   GFRAA >90 05/22/2014 0445     Impression:  The patient is tolerating radiotherapy.   Plan:  Continue radiotherapy as planned.   Sucralfate prn throat pain. -----------------------------------  Eppie Gibson, MD

## 2014-08-20 NOTE — Progress Notes (Signed)
Patient denies pain, difficulty eating, swallowing, loss of appetite. She states she does tire easily. Patient is applying Biafine to neck treatment area, no skin changes at this time.

## 2014-08-21 ENCOUNTER — Ambulatory Visit
Admission: RE | Admit: 2014-08-21 | Discharge: 2014-08-21 | Disposition: A | Discharge status: Home / Self Care | Payer: BLUE CROSS/BLUE SHIELD | Source: Ambulatory Visit | Attending: Radiation Oncology | Admitting: Radiation Oncology

## 2014-08-21 DIAGNOSIS — Z510 Encounter for antineoplastic radiation therapy: Secondary | ICD-10-CM | POA: Diagnosis not present

## 2014-08-22 ENCOUNTER — Ambulatory Visit
Admission: RE | Admit: 2014-08-22 | Discharge: 2014-08-22 | Disposition: A | Discharge status: Home / Self Care | Payer: BLUE CROSS/BLUE SHIELD | Source: Ambulatory Visit | Attending: Radiation Oncology | Admitting: Radiation Oncology

## 2014-08-22 ENCOUNTER — Ambulatory Visit (HOSPITAL_BASED_OUTPATIENT_CLINIC_OR_DEPARTMENT_OTHER): Payer: BLUE CROSS/BLUE SHIELD

## 2014-08-22 VITALS — BP 110/63 | HR 83 | Temp 98.4°F

## 2014-08-22 DIAGNOSIS — Z510 Encounter for antineoplastic radiation therapy: Secondary | ICD-10-CM | POA: Diagnosis not present

## 2014-08-22 DIAGNOSIS — C01 Malignant neoplasm of base of tongue: Secondary | ICD-10-CM

## 2014-08-22 DIAGNOSIS — Z452 Encounter for adjustment and management of vascular access device: Secondary | ICD-10-CM

## 2014-08-22 MED ORDER — SODIUM CHLORIDE 0.9 % IJ SOLN
10.0000 mL | INTRAMUSCULAR | Status: DC | PRN
  Administered 2014-08-22: 10 mL via INTRAVENOUS
  Filled 2014-08-22: qty 10

## 2014-08-22 MED ORDER — HEPARIN SOD (PORK) LOCK FLUSH 100 UNIT/ML IV SOLN
500.0000 [IU] | Freq: Once | INTRAVENOUS | Status: AC
  Administered 2014-08-22: 250 [IU] via INTRAVENOUS
  Filled 2014-08-22: qty 5

## 2014-08-22 NOTE — Patient Instructions (Signed)
PICC Home Guide A peripherally inserted central catheter (PICC) is a long, thin, flexible tube that is inserted into a vein in the upper arm. It is a form of intravenous (IV) access. It is considered to be a "central" line because the tip of the PICC ends in a large vein in your chest. This large vein is called the superior vena cava (SVC). The PICC tip ends in the SVC because there is a lot of blood flow in the SVC. This allows medicines and IV fluids to be quickly distributed throughout the body. The PICC is inserted using a sterile technique by a specially trained nurse or physician. After the PICC is inserted, a chest X-ray exam is done to be sure it is in the correct place.  A PICC may be placed for different reasons, such as:  To give medicines and liquid nutrition that can only be given through a central line. Examples are:  Certain antibiotic treatments.  Chemotherapy.  Total parenteral nutrition (TPN).  To take frequent blood samples.  To give IV fluids and blood products.  If there is difficulty placing a peripheral intravenous (PIV) catheter. If taken care of properly, a PICC can remain in place for several months. A PICC can also allow a person to go home from the hospital early. Medicine and PICC care can be managed at home by a family member or home health care team. WHAT PROBLEMS CAN HAPPEN WHEN I HAVE A PICC? Problems with a PICC can occasionally occur. These may include the following:  A blood clot (thrombus) forming in or at the tip of the PICC. This can cause the PICC to become clogged. A clot-dissolving medicine called tissue plasminogen activator (tPA) can be given through the PICC to help break up the clot.  Inflammation of the vein (phlebitis) in which the PICC is placed. Signs of inflammation may include redness, pain at the insertion site, red streaks, or being able to feel a "cord" in the vein where the PICC is located.  Infection in the PICC or at the insertion  site. Signs of infection may include fever, chills, redness, swelling, or pus drainage from the PICC insertion site.  PICC movement (malposition). The PICC tip may move from its original position due to excessive physical activity, forceful coughing, sneezing, or vomiting.  A break or cut in the PICC. It is important to not use scissors near the PICC.  Nerve or tendon irritation or injury during PICC insertion. WHAT SHOULD I KEEP IN MIND ABOUT ACTIVITIES WHEN I HAVE A PICC?  You may bend your arm and move it freely. If your PICC is near or at the bend of your elbow, avoid activity with repeated motion at the elbow.  Rest at home for the remainder of the day following PICC line insertion.  Avoid lifting heavy objects as instructed by your health care provider.  Avoid using a crutch with the arm on the same side as your PICC. You may need to use a walker. WHAT SHOULD I KNOW ABOUT MY PICC DRESSING?  Keep your PICC bandage (dressing) clean and dry to prevent infection.  Ask your health care provider when you may shower. Ask your health care provider to teach you how to wrap the PICC when you do take a shower.  Change the PICC dressing as instructed by your health care provider.  Change your PICC dressing if it becomes loose or wet. WHAT SHOULD I KNOW ABOUT PICC CARE?  Check the PICC insertion site   daily for leakage, redness, swelling, or pain.  Do not take a bath, swim, or use hot tubs when you have a PICC. Cover PICC line with clear plastic wrap and tape to keep it dry while showering.  Flush the PICC as directed by your health care provider. Let your health care provider know right away if the PICC is difficult to flush or does not flush. Do not use force to flush the PICC.  Do not use a syringe that is less than 10 mL to flush the PICC.  Never pull or tug on the PICC.  Avoid blood pressure checks on the arm with the PICC.  Keep your PICC identification card with you at all  times.  Do not take the PICC out yourself. Only a trained clinical professional should remove the PICC. SEEK IMMEDIATE MEDICAL CARE IF:  Your PICC is accidentally pulled all the way out. If this happens, cover the insertion site with a bandage or gauze dressing. Do not throw the PICC away. Your health care provider will need to inspect it.  Your PICC was tugged or pulled and has partially come out. Do not  push the PICC back in.  There is any type of drainage, redness, or swelling where the PICC enters the skin.  You cannot flush the PICC, it is difficult to flush, or the PICC leaks around the insertion site when it is flushed.  You hear a "flushing" sound when the PICC is flushed.  You have pain, discomfort, or numbness in your arm, shoulder, or jaw on the same side as the PICC.  You feel your heart "racing" or skipping beats.  You notice a hole or tear in the PICC.  You develop chills or a fever. MAKE SURE YOU:   Understand these instructions.  Will watch your condition.  Will get help right away if you are not doing well or get worse. Document Released: 12/13/2002 Document Revised: 10/23/2013 Document Reviewed: 02/13/2013 Exit[employer] Patient Information 2015 [employer]. This information is not intended to replace advice given to you by your health care provider. Make sure you discuss any questions you have with your health care provider.  

## 2014-08-23 ENCOUNTER — Ambulatory Visit
Admission: RE | Admit: 2014-08-23 | Discharge: 2014-08-23 | Disposition: A | Discharge status: Home / Self Care | Payer: BLUE CROSS/BLUE SHIELD | Source: Ambulatory Visit | Attending: Radiation Oncology | Admitting: Radiation Oncology

## 2014-08-23 DIAGNOSIS — Z510 Encounter for antineoplastic radiation therapy: Secondary | ICD-10-CM | POA: Diagnosis not present

## 2014-08-24 ENCOUNTER — Ambulatory Visit
Admission: RE | Admit: 2014-08-24 | Discharge: 2014-08-24 | Disposition: A | Discharge status: Home / Self Care | Payer: BLUE CROSS/BLUE SHIELD | Source: Ambulatory Visit | Attending: Radiation Oncology | Admitting: Radiation Oncology

## 2014-08-24 ENCOUNTER — Encounter: Payer: Self-pay | Admitting: *Deleted

## 2014-08-24 DIAGNOSIS — Z510 Encounter for antineoplastic radiation therapy: Secondary | ICD-10-CM | POA: Diagnosis not present

## 2014-08-24 NOTE — Progress Notes (Signed)
To provide support and encouragement, care continuity and to assess for needs, met with patient prior to her Tomo tmt. She reported: 1. Throat scratchy.  Using MMW prior to meals which helps with eating. 2. Not eating/drinking her usual b/c food/drink not tasting good.  She thinks she has lost weight.  I encouraged her to drink nutritional supplements. 3. She stated she does not have a pending appt with nutritionist Barb.  I indicated I would ask Barb to call her. She understands she can contact me with needs/concerns.  [name], RN, BSN, [place] at [place] [phone]

## 2014-08-27 ENCOUNTER — Ambulatory Visit
Admission: RE | Admit: 2014-08-27 | Discharge: 2014-08-27 | Disposition: A | Discharge status: Home / Self Care | Payer: BLUE CROSS/BLUE SHIELD | Source: Ambulatory Visit | Attending: Radiation Oncology | Admitting: Radiation Oncology

## 2014-08-27 ENCOUNTER — Ambulatory Visit (HOSPITAL_BASED_OUTPATIENT_CLINIC_OR_DEPARTMENT_OTHER): Payer: BLUE CROSS/BLUE SHIELD

## 2014-08-27 ENCOUNTER — Ambulatory Visit: Payer: BLUE CROSS/BLUE SHIELD

## 2014-08-27 ENCOUNTER — Ambulatory Visit (HOSPITAL_BASED_OUTPATIENT_CLINIC_OR_DEPARTMENT_OTHER)
Admission: RE | Admit: 2014-08-27 | Discharge: 2014-08-27 | Disposition: A | Discharge status: Home / Self Care | Payer: BLUE CROSS/BLUE SHIELD | Source: Ambulatory Visit | Attending: Radiation Oncology | Admitting: Radiation Oncology

## 2014-08-27 ENCOUNTER — Ambulatory Visit: Payer: Self-pay

## 2014-08-27 ENCOUNTER — Telehealth: Payer: Self-pay | Admitting: *Deleted

## 2014-08-27 ENCOUNTER — Encounter: Payer: Self-pay | Admitting: Radiation Oncology

## 2014-08-27 VITALS — BP 121/48 | HR 95 | Temp 97.8°F | Resp 20 | Wt 200.6 lb

## 2014-08-27 DIAGNOSIS — E041 Nontoxic single thyroid nodule: Secondary | ICD-10-CM

## 2014-08-27 DIAGNOSIS — D630 Anemia in neoplastic disease: Secondary | ICD-10-CM

## 2014-08-27 DIAGNOSIS — C01 Malignant neoplasm of base of tongue: Secondary | ICD-10-CM

## 2014-08-27 DIAGNOSIS — Z452 Encounter for adjustment and management of vascular access device: Secondary | ICD-10-CM

## 2014-08-27 DIAGNOSIS — Z510 Encounter for antineoplastic radiation therapy: Secondary | ICD-10-CM | POA: Diagnosis not present

## 2014-08-27 LAB — BASIC METABOLIC PANEL (CC13)
ANION GAP: 9 meq/L (ref 3–11)
BUN: 9.9 mg/dL (ref 7.0–26.0)
CO2: 27 meq/L (ref 22–29)
Calcium: 9.7 mg/dL (ref 8.4–10.4)
Chloride: 105 mEq/L (ref 98–109)
Creatinine: 0.7 mg/dL (ref 0.6–1.1)
EGFR: 90 mL/min/{1.73_m2} — ABNORMAL LOW (ref >90)
GLUCOSE: 115 mg/dL (ref 70–140)
Potassium: 4.1 mEq/L (ref 3.5–5.1)
SODIUM: 141 meq/L (ref 136–145)

## 2014-08-27 MED ORDER — HEPARIN SOD (PORK) LOCK FLUSH 100 UNIT/ML IV SOLN
500.0000 [IU] | Freq: Once | INTRAVENOUS | Status: AC
  Administered 2014-08-27: 250 [IU] via INTRAVENOUS
  Filled 2014-08-27: qty 5

## 2014-08-27 MED ORDER — HYDROCODONE-ACETAMINOPHEN 5-325 MG PO TABS: 1.0000 | ORAL_TABLET | ORAL | Status: DC | PRN

## 2014-08-27 MED ORDER — SODIUM CHLORIDE 0.9 % IJ SOLN
10.0000 mL | INTRAMUSCULAR | Status: DC | PRN
  Administered 2014-08-27: 10 mL via INTRAVENOUS
  Filled 2014-08-27: qty 10

## 2014-08-27 NOTE — Telephone Encounter (Signed)
CALLED PATIENT TO INFORM OF LAB ON 09-02-13 @ 8:30 AM, SPOKE WITH PATIENT AND SHE IS AWARE OF THIS LAB

## 2014-08-27 NOTE — Progress Notes (Signed)
Patient denies pain. She is fatigued, loss of tastes and therefore, appetite. She reports thick saliva, is using Biotene, water/salt/soda, MMW. Advised she try Papaya juice per Dory Peru.  No sign of thrush on tongue noted today. She has lost 6 lb, is eating soft foods; advised she increase protein; she has list of foods high in protein. Orthostatic VS taken. BP dropped, HR stable. She denies dizziness, HA.  Advised she increase fluid intake. She is applying Biafine to neck/face treatment area 2-3 x daily,  for hyperpigmentation.

## 2014-08-27 NOTE — Progress Notes (Signed)
Weekly Management Note:  Outpatient    ICD-9-CM ICD-10-CM   1. Cancer of base of tongue 141.0 Y09 Basic metabolic panel     HYDROcodone-acetaminophen (NORCO/VICODIN) 5-325 MG per tablet     Ambulatory referral to Nutrition and Diabetic Education     Basic metabolic panel    Current Dose:  30 Gy  Projected Dose: 70 Gy   Narrative:  The patient presents for routine under treatment assessment.  CBCT/MVCT images/Port film x-rays were reviewed.  The chart was checked.  Patient denies pain. She is fatigued, loss of tastes and therefore, appetite. She reports thick saliva, is using Biotene, water/salt/soda, MMW. Sucralfate makes her gag.  She has lost 6 lb, is eating soft foods; advised she increase protein; she has list of foods high in protein. Orthostatic VS taken. BP dropped, HR stable. She denies dizziness, HA.  Advised she increase fluid intake. She is applying Biafine to neck/face treatment area 2-3 x daily,  for hyperpigmentation.    Physical Findings:  Wt Readings from Last 3 Encounters:  08/15/14 206 lb 1.6 oz (93.486 kg)  07/20/14 209 lb 4.8 oz (94.938 kg)  06/21/14 202 lb (91.627 kg)    weight is 200 lb 9.6 oz (90.992 kg). Her temperature is 97.8 F (36.6 C). Her blood pressure is 121/48 and her pulse is 95. Her respiration is 20.  no thrush, but mucosa is erythematous in throat.  Skin intact and erythematous  CBC    Component Value Date/Time   WBC 5.2 07/19/2014 1015   WBC 14.2* 05/22/2014 0445   RBC 3.20* 07/19/2014 1015   RBC 2.58* 05/22/2014 0445   HGB 10.2* 07/19/2014 1015   HGB 7.7* 05/22/2014 0445   HCT 31.3* 07/19/2014 1015   HCT 22.6* 05/22/2014 0445   PLT 205 07/19/2014 1015   PLT 111* 05/22/2014 0445   MCV 97.6 07/19/2014 1015   MCV 87.6 05/22/2014 0445   MCH 31.9 07/19/2014 1015   MCH 29.8 05/22/2014 0445   MCHC 32.6 07/19/2014 1015   MCHC 34.1 05/22/2014 0445   RDW 17.7* 07/19/2014 1015   RDW 13.8 05/22/2014 0445   LYMPHSABS 1.2 07/19/2014 1015   LYMPHSABS 3.3 05/22/2014 0445   MONOABS 0.3 07/19/2014 1015   MONOABS 2.1* 05/22/2014 0445   EOSABS 0.7* 07/19/2014 1015   EOSABS 0.1 05/22/2014 0445   BASOSABS 0.1 07/19/2014 1015   BASOSABS 0.0 05/22/2014 0445     CMP     Component Value Date/Time   NA 140 07/19/2014 1016   NA 138 05/22/2014 0445   K 3.9 07/19/2014 1016   K 3.0* 05/22/2014 0445   CL 101 05/22/2014 0445   CO2 23 07/19/2014 1016   CO2 26 05/22/2014 0445   GLUCOSE 151* 07/19/2014 1016   GLUCOSE 144* 05/22/2014 0445   BUN 12.4 07/30/2014 1022   BUN 5* 05/22/2014 0445   CREATININE 0.7 07/30/2014 1022   CREATININE 0.66 05/22/2014 0445   CALCIUM 8.9 07/19/2014 1016   CALCIUM 8.1* 05/22/2014 0445   PROT 6.7 07/19/2014 1016   PROT 5.9* 05/20/2014 0600   ALBUMIN 3.7 07/19/2014 1016   ALBUMIN 2.5* 05/20/2014 0600   AST 30 07/19/2014 1016   AST 10 05/20/2014 0600   ALT 29 07/19/2014 1016   ALT 17 05/20/2014 0600   ALKPHOS 112 07/19/2014 1016   ALKPHOS 67 05/20/2014 0600   BILITOT 0.44 07/19/2014 1016   BILITOT 0.3 05/20/2014 0600   GFRNONAA >90 05/22/2014 0445   GFRAA >90 05/22/2014 0445  Impression:  The patient is tolerating radiotherapy.   Plan:  Continue radiotherapy as planned.   Hydrocodone RX for pain, stool softeners to prevent constipation Refer back to Dory Peru for weight loss. Push PO intake, fluids.  BMP today. IVFluids depending on results.  -----------------------------------  Eppie Gibson, MD

## 2014-08-27 NOTE — Telephone Encounter (Signed)
CALLED PATIENT TO INFORM OF NUTRITION APPT. ON 09-03-14 @ 9 AM, SPOKE WITH PATIENT AND SHE IS AWARE OF THIS APPT.

## 2014-08-27 NOTE — Patient Instructions (Signed)
PICC Home Guide A peripherally inserted central catheter (PICC) is a long, thin, flexible tube that is inserted into a vein in the upper arm. It is a form of intravenous (IV) access. It is considered to be a "central" line because the tip of the PICC ends in a large vein in your chest. This large vein is called the superior vena cava (SVC). The PICC tip ends in the SVC because there is a lot of blood flow in the SVC. This allows medicines and IV fluids to be quickly distributed throughout the body. The PICC is inserted using a sterile technique by a specially trained nurse or physician. After the PICC is inserted, a chest X-ray exam is done to be sure it is in the correct place.  A PICC may be placed for different reasons, such as:  To give medicines and liquid nutrition that can only be given through a central line. Examples are:  Certain antibiotic treatments.  Chemotherapy.  Total parenteral nutrition (TPN).  To take frequent blood samples.  To give IV fluids and blood products.  If there is difficulty placing a peripheral intravenous (PIV) catheter. If taken care of properly, a PICC can remain in place for several months. A PICC can also allow a person to go home from the hospital early. Medicine and PICC care can be managed at home by a family member or home health care team. WHAT PROBLEMS CAN HAPPEN WHEN I HAVE A PICC? Problems with a PICC can occasionally occur. These may include the following:  A blood clot (thrombus) forming in or at the tip of the PICC. This can cause the PICC to become clogged. A clot-dissolving medicine called tissue plasminogen activator (tPA) can be given through the PICC to help break up the clot.  Inflammation of the vein (phlebitis) in which the PICC is placed. Signs of inflammation may include redness, pain at the insertion site, red streaks, or being able to feel a "cord" in the vein where the PICC is located.  Infection in the PICC or at the insertion  site. Signs of infection may include fever, chills, redness, swelling, or pus drainage from the PICC insertion site.  PICC movement (malposition). The PICC tip may move from its original position due to excessive physical activity, forceful coughing, sneezing, or vomiting.  A break or cut in the PICC. It is important to not use scissors near the PICC.  Nerve or tendon irritation or injury during PICC insertion. WHAT SHOULD I KEEP IN MIND ABOUT ACTIVITIES WHEN I HAVE A PICC?  You may bend your arm and move it freely. If your PICC is near or at the bend of your elbow, avoid activity with repeated motion at the elbow.  Rest at home for the remainder of the day following PICC line insertion.  Avoid lifting heavy objects as instructed by your health care provider.  Avoid using a crutch with the arm on the same side as your PICC. You may need to use a walker. WHAT SHOULD I KNOW ABOUT MY PICC DRESSING?  Keep your PICC bandage (dressing) clean and dry to prevent infection.  Ask your health care provider when you may shower. Ask your health care provider to teach you how to wrap the PICC when you do take a shower.  Change the PICC dressing as instructed by your health care provider.  Change your PICC dressing if it becomes loose or wet. WHAT SHOULD I KNOW ABOUT PICC CARE?  Check the PICC insertion site   daily for leakage, redness, swelling, or pain.  Do not take a bath, swim, or use hot tubs when you have a PICC. Cover PICC line with clear plastic wrap and tape to keep it dry while showering.  Flush the PICC as directed by your health care provider. Let your health care provider know right away if the PICC is difficult to flush or does not flush. Do not use force to flush the PICC.  Do not use a syringe that is less than 10 mL to flush the PICC.  Never pull or tug on the PICC.  Avoid blood pressure checks on the arm with the PICC.  Keep your PICC identification card with you at all  times.  Do not take the PICC out yourself. Only a trained clinical professional should remove the PICC. SEEK IMMEDIATE MEDICAL CARE IF:  Your PICC is accidentally pulled all the way out. If this happens, cover the insertion site with a bandage or gauze dressing. Do not throw the PICC away. Your health care provider will need to inspect it.  Your PICC was tugged or pulled and has partially come out. Do not  push the PICC back in.  There is any type of drainage, redness, or swelling where the PICC enters the skin.  You cannot flush the PICC, it is difficult to flush, or the PICC leaks around the insertion site when it is flushed.  You hear a "flushing" sound when the PICC is flushed.  You have pain, discomfort, or numbness in your arm, shoulder, or jaw on the same side as the PICC.  You feel your heart "racing" or skipping beats.  You notice a hole or tear in the PICC.  You develop chills or a fever. MAKE SURE YOU:   Understand these instructions.  Will watch your condition.  Will get help right away if you are not doing well or get worse. Document Released: 12/13/2002 Document Revised: 10/23/2013 Document Reviewed: 02/13/2013 Exit[employer] Patient Information 2015 [employer]. This information is not intended to replace advice given to you by your health care provider. Make sure you discuss any questions you have with your health care provider.  

## 2014-08-28 ENCOUNTER — Ambulatory Visit
Admission: RE | Admit: 2014-08-28 | Discharge: 2014-08-28 | Disposition: A | Discharge status: Home / Self Care | Payer: BLUE CROSS/BLUE SHIELD | Source: Ambulatory Visit | Attending: Radiation Oncology | Admitting: Radiation Oncology

## 2014-08-28 DIAGNOSIS — Z510 Encounter for antineoplastic radiation therapy: Secondary | ICD-10-CM | POA: Diagnosis not present

## 2014-08-29 ENCOUNTER — Ambulatory Visit
Admission: RE | Admit: 2014-08-29 | Discharge: 2014-08-29 | Disposition: A | Discharge status: Home / Self Care | Payer: BLUE CROSS/BLUE SHIELD | Source: Ambulatory Visit | Attending: Radiation Oncology | Admitting: Radiation Oncology

## 2014-08-29 ENCOUNTER — Ambulatory Visit (HOSPITAL_BASED_OUTPATIENT_CLINIC_OR_DEPARTMENT_OTHER): Payer: BLUE CROSS/BLUE SHIELD

## 2014-08-29 VITALS — BP 116/62 | HR 86 | Temp 97.9°F

## 2014-08-29 DIAGNOSIS — Z452 Encounter for adjustment and management of vascular access device: Secondary | ICD-10-CM

## 2014-08-29 DIAGNOSIS — Z510 Encounter for antineoplastic radiation therapy: Secondary | ICD-10-CM | POA: Diagnosis not present

## 2014-08-29 DIAGNOSIS — C01 Malignant neoplasm of base of tongue: Secondary | ICD-10-CM

## 2014-08-29 MED ORDER — HEPARIN SOD (PORK) LOCK FLUSH 100 UNIT/ML IV SOLN
500.0000 [IU] | Freq: Once | INTRAVENOUS | Status: AC
  Administered 2014-08-29: 250 [IU] via INTRAVENOUS
  Filled 2014-08-29: qty 5

## 2014-08-29 MED ORDER — SODIUM CHLORIDE 0.9 % IJ SOLN
10.0000 mL | INTRAMUSCULAR | Status: DC | PRN
  Administered 2014-08-29: 10 mL via INTRAVENOUS
  Filled 2014-08-29: qty 10

## 2014-08-29 NOTE — Patient Instructions (Signed)
PICC Home Guide A peripherally inserted central catheter (PICC) is a long, thin, flexible tube that is inserted into a vein in the upper arm. It is a form of intravenous (IV) access. It is considered to be a "central" line because the tip of the PICC ends in a large vein in your chest. This large vein is called the superior vena cava (SVC). The PICC tip ends in the SVC because there is a lot of blood flow in the SVC. This allows medicines and IV fluids to be quickly distributed throughout the body. The PICC is inserted using a sterile technique by a specially trained nurse or physician. After the PICC is inserted, a chest X-ray exam is done to be sure it is in the correct place.  A PICC may be placed for different reasons, such as:  To give medicines and liquid nutrition that can only be given through a central line. Examples are:  Certain antibiotic treatments.  Chemotherapy.  Total parenteral nutrition (TPN).  To take frequent blood samples.  To give IV fluids and blood products.  If there is difficulty placing a peripheral intravenous (PIV) catheter. If taken care of properly, a PICC can remain in place for several months. A PICC can also allow a person to go home from the hospital early. Medicine and PICC care can be managed at home by a family member or home health care team. WHAT PROBLEMS CAN HAPPEN WHEN I HAVE A PICC? Problems with a PICC can occasionally occur. These may include the following:  A blood clot (thrombus) forming in or at the tip of the PICC. This can cause the PICC to become clogged. A clot-dissolving medicine called tissue plasminogen activator (tPA) can be given through the PICC to help break up the clot.  Inflammation of the vein (phlebitis) in which the PICC is placed. Signs of inflammation may include redness, pain at the insertion site, red streaks, or being able to feel a "cord" in the vein where the PICC is located.  Infection in the PICC or at the insertion  site. Signs of infection may include fever, chills, redness, swelling, or pus drainage from the PICC insertion site.  PICC movement (malposition). The PICC tip may move from its original position due to excessive physical activity, forceful coughing, sneezing, or vomiting.  A break or cut in the PICC. It is important to not use scissors near the PICC.  Nerve or tendon irritation or injury during PICC insertion. WHAT SHOULD I KEEP IN MIND ABOUT ACTIVITIES WHEN I HAVE A PICC?  You may bend your arm and move it freely. If your PICC is near or at the bend of your elbow, avoid activity with repeated motion at the elbow.  Rest at home for the remainder of the day following PICC line insertion.  Avoid lifting heavy objects as instructed by your health care provider.  Avoid using a crutch with the arm on the same side as your PICC. You may need to use a walker. WHAT SHOULD I KNOW ABOUT MY PICC DRESSING?  Keep your PICC bandage (dressing) clean and dry to prevent infection.  Ask your health care provider when you may shower. Ask your health care provider to teach you how to wrap the PICC when you do take a shower.  Change the PICC dressing as instructed by your health care provider.  Change your PICC dressing if it becomes loose or wet. WHAT SHOULD I KNOW ABOUT PICC CARE?  Check the PICC insertion site   daily for leakage, redness, swelling, or pain.  Do not take a bath, swim, or use hot tubs when you have a PICC. Cover PICC line with clear plastic wrap and tape to keep it dry while showering.  Flush the PICC as directed by your health care provider. Let your health care provider know right away if the PICC is difficult to flush or does not flush. Do not use force to flush the PICC.  Do not use a syringe that is less than 10 mL to flush the PICC.  Never pull or tug on the PICC.  Avoid blood pressure checks on the arm with the PICC.  Keep your PICC identification card with you at all  times.  Do not take the PICC out yourself. Only a trained clinical professional should remove the PICC. SEEK IMMEDIATE MEDICAL CARE IF:  Your PICC is accidentally pulled all the way out. If this happens, cover the insertion site with a bandage or gauze dressing. Do not throw the PICC away. Your health care provider will need to inspect it.  Your PICC was tugged or pulled and has partially come out. Do not  push the PICC back in.  There is any type of drainage, redness, or swelling where the PICC enters the skin.  You cannot flush the PICC, it is difficult to flush, or the PICC leaks around the insertion site when it is flushed.  You hear a "flushing" sound when the PICC is flushed.  You have pain, discomfort, or numbness in your arm, shoulder, or jaw on the same side as the PICC.  You feel your heart "racing" or skipping beats.  You notice a hole or tear in the PICC.  You develop chills or a fever. MAKE SURE YOU:   Understand these instructions.  Will watch your condition.  Will get help right away if you are not doing well or get worse. Document Released: 12/13/2002 Document Revised: 10/23/2013 Document Reviewed: 02/13/2013 Exit[employer] Patient Information 2015 [employer]. This information is not intended to replace advice given to you by your health care provider. Make sure you discuss any questions you have with your health care provider.  

## 2014-08-30 ENCOUNTER — Ambulatory Visit
Admission: RE | Admit: 2014-08-30 | Discharge: 2014-08-30 | Disposition: A | Discharge status: Home / Self Care | Payer: BLUE CROSS/BLUE SHIELD | Source: Ambulatory Visit | Attending: Radiation Oncology | Admitting: Radiation Oncology

## 2014-08-30 ENCOUNTER — Encounter: Payer: Self-pay | Admitting: *Deleted

## 2014-08-30 DIAGNOSIS — Z510 Encounter for antineoplastic radiation therapy: Secondary | ICD-10-CM | POA: Diagnosis not present

## 2014-08-30 NOTE — Progress Notes (Signed)
Received call from patient stating she has noticed "some white spots on her tongue". She denies pain, difficulty eating/swallowing. Her treatment time today is 11:40 am. Informed her this RN will be unavailable but will inform [name]. Advised patient that if Liliane Channel is unavailable, she may see any one of the other RNs. Left [employer] with above information. Called patient and informed her that either [place] or one of the other rad onc RNs will see her today following radiation. She verbalized understanding.  Spoke with Colletta [place], RT on Tomo and informed her of above. She verbalized understanding.

## 2014-08-30 NOTE — Progress Notes (Signed)
Met patient prior to Tomo tmt to assess her self-reported "white spots on tongue".  She indicated spots were gone now after additional tongue brushing.  I confirmed absence of oral growths.  She denied oral pain/discomfort.  She denied any needs or concerns; I encouraged her to contact me if that changes before I see her next, she verbalized understanding.  [name], RN, BSN, [place] at [place] [phone]

## 2014-08-31 ENCOUNTER — Ambulatory Visit
Admission: RE | Admit: 2014-08-31 | Discharge: 2014-08-31 | Disposition: A | Discharge status: Home / Self Care | Payer: BLUE CROSS/BLUE SHIELD | Source: Ambulatory Visit | Attending: Radiation Oncology | Admitting: Radiation Oncology

## 2014-08-31 DIAGNOSIS — Z510 Encounter for antineoplastic radiation therapy: Secondary | ICD-10-CM | POA: Diagnosis not present

## 2014-09-03 ENCOUNTER — Ambulatory Visit
Admission: RE | Admit: 2014-09-03 | Discharge: 2014-09-03 | Disposition: A | Discharge status: Home / Self Care | Payer: BLUE CROSS/BLUE SHIELD | Source: Ambulatory Visit | Attending: Radiation Oncology | Admitting: Radiation Oncology

## 2014-09-03 ENCOUNTER — Ambulatory Visit (HOSPITAL_BASED_OUTPATIENT_CLINIC_OR_DEPARTMENT_OTHER): Payer: BLUE CROSS/BLUE SHIELD

## 2014-09-03 ENCOUNTER — Encounter: Payer: Self-pay | Admitting: Radiation Oncology

## 2014-09-03 ENCOUNTER — Ambulatory Visit: Payer: BLUE CROSS/BLUE SHIELD | Admitting: Nutrition

## 2014-09-03 ENCOUNTER — Ambulatory Visit: Payer: Self-pay

## 2014-09-03 ENCOUNTER — Ambulatory Visit: Payer: BLUE CROSS/BLUE SHIELD

## 2014-09-03 VITALS — BP 122/63 | HR 78 | Temp 97.8°F

## 2014-09-03 VITALS — BP 114/64 | HR 119 | Temp 98.1°F | Resp 20 | Wt 196.1 lb

## 2014-09-03 DIAGNOSIS — Z510 Encounter for antineoplastic radiation therapy: Secondary | ICD-10-CM | POA: Diagnosis not present

## 2014-09-03 DIAGNOSIS — C01 Malignant neoplasm of base of tongue: Secondary | ICD-10-CM

## 2014-09-03 DIAGNOSIS — Z452 Encounter for adjustment and management of vascular access device: Secondary | ICD-10-CM

## 2014-09-03 DIAGNOSIS — Z87891 Personal history of nicotine dependence: Secondary | ICD-10-CM | POA: Diagnosis not present

## 2014-09-03 LAB — BASIC METABOLIC PANEL (CC13)
ANION GAP: 10 meq/L (ref 3–11)
BUN: 10.9 mg/dL (ref 7.0–26.0)
CO2: 26 mEq/L (ref 22–29)
CREATININE: 0.7 mg/dL (ref 0.6–1.1)
Calcium: 9.7 mg/dL (ref 8.4–10.4)
Chloride: 103 mEq/L (ref 98–109)
EGFR: 88 mL/min/{1.73_m2} — ABNORMAL LOW (ref >90)
Glucose: 136 mg/dl (ref 70–140)
Potassium: 3.9 mEq/L (ref 3.5–5.1)
SODIUM: 139 meq/L (ref 136–145)

## 2014-09-03 MED ORDER — FLUCONAZOLE 100 MG PO TABS: ORAL_TABLET | ORAL | Status: DC

## 2014-09-03 MED ORDER — HEPARIN SOD (PORK) LOCK FLUSH 100 UNIT/ML IV SOLN
500.0000 [IU] | Freq: Once | INTRAVENOUS | Status: AC
  Administered 2014-09-03: 250 [IU] via INTRAVENOUS
  Filled 2014-09-03: qty 5

## 2014-09-03 MED ORDER — BIAFINE EX EMUL
Freq: Two times a day (BID) | CUTANEOUS | Status: DC
  Administered 2014-09-03: 10:00:00 via TOPICAL

## 2014-09-03 MED ORDER — SODIUM CHLORIDE 0.9 % IJ SOLN
10.0000 mL | INTRAMUSCULAR | Status: DC | PRN
  Administered 2014-09-03: 10 mL via INTRAVENOUS
  Filled 2014-09-03: qty 10

## 2014-09-03 NOTE — Patient Instructions (Signed)
PICC Home Guide A peripherally inserted central catheter (PICC) is a long, thin, flexible tube that is inserted into a vein in the upper arm. It is a form of intravenous (IV) access. It is considered to be a "central" line because the tip of the PICC ends in a large vein in your chest. This large vein is called the superior vena cava (SVC). The PICC tip ends in the SVC because there is a lot of blood flow in the SVC. This allows medicines and IV fluids to be quickly distributed throughout the body. The PICC is inserted using a sterile technique by a specially trained nurse or physician. After the PICC is inserted, a chest X-ray exam is done to be sure it is in the correct place.  A PICC may be placed for different reasons, such as:  To give medicines and liquid nutrition that can only be given through a central line. Examples are:  Certain antibiotic treatments.  Chemotherapy.  Total parenteral nutrition (TPN).  To take frequent blood samples.  To give IV fluids and blood products.  If there is difficulty placing a peripheral intravenous (PIV) catheter. If taken care of properly, a PICC can remain in place for several months. A PICC can also allow a person to go home from the hospital early. Medicine and PICC care can be managed at home by a family member or home health care team. WHAT PROBLEMS CAN HAPPEN WHEN I HAVE A PICC? Problems with a PICC can occasionally occur. These may include the following:  A blood clot (thrombus) forming in or at the tip of the PICC. This can cause the PICC to become clogged. A clot-dissolving medicine called tissue plasminogen activator (tPA) can be given through the PICC to help break up the clot.  Inflammation of the vein (phlebitis) in which the PICC is placed. Signs of inflammation may include redness, pain at the insertion site, red streaks, or being able to feel a "cord" in the vein where the PICC is located.  Infection in the PICC or at the insertion  site. Signs of infection may include fever, chills, redness, swelling, or pus drainage from the PICC insertion site.  PICC movement (malposition). The PICC tip may move from its original position due to excessive physical activity, forceful coughing, sneezing, or vomiting.  A break or cut in the PICC. It is important to not use scissors near the PICC.  Nerve or tendon irritation or injury during PICC insertion. WHAT SHOULD I KEEP IN MIND ABOUT ACTIVITIES WHEN I HAVE A PICC?  You may bend your arm and move it freely. If your PICC is near or at the bend of your elbow, avoid activity with repeated motion at the elbow.  Rest at home for the remainder of the day following PICC line insertion.  Avoid lifting heavy objects as instructed by your health care provider.  Avoid using a crutch with the arm on the same side as your PICC. You may need to use a walker. WHAT SHOULD I KNOW ABOUT MY PICC DRESSING?  Keep your PICC bandage (dressing) clean and dry to prevent infection.  Ask your health care provider when you may shower. Ask your health care provider to teach you how to wrap the PICC when you do take a shower.  Change the PICC dressing as instructed by your health care provider.  Change your PICC dressing if it becomes loose or wet. WHAT SHOULD I KNOW ABOUT PICC CARE?  Check the PICC insertion site   daily for leakage, redness, swelling, or pain.  Do not take a bath, swim, or use hot tubs when you have a PICC. Cover PICC line with clear plastic wrap and tape to keep it dry while showering.  Flush the PICC as directed by your health care provider. Let your health care provider know right away if the PICC is difficult to flush or does not flush. Do not use force to flush the PICC.  Do not use a syringe that is less than 10 mL to flush the PICC.  Never pull or tug on the PICC.  Avoid blood pressure checks on the arm with the PICC.  Keep your PICC identification card with you at all  times.  Do not take the PICC out yourself. Only a trained clinical professional should remove the PICC. SEEK IMMEDIATE MEDICAL CARE IF:  Your PICC is accidentally pulled all the way out. If this happens, cover the insertion site with a bandage or gauze dressing. Do not throw the PICC away. Your health care provider will need to inspect it.  Your PICC was tugged or pulled and has partially come out. Do not  push the PICC back in.  There is any type of drainage, redness, or swelling where the PICC enters the skin.  You cannot flush the PICC, it is difficult to flush, or the PICC leaks around the insertion site when it is flushed.  You hear a "flushing" sound when the PICC is flushed.  You have pain, discomfort, or numbness in your arm, shoulder, or jaw on the same side as the PICC.  You feel your heart "racing" or skipping beats.  You notice a hole or tear in the PICC.  You develop chills or a fever. MAKE SURE YOU:   Understand these instructions.  Will watch your condition.  Will get help right away if you are not doing well or get worse. Document Released: 12/13/2002 Document Revised: 10/23/2013 Document Reviewed: 02/13/2013 Exit[employer] Patient Information 2015 [employer]. This information is not intended to replace advice given to you by your health care provider. Make sure you discuss any questions you have with your health care provider.  

## 2014-09-03 NOTE — Progress Notes (Signed)
Weekly Management Note:  Outpatient    ICD-9-CM ICD-10-CM   1. Cancer of base of tongue 141.0 C01 fluconazole (DIFLUCAN) 100 MG tablet     topical emolient (BIAFINE) emulsion    Current Dose:  40 Gy  Projected Dose: 70 Gy   Narrative:  The patient presents for routine under treatment assessment.  CBCT/MVCT images/Port film x-rays were reviewed.  The chart was checked.   Patient denies pain except with swallowing, denies HA, dizziness, nausea. She is very fatigued, has no tastes and therefore, has loss of appetite. She reports continued thick saliva, is using Biotene, water/salt/soda, MMW. She drinks one protein supplement but not daily. She has lost 4.8 lb, is eating soft foods; advised she increase protein; she has list of foods high in protein. Orthostatic VS taken. BP stable, HR increased. Asymptomatic, not dizzy.  She is applying Biafine to neck/face treatment area 2-3 x daily, for hyperpigmentation   Physical Findings:  Wt Readings from Last 3 Encounters:  08/15/14 206 lb 1.6 oz (93.486 kg)  07/20/14 209 lb 4.8 oz (94.938 kg)  06/21/14 202 lb (91.627 kg)    weight is 196 lb 1.6 oz (88.95 kg). Her oral temperature is 98.1 F (36.7 C). Her blood pressure is 114/64 and her pulse is 119. Her respiration is 20.   mucosa is erythematous in throat.  Thrush over buccal mucosa. Skin intact and erythematous/pigmented  CBC    Component Value Date/Time   WBC 5.2 07/19/2014 1015   WBC 14.2* 05/22/2014 0445   RBC 3.20* 07/19/2014 1015   RBC 2.58* 05/22/2014 0445   HGB 10.2* 07/19/2014 1015   HGB 7.7* 05/22/2014 0445   HCT 31.3* 07/19/2014 1015   HCT 22.6* 05/22/2014 0445   PLT 205 07/19/2014 1015   PLT 111* 05/22/2014 0445   MCV 97.6 07/19/2014 1015   MCV 87.6 05/22/2014 0445   MCH 31.9 07/19/2014 1015   MCH 29.8 05/22/2014 0445   MCHC 32.6 07/19/2014 1015   MCHC 34.1 05/22/2014 0445   RDW 17.7* 07/19/2014 1015   RDW 13.8 05/22/2014 0445   LYMPHSABS 1.2 07/19/2014 1015   LYMPHSABS 3.3 05/22/2014 0445   MONOABS 0.3 07/19/2014 1015   MONOABS 2.1* 05/22/2014 0445   EOSABS 0.7* 07/19/2014 1015   EOSABS 0.1 05/22/2014 0445   BASOSABS 0.1 07/19/2014 1015   BASOSABS 0.0 05/22/2014 0445     CMP     Component Value Date/Time   NA 139 09/03/2014 0921   NA 138 05/22/2014 0445   K 3.9 09/03/2014 0921   K 3.0* 05/22/2014 0445   CL 101 05/22/2014 0445   CO2 26 09/03/2014 0921   CO2 26 05/22/2014 0445   GLUCOSE 136 09/03/2014 0921   GLUCOSE 144* 05/22/2014 0445   BUN 10.9 09/03/2014 0921   BUN 5* 05/22/2014 0445   CREATININE 0.7 09/03/2014 0921   CREATININE 0.66 05/22/2014 0445   CALCIUM 9.7 09/03/2014 0921   CALCIUM 8.1* 05/22/2014 0445   PROT 6.7 07/19/2014 1016   PROT 5.9* 05/20/2014 0600   ALBUMIN 3.7 07/19/2014 1016   ALBUMIN 2.5* 05/20/2014 0600   AST 30 07/19/2014 1016   AST 10 05/20/2014 0600   ALT 29 07/19/2014 1016   ALT 17 05/20/2014 0600   ALKPHOS 112 07/19/2014 1016   ALKPHOS 67 05/20/2014 0600   BILITOT 0.44 07/19/2014 1016   BILITOT 0.3 05/20/2014 0600   GFRNONAA >90 05/22/2014 0445   GFRAA >90 05/22/2014 0445     Impression:  The patient is  tolerating radiotherapy.  Weight loss of 4lb in 1 week.    Plan:  Continue radiotherapy as planned.   Hydrocodone RX for pain, stool softeners to prevent constipation.  Continue to follow w/ Dory Peru for weight loss. Push PO intake, fluids.   BMP today.   IV fluids prn in the future. Fluconazole for thrush. -----------------------------------  Eppie Gibson, MD

## 2014-09-03 NOTE — Progress Notes (Signed)
Nutrition follow-up completed with patient and husband. Patient is receiving radiation therapy to be finished on April 4. Weight decreased and documented as 196.1 pounds March 14. Patient reports taste alterations.  Most food tastes bland.  Metallic taste improved. Patient reports thick saliva. Patient reports she feels dehydrated.  Nutrition diagnosis: Predicted suboptimal energy intake has evolved into inadequate oral intake related to tongue cancer and associated treatments as evidenced by 14 pound weight loss.  Intervention: Patient educated on strategies for improving oral intake including small frequent meals and snacks. Reviewed soft, high-protein foods. Recommended patient consume oral nutrition supplements twice a day Provided samples of [place] protein powder with purchasing information. Reviewed strategies for increasing hydration. Reviewed strategies for improving taste. Provided fact sheets and contact information.  Questions were answered and teach back method used.  Monitoring, evaluation, goals: Patient will tolerate increased calories and protein for minimal weight loss and improve healing.  Next visit: Monday, March 21.  After radiation therapy.  **Disclaimer: This note was dictated with voice recognition software. Similar sounding words can inadvertently be transcribed and this note may contain transcription errors which may not have been corrected upon publication of note.**

## 2014-09-03 NOTE — Progress Notes (Signed)
Patient denies pain except with swallowing, denies HA, dizziness, nausea. She is very fatigued, has no tastes and therefore, has loss of appetite. She reports continued thick saliva, is using Biotene, water/salt/soda, MMW. She drinks one protein supplement but not daily.  No sign of thrush on tongue noted today. She has lost 4.8 lb, is eating soft foods; advised she increase protein; she has list of foods high in protein. Orthostatic VS taken. BP stable, HR increased. Advised she increase fluid intake. She is applying Biafine to neck/face treatment area 2-3 x daily, for hyperpigmentation, no desquamation noted today.Marland Kitchen

## 2014-09-04 ENCOUNTER — Ambulatory Visit
Admission: RE | Admit: 2014-09-04 | Discharge: 2014-09-04 | Disposition: A | Discharge status: Home / Self Care | Payer: BLUE CROSS/BLUE SHIELD | Source: Ambulatory Visit | Attending: Radiation Oncology | Admitting: Radiation Oncology

## 2014-09-04 DIAGNOSIS — Z510 Encounter for antineoplastic radiation therapy: Secondary | ICD-10-CM | POA: Diagnosis not present

## 2014-09-05 ENCOUNTER — Ambulatory Visit
Admission: RE | Admit: 2014-09-05 | Discharge: 2014-09-05 | Disposition: A | Discharge status: Home / Self Care | Payer: BLUE CROSS/BLUE SHIELD | Source: Ambulatory Visit | Attending: Radiation Oncology | Admitting: Radiation Oncology

## 2014-09-05 ENCOUNTER — Encounter: Payer: Self-pay | Admitting: Hematology and Oncology

## 2014-09-05 ENCOUNTER — Telehealth: Payer: Self-pay | Admitting: Hematology and Oncology

## 2014-09-05 ENCOUNTER — Ambulatory Visit: Payer: BLUE CROSS/BLUE SHIELD

## 2014-09-05 ENCOUNTER — Ambulatory Visit (HOSPITAL_BASED_OUTPATIENT_CLINIC_OR_DEPARTMENT_OTHER): Payer: BLUE CROSS/BLUE SHIELD | Admitting: Hematology and Oncology

## 2014-09-05 VITALS — BP 124/64 | HR 89 | Temp 97.9°F | Resp 18 | Ht 67.0 in | Wt 194.8 lb

## 2014-09-05 DIAGNOSIS — E860 Dehydration: Secondary | ICD-10-CM

## 2014-09-05 DIAGNOSIS — L589 Radiodermatitis, unspecified: Secondary | ICD-10-CM

## 2014-09-05 DIAGNOSIS — K1233 Oral mucositis (ulcerative) due to radiation: Secondary | ICD-10-CM

## 2014-09-05 DIAGNOSIS — Z452 Encounter for adjustment and management of vascular access device: Secondary | ICD-10-CM

## 2014-09-05 DIAGNOSIS — Z510 Encounter for antineoplastic radiation therapy: Secondary | ICD-10-CM | POA: Diagnosis not present

## 2014-09-05 DIAGNOSIS — K1231 Oral mucositis (ulcerative) due to antineoplastic therapy: Secondary | ICD-10-CM

## 2014-09-05 DIAGNOSIS — C01 Malignant neoplasm of base of tongue: Secondary | ICD-10-CM

## 2014-09-05 DIAGNOSIS — L598 Other specified disorders of the skin and subcutaneous tissue related to radiation: Secondary | ICD-10-CM

## 2014-09-05 MED ORDER — SODIUM CHLORIDE 0.9 % IJ SOLN
10.0000 mL | INTRAMUSCULAR | Status: DC | PRN
  Administered 2014-09-05: 10 mL via INTRAVENOUS
  Filled 2014-09-05: qty 10

## 2014-09-05 MED ORDER — MAGIC MOUTHWASH: 5.0000 mL | Freq: Four times a day (QID) | ORAL | Status: DC | PRN

## 2014-09-05 MED ORDER — HEPARIN SOD (PORK) LOCK FLUSH 100 UNIT/ML IV SOLN
500.0000 [IU] | Freq: Once | INTRAVENOUS | Status: AC
  Administered 2014-09-05: 250 [IU] via INTRAVENOUS
  Filled 2014-09-05: qty 5

## 2014-09-05 NOTE — Patient Instructions (Signed)
PICC Home Guide A peripherally inserted central catheter (PICC) is a long, thin, flexible tube that is inserted into a vein in the upper arm. It is a form of intravenous (IV) access. It is considered to be a "central" line because the tip of the PICC ends in a large vein in your chest. This large vein is called the superior vena cava (SVC). The PICC tip ends in the SVC because there is a lot of blood flow in the SVC. This allows medicines and IV fluids to be quickly distributed throughout the body. The PICC is inserted using a sterile technique by a specially trained nurse or physician. After the PICC is inserted, a chest X-ray exam is done to be sure it is in the correct place.  A PICC may be placed for different reasons, such as:  To give medicines and liquid nutrition that can only be given through a central line. Examples are:  Certain antibiotic treatments.  Chemotherapy.  Total parenteral nutrition (TPN).  To take frequent blood samples.  To give IV fluids and blood products.  If there is difficulty placing a peripheral intravenous (PIV) catheter. If taken care of properly, a PICC can remain in place for several months. A PICC can also allow a person to go home from the hospital early. Medicine and PICC care can be managed at home by a family member or home health care team. WHAT PROBLEMS CAN HAPPEN WHEN I HAVE A PICC? Problems with a PICC can occasionally occur. These may include the following:  A blood clot (thrombus) forming in or at the tip of the PICC. This can cause the PICC to become clogged. A clot-dissolving medicine called tissue plasminogen activator (tPA) can be given through the PICC to help break up the clot.  Inflammation of the vein (phlebitis) in which the PICC is placed. Signs of inflammation may include redness, pain at the insertion site, red streaks, or being able to feel a "cord" in the vein where the PICC is located.  Infection in the PICC or at the insertion  site. Signs of infection may include fever, chills, redness, swelling, or pus drainage from the PICC insertion site.  PICC movement (malposition). The PICC tip may move from its original position due to excessive physical activity, forceful coughing, sneezing, or vomiting.  A break or cut in the PICC. It is important to not use scissors near the PICC.  Nerve or tendon irritation or injury during PICC insertion. WHAT SHOULD I KEEP IN MIND ABOUT ACTIVITIES WHEN I HAVE A PICC?  You may bend your arm and move it freely. If your PICC is near or at the bend of your elbow, avoid activity with repeated motion at the elbow.  Rest at home for the remainder of the day following PICC line insertion.  Avoid lifting heavy objects as instructed by your health care provider.  Avoid using a crutch with the arm on the same side as your PICC. You may need to use a walker. WHAT SHOULD I KNOW ABOUT MY PICC DRESSING?  Keep your PICC bandage (dressing) clean and dry to prevent infection.  Ask your health care provider when you may shower. Ask your health care provider to teach you how to wrap the PICC when you do take a shower.  Change the PICC dressing as instructed by your health care provider.  Change your PICC dressing if it becomes loose or wet. WHAT SHOULD I KNOW ABOUT PICC CARE?  Check the PICC insertion site   daily for leakage, redness, swelling, or pain.  Do not take a bath, swim, or use hot tubs when you have a PICC. Cover PICC line with clear plastic wrap and tape to keep it dry while showering.  Flush the PICC as directed by your health care provider. Let your health care provider know right away if the PICC is difficult to flush or does not flush. Do not use force to flush the PICC.  Do not use a syringe that is less than 10 mL to flush the PICC.  Never pull or tug on the PICC.  Avoid blood pressure checks on the arm with the PICC.  Keep your PICC identification card with you at all  times.  Do not take the PICC out yourself. Only a trained clinical professional should remove the PICC. SEEK IMMEDIATE MEDICAL CARE IF:  Your PICC is accidentally pulled all the way out. If this happens, cover the insertion site with a bandage or gauze dressing. Do not throw the PICC away. Your health care provider will need to inspect it.  Your PICC was tugged or pulled and has partially come out. Do not  push the PICC back in.  There is any type of drainage, redness, or swelling where the PICC enters the skin.  You cannot flush the PICC, it is difficult to flush, or the PICC leaks around the insertion site when it is flushed.  You hear a "flushing" sound when the PICC is flushed.  You have pain, discomfort, or numbness in your arm, shoulder, or jaw on the same side as the PICC.  You feel your heart "racing" or skipping beats.  You notice a hole or tear in the PICC.  You develop chills or a fever. MAKE SURE YOU:   Understand these instructions.  Will watch your condition.  Will get help right away if you are not doing well or get worse. Document Released: 12/13/2002 Document Revised: 10/23/2013 Document Reviewed: 02/13/2013 Exit[employer] Patient Information 2015 [employer]. This information is not intended to replace advice given to you by your health care provider. Make sure you discuss any questions you have with your health care provider.  

## 2014-09-05 NOTE — Telephone Encounter (Signed)
Gave and printed appt sched and avs for pt for March and April...sed added tx.

## 2014-09-06 ENCOUNTER — Encounter: Payer: Self-pay | Admitting: *Deleted

## 2014-09-06 ENCOUNTER — Ambulatory Visit
Admission: RE | Admit: 2014-09-06 | Discharge: 2014-09-06 | Disposition: A | Discharge status: Home / Self Care | Payer: BLUE CROSS/BLUE SHIELD | Source: Ambulatory Visit | Attending: Radiation Oncology | Admitting: Radiation Oncology

## 2014-09-06 ENCOUNTER — Ambulatory Visit (HOSPITAL_BASED_OUTPATIENT_CLINIC_OR_DEPARTMENT_OTHER): Payer: BLUE CROSS/BLUE SHIELD

## 2014-09-06 ENCOUNTER — Ambulatory Visit: Payer: BLUE CROSS/BLUE SHIELD

## 2014-09-06 ENCOUNTER — Other Ambulatory Visit (HOSPITAL_BASED_OUTPATIENT_CLINIC_OR_DEPARTMENT_OTHER): Payer: BLUE CROSS/BLUE SHIELD

## 2014-09-06 DIAGNOSIS — Z452 Encounter for adjustment and management of vascular access device: Secondary | ICD-10-CM

## 2014-09-06 DIAGNOSIS — K1233 Oral mucositis (ulcerative) due to radiation: Secondary | ICD-10-CM | POA: Insufficient documentation

## 2014-09-06 DIAGNOSIS — C01 Malignant neoplasm of base of tongue: Secondary | ICD-10-CM

## 2014-09-06 DIAGNOSIS — K1231 Oral mucositis (ulcerative) due to antineoplastic therapy: Secondary | ICD-10-CM

## 2014-09-06 DIAGNOSIS — E860 Dehydration: Secondary | ICD-10-CM

## 2014-09-06 DIAGNOSIS — L589 Radiodermatitis, unspecified: Secondary | ICD-10-CM | POA: Insufficient documentation

## 2014-09-06 DIAGNOSIS — Z510 Encounter for antineoplastic radiation therapy: Secondary | ICD-10-CM | POA: Diagnosis not present

## 2014-09-06 LAB — CBC WITH DIFFERENTIAL/PLATELET
BASO%: 0.3 % (ref 0.0–2.0)
BASOS ABS: 0 10*3/uL (ref 0.0–0.1)
EOS%: 3.4 % (ref 0.0–7.0)
Eosinophils Absolute: 0.2 10*3/uL (ref 0.0–0.5)
HEMATOCRIT: 36.2 % (ref 34.8–46.6)
HEMOGLOBIN: 12 g/dL (ref 11.6–15.9)
LYMPH#: 0.4 10*3/uL — AB (ref 0.9–3.3)
LYMPH%: 8.6 % — ABNORMAL LOW (ref 14.0–49.7)
MCH: 31 pg (ref 25.1–34.0)
MCHC: 33.1 g/dL (ref 31.5–36.0)
MCV: 93.8 fL (ref 79.5–101.0)
MONO#: 0.4 10*3/uL (ref 0.1–0.9)
MONO%: 7.6 % (ref 0.0–14.0)
NEUT#: 3.8 10*3/uL (ref 1.5–6.5)
NEUT%: 80.1 % — ABNORMAL HIGH (ref 38.4–76.8)
PLATELETS: 188 10*3/uL (ref 145–400)
RBC: 3.86 10*6/uL (ref 3.70–5.45)
RDW: 12.4 % (ref 11.2–14.5)
WBC: 4.8 10*3/uL (ref 3.9–10.3)

## 2014-09-06 LAB — COMPREHENSIVE METABOLIC PANEL (CC13)
ALT: 20 U/L (ref 0–55)
AST: 17 U/L (ref 5–34)
Albumin: 3.8 g/dL (ref 3.5–5.0)
Alkaline Phosphatase: 83 U/L (ref 40–150)
Anion Gap: 8 mEq/L (ref 3–11)
BUN: 9.3 mg/dL (ref 7.0–26.0)
CALCIUM: 9.6 mg/dL (ref 8.4–10.4)
CHLORIDE: 103 meq/L (ref 98–109)
CO2: 27 meq/L (ref 22–29)
Creatinine: 0.7 mg/dL (ref 0.6–1.1)
EGFR: 88 mL/min/{1.73_m2} — ABNORMAL LOW (ref >90)
Glucose: 124 mg/dl (ref 70–140)
Potassium: 4.1 mEq/L (ref 3.5–5.1)
Sodium: 138 mEq/L (ref 136–145)
Total Bilirubin: 0.34 mg/dL (ref 0.20–1.20)
Total Protein: 7.6 g/dL (ref 6.4–8.3)

## 2014-09-06 MED ORDER — SODIUM CHLORIDE 0.9 % IV SOLN
Freq: Once | INTRAVENOUS | Status: AC
Start: 2014-09-06 — End: 2014-09-06
  Administered 2014-09-06: 10:00:00 via INTRAVENOUS

## 2014-09-06 MED ORDER — HEPARIN SOD (PORK) LOCK FLUSH 100 UNIT/ML IV SOLN
500.0000 [IU] | Freq: Once | INTRAVENOUS | Status: AC
  Administered 2014-09-06: 250 [IU] via INTRAVENOUS
  Filled 2014-09-06: qty 5

## 2014-09-06 MED ORDER — SODIUM CHLORIDE 0.9 % IJ SOLN
10.0000 mL | INTRAMUSCULAR | Status: DC | PRN
  Filled 2014-09-06: qty 10

## 2014-09-06 MED ORDER — HEPARIN SOD (PORK) LOCK FLUSH 100 UNIT/ML IV SOLN
500.0000 [IU] | Freq: Once | INTRAVENOUS | Status: AC | PRN
  Administered 2014-09-06: 250 [IU]
  Filled 2014-09-06: qty 5

## 2014-09-06 MED ORDER — SODIUM CHLORIDE 0.9 % IJ SOLN
10.0000 mL | INTRAMUSCULAR | Status: DC | PRN
  Administered 2014-09-06: 10 mL via INTRAVENOUS
  Filled 2014-09-06: qty 10

## 2014-09-06 NOTE — Assessment & Plan Note (Signed)
Clinically, she appeared mildly dehydrated. I recommend IV fluids and she agreed to proceed.

## 2014-09-06 NOTE — Progress Notes (Signed)
[place] OFFICE PROGRESS NOTE  Patient Care Team: Gaynelle Arabian, MD as PCP - General (Family Medicine) Leota Sauers, RN as Oncology Nurse Lorton, RD as Dietitian (Nutrition) Eppie Gibson, MD as Attending Physician (Radiation Oncology)  SUMMARY OF ONCOLOGIC HISTORY: Oncology History   Cancer of base of tongue   Staging form: Lip and Oral Cavity, AJCC 7th Edition     Clinical: Stage IVA (T3, N2b, M0) - Signed by Heath Lark, MD on 05/09/2014       Cancer of base of tongue   04/20/2014 Imaging CT neck showed advanced base of tongue mass and multiple right neck lymphadenopathy   04/23/2014 Pathology Results [id number] FNA of right neck is positive for squamous cell cancer, P16 positive   05/09/2014 Imaging PET/CT scan confirmed base of tongue cancer along with lymph node metastasis.   05/10/2014 Procedure She has placement of PICC line.   05/11/2014 - 06/26/2014 Chemotherapy She received 3 cycles of induction chemotherapy with Taxotere, cisplatin and 5-FU.   05/19/2014 - 05/22/2014 Hospital Admission She was admitted to the hospital for management of neutropenic fever, GI bleed requiring blood transfusion and diarrhea from chemotherapy   07/19/2014 Imaging Repeat PET/CT scan showed complete response to treatment   08/07/2014 -  Radiation Therapy She received radiation treatment.    INTERVAL HISTORY: Please see below for problem oriented charting. She is seen as part of her supportive care visit. She is experiencing more side effects of radiation with mild skin injury/pain and difficulties with oral fluid intake. She felt dehydrated.  REVIEW OF SYSTEMS:   Constitutional: Denies fevers, chills or abnormal weight loss Eyes: Denies blurriness of vision Ears, nose, mouth, throat, and face: Denies sore throat Respiratory: Denies cough, dyspnea or wheezes Cardiovascular: Denies palpitation, chest discomfort or lower extremity swelling Gastrointestinal:   Denies nausea, heartburn or change in bowel habits Lymphatics: Denies new lymphadenopathy or easy bruising Neurological:Denies numbness, tingling or new weaknesses Behavioral/Psych: Mood is stable, no new changes  All other systems were reviewed with the patient and are negative.  I have reviewed the past medical history, past surgical history, social history and family history with the patient and they are unchanged from previous note.  ALLERGIES:  is allergic to penicillins.  MEDICATIONS:  Current Outpatient Prescriptions  Medication Sig Dispense Refill  . ALPRAZolam (XANAX) 0.25 MG tablet Take 0.25 mg by mouth as needed for anxiety.    . Alum & Mag Hydroxide-Simeth (MAGIC MOUTHWASH) SOLN Take 5 mLs by mouth 4 (four) times daily as needed for mouth pain. 500 mL 6  . amLODipine (NORVASC) 10 MG tablet Take 10 mg by mouth daily.    Marland Kitchen aspirin 81 MG tablet Take 81 mg by mouth daily.    Marland Kitchen emollient (BIAFINE) cream Apply topically 2 (two) times daily.    . fluconazole (DIFLUCAN) 100 MG tablet Take 2 tablets today, then 1 tablet daily x 20 more days. Hold atorvastatin while on this drug. 22 tablet 0  . metFORMIN (GLUCOPHAGE) 500 MG tablet Take 500 mg by mouth daily with breakfast.    . nystatin (MYCOSTATIN) powder Apply topically 4 (four) times daily. 30 g 0  . Prenatal Vit-Fe Fumarate-FA (MULTIVITAMIN-PRENATAL) 27-0.8 MG TABS tablet Take 1 tablet by mouth daily at 12 noon.    . Sodium Chloride Flush 0.9 % SOLN injection Inject 10 mLs into the vein daily. 60 Syringe 3  . sodium fluoride (FLUORISHIELD) 1.1 % GEL dental gel Instill one drop of gel per  tooth space of fluoride tray. Place over teeth for 5 minutes. Remove. Spit out excess. Repeat nightly. 120 mL prn  . atorvastatin (LIPITOR) 10 MG tablet Take 10 mg by mouth daily.    Marland Kitchen HYDROcodone-acetaminophen (NORCO/VICODIN) 5-325 MG per tablet Take 1 tablet by mouth every 4 (four) hours as needed for moderate pain. (Patient not taking: Reported on  09/05/2014) 60 tablet 0  . ondansetron (ZOFRAN) 8 MG tablet Take 1 tablet (8 mg total) by mouth every 8 (eight) hours as needed (Nausea or vomiting). (Patient not taking: Reported on 09/05/2014) 90 tablet 1  . prochlorperazine (COMPAZINE) 10 MG tablet Take 1 tablet (10 mg total) by mouth every 6 (six) hours as needed (Nausea or vomiting). (Patient not taking: Reported on 09/05/2014) 30 tablet 1   No current facility-administered medications for this visit.   Facility-Administered Medications Ordered in Other Visits  Medication Dose Route Frequency Provider Last Rate Last Dose  . sodium chloride 0.9 % injection 10 mL  10 mL Intracatheter PRN Heath Lark, MD        PHYSICAL EXAMINATION: ECOG PERFORMANCE STATUS: 1 - Symptomatic but completely ambulatory  Filed Vitals:   09/05/14 0831  BP: 124/64  Pulse: 89  Temp: 97.9 F (36.6 C)  Resp: 18   Filed Weights   09/05/14 0831  Weight: 194 lb 12.8 oz (88.361 kg)    GENERAL:alert, no distress and comfortable SKIN: skin color, texture, turgor are normal, no rashes or significant lesions EYES: normal, Conjunctiva are pink and non-injected, sclera clear OROPHARYNX: She has signs of dehydration with dry mucous membrane and mucositis. No thrush. NECK: supple, thyroid normal size, non-tender, without nodularity LYMPH:  no palpable lymphadenopathy in the cervical, axillary or inguinal LUNGS: clear to auscultation and percussion with normal breathing effort HEART: regular rate & rhythm and no murmurs and no lower extremity edema ABDOMEN:abdomen soft, non-tender and normal bowel sounds Musculoskeletal:no cyanosis of digits and no clubbing  NEURO: alert & oriented x 3 with fluent speech, no focal motor/sensory deficits  LABORATORY DATA:  I have reviewed the data as listed    Component Value Date/Time   NA 138 09/06/2014 0910   NA 138 05/22/2014 0445   K 4.1 09/06/2014 0910   K 3.0* 05/22/2014 0445   CL 101 05/22/2014 0445   CO2 27 09/06/2014  0910   CO2 26 05/22/2014 0445   GLUCOSE 124 09/06/2014 0910   GLUCOSE 144* 05/22/2014 0445   BUN 9.3 09/06/2014 0910   BUN 5* 05/22/2014 0445   CREATININE 0.7 09/06/2014 0910   CREATININE 0.66 05/22/2014 0445   CALCIUM 9.6 09/06/2014 0910   CALCIUM 8.1* 05/22/2014 0445   PROT 7.6 09/06/2014 0910   PROT 5.9* 05/20/2014 0600   ALBUMIN 3.8 09/06/2014 0910   ALBUMIN 2.5* 05/20/2014 0600   AST 17 09/06/2014 0910   AST 10 05/20/2014 0600   ALT 20 09/06/2014 0910   ALT 17 05/20/2014 0600   ALKPHOS 83 09/06/2014 0910   ALKPHOS 67 05/20/2014 0600   BILITOT 0.34 09/06/2014 0910   BILITOT 0.3 05/20/2014 0600   GFRNONAA >90 05/22/2014 0445   GFRAA >90 05/22/2014 0445    No results found for: SPEP, UPEP  Lab Results  Component Value Date   WBC 4.8 09/06/2014   NEUTROABS 3.8 09/06/2014   HGB 12.0 09/06/2014   HCT 36.2 09/06/2014   MCV 93.8 09/06/2014   PLT 188 09/06/2014      Chemistry      Component Value Date/Time  NA 138 09/06/2014 0910   NA 138 05/22/2014 0445   K 4.1 09/06/2014 0910   K 3.0* 05/22/2014 0445   CL 101 05/22/2014 0445   CO2 27 09/06/2014 0910   CO2 26 05/22/2014 0445   BUN 9.3 09/06/2014 0910   BUN 5* 05/22/2014 0445   CREATININE 0.7 09/06/2014 0910   CREATININE 0.66 05/22/2014 0445      Component Value Date/Time   CALCIUM 9.6 09/06/2014 0910   CALCIUM 8.1* 05/22/2014 0445   ALKPHOS 83 09/06/2014 0910   ALKPHOS 67 05/20/2014 0600   AST 17 09/06/2014 0910   AST 10 05/20/2014 0600   ALT 20 09/06/2014 0910   ALT 17 05/20/2014 0600   BILITOT 0.34 09/06/2014 0910   BILITOT 0.3 05/20/2014 0600      ASSESSMENT & PLAN:  Cancer of base of tongue She is tolerating treatment well but with worsening side effects associated with dehydration. I recommend IV fluids this week and IV fluids 3 times weekly starting next week. Continue supportive care.     Dehydration Clinically, she appeared mildly dehydrated. I recommend IV fluids and she agreed to  proceed.   Radiation-induced dermatitis She has mild worsening radiation-induced skin dermatitis. I recommend topical emollient cream. We will monitored carefully.   Mucositis due to radiation therapy She had bowel worsening mucositis. I recommend she takes pain medicine as needed. Recommend IV fluids and she agreed to proceed. I also refilled her Magic mouthwash prescription.       All questions were answered. The patient knows to call the clinic with any problems, questions or concerns. No barriers to learning was detected. I spent 25 minutes counseling the patient face to face. The total time spent in the appointment was 30 minutes and more than 50% was on counseling and review of test results     [facility], [place], MD 09/06/2014 11:19 AM

## 2014-09-06 NOTE — Assessment & Plan Note (Signed)
She had bowel worsening mucositis. I recommend she takes pain medicine as needed. Recommend IV fluids and she agreed to proceed. I also refilled her Magic mouthwash prescription.

## 2014-09-06 NOTE — Assessment & Plan Note (Signed)
She has mild worsening radiation-induced skin dermatitis. I recommend topical emollient cream. We will monitored carefully.

## 2014-09-06 NOTE — Patient Instructions (Signed)

## 2014-09-06 NOTE — Assessment & Plan Note (Signed)
She is tolerating treatment well but with worsening side effects associated with dehydration. I recommend IV fluids this week and IV fluids 3 times weekly starting next week. Continue supportive care.

## 2014-09-06 NOTE — Patient Instructions (Signed)
PICC Home Guide A peripherally inserted central catheter (PICC) is a long, thin, flexible tube that is inserted into a vein in the upper arm. It is a form of intravenous (IV) access. It is considered to be a "central" line because the tip of the PICC ends in a large vein in your chest. This large vein is called the superior vena cava (SVC). The PICC tip ends in the SVC because there is a lot of blood flow in the SVC. This allows medicines and IV fluids to be quickly distributed throughout the body. The PICC is inserted using a sterile technique by a specially trained nurse or physician. After the PICC is inserted, a chest X-ray exam is done to be sure it is in the correct place.  A PICC may be placed for different reasons, such as:  To give medicines and liquid nutrition that can only be given through a central line. Examples are:  Certain antibiotic treatments.  Chemotherapy.  Total parenteral nutrition (TPN).  To take frequent blood samples.  To give IV fluids and blood products.  If there is difficulty placing a peripheral intravenous (PIV) catheter. If taken care of properly, a PICC can remain in place for several months. A PICC can also allow a person to go home from the hospital early. Medicine and PICC care can be managed at home by a family member or home health care team. WHAT PROBLEMS CAN HAPPEN WHEN I HAVE A PICC? Problems with a PICC can occasionally occur. These may include the following:  A blood clot (thrombus) forming in or at the tip of the PICC. This can cause the PICC to become clogged. A clot-dissolving medicine called tissue plasminogen activator (tPA) can be given through the PICC to help break up the clot.  Inflammation of the vein (phlebitis) in which the PICC is placed. Signs of inflammation may include redness, pain at the insertion site, red streaks, or being able to feel a "cord" in the vein where the PICC is located.  Infection in the PICC or at the insertion  site. Signs of infection may include fever, chills, redness, swelling, or pus drainage from the PICC insertion site.  PICC movement (malposition). The PICC tip may move from its original position due to excessive physical activity, forceful coughing, sneezing, or vomiting.  A break or cut in the PICC. It is important to not use scissors near the PICC.  Nerve or tendon irritation or injury during PICC insertion. WHAT SHOULD I KEEP IN MIND ABOUT ACTIVITIES WHEN I HAVE A PICC?  You may bend your arm and move it freely. If your PICC is near or at the bend of your elbow, avoid activity with repeated motion at the elbow.  Rest at home for the remainder of the day following PICC line insertion.  Avoid lifting heavy objects as instructed by your health care provider.  Avoid using a crutch with the arm on the same side as your PICC. You may need to use a walker. WHAT SHOULD I KNOW ABOUT MY PICC DRESSING?  Keep your PICC bandage (dressing) clean and dry to prevent infection.  Ask your health care provider when you may shower. Ask your health care provider to teach you how to wrap the PICC when you do take a shower.  Change the PICC dressing as instructed by your health care provider.  Change your PICC dressing if it becomes loose or wet. WHAT SHOULD I KNOW ABOUT PICC CARE?  Check the PICC insertion site   daily for leakage, redness, swelling, or pain.  Do not take a bath, swim, or use hot tubs when you have a PICC. Cover PICC line with clear plastic wrap and tape to keep it dry while showering.  Flush the PICC as directed by your health care provider. Let your health care provider know right away if the PICC is difficult to flush or does not flush. Do not use force to flush the PICC.  Do not use a syringe that is less than 10 mL to flush the PICC.  Never pull or tug on the PICC.  Avoid blood pressure checks on the arm with the PICC.  Keep your PICC identification card with you at all  times.  Do not take the PICC out yourself. Only a trained clinical professional should remove the PICC. SEEK IMMEDIATE MEDICAL CARE IF:  Your PICC is accidentally pulled all the way out. If this happens, cover the insertion site with a bandage or gauze dressing. Do not throw the PICC away. Your health care provider will need to inspect it.  Your PICC was tugged or pulled and has partially come out. Do not  push the PICC back in.  There is any type of drainage, redness, or swelling where the PICC enters the skin.  You cannot flush the PICC, it is difficult to flush, or the PICC leaks around the insertion site when it is flushed.  You hear a "flushing" sound when the PICC is flushed.  You have pain, discomfort, or numbness in your arm, shoulder, or jaw on the same side as the PICC.  You feel your heart "racing" or skipping beats.  You notice a hole or tear in the PICC.  You develop chills or a fever. MAKE SURE YOU:   Understand these instructions.  Will watch your condition.  Will get help right away if you are not doing well or get worse. Document Released: 12/13/2002 Document Revised: 10/23/2013 Document Reviewed: 02/13/2013 Exit[employer] Patient Information 2015 [employer]. This information is not intended to replace advice given to you by your health care provider. Make sure you discuss any questions you have with your health care provider.  

## 2014-09-07 ENCOUNTER — Ambulatory Visit
Admission: RE | Admit: 2014-09-07 | Discharge: 2014-09-07 | Disposition: A | Discharge status: Home / Self Care | Payer: BLUE CROSS/BLUE SHIELD | Source: Ambulatory Visit | Attending: Radiation Oncology | Admitting: Radiation Oncology

## 2014-09-07 ENCOUNTER — Ambulatory Visit (HOSPITAL_BASED_OUTPATIENT_CLINIC_OR_DEPARTMENT_OTHER): Payer: BLUE CROSS/BLUE SHIELD

## 2014-09-07 ENCOUNTER — Ambulatory Visit: Payer: Self-pay

## 2014-09-07 DIAGNOSIS — Z510 Encounter for antineoplastic radiation therapy: Secondary | ICD-10-CM | POA: Diagnosis not present

## 2014-09-07 DIAGNOSIS — C01 Malignant neoplasm of base of tongue: Secondary | ICD-10-CM

## 2014-09-07 DIAGNOSIS — E860 Dehydration: Secondary | ICD-10-CM

## 2014-09-07 DIAGNOSIS — K1231 Oral mucositis (ulcerative) due to antineoplastic therapy: Secondary | ICD-10-CM

## 2014-09-07 MED ORDER — SODIUM CHLORIDE 0.9 % IJ SOLN
10.0000 mL | INTRAMUSCULAR | Status: DC | PRN
  Administered 2014-09-07: 10 mL
  Filled 2014-09-07: qty 10

## 2014-09-07 MED ORDER — HEPARIN SOD (PORK) LOCK FLUSH 100 UNIT/ML IV SOLN
500.0000 [IU] | Freq: Once | INTRAVENOUS | Status: AC | PRN
  Administered 2014-09-07: 500 [IU]
  Filled 2014-09-07: qty 5

## 2014-09-07 MED ORDER — SODIUM CHLORIDE 0.9 % IV SOLN
Freq: Once | INTRAVENOUS | Status: AC
  Administered 2014-09-07: 09:00:00 via INTRAVENOUS

## 2014-09-07 NOTE — Patient Instructions (Signed)

## 2014-09-10 ENCOUNTER — Encounter: Payer: Self-pay | Admitting: Radiation Oncology

## 2014-09-10 ENCOUNTER — Ambulatory Visit
Admission: RE | Admit: 2014-09-10 | Discharge: 2014-09-10 | Disposition: A | Discharge status: Home / Self Care | Payer: BLUE CROSS/BLUE SHIELD | Source: Ambulatory Visit | Attending: Radiation Oncology | Admitting: Radiation Oncology

## 2014-09-10 ENCOUNTER — Ambulatory Visit: Payer: BLUE CROSS/BLUE SHIELD | Admitting: Nutrition

## 2014-09-10 ENCOUNTER — Ambulatory Visit (HOSPITAL_BASED_OUTPATIENT_CLINIC_OR_DEPARTMENT_OTHER): Payer: BLUE CROSS/BLUE SHIELD

## 2014-09-10 VITALS — BP 118/71 | HR 88 | Temp 97.7°F | Resp 20 | Wt 193.5 lb

## 2014-09-10 DIAGNOSIS — C01 Malignant neoplasm of base of tongue: Secondary | ICD-10-CM

## 2014-09-10 DIAGNOSIS — K1231 Oral mucositis (ulcerative) due to antineoplastic therapy: Secondary | ICD-10-CM

## 2014-09-10 DIAGNOSIS — Z510 Encounter for antineoplastic radiation therapy: Secondary | ICD-10-CM | POA: Diagnosis not present

## 2014-09-10 DIAGNOSIS — E860 Dehydration: Secondary | ICD-10-CM

## 2014-09-10 MED ORDER — HEPARIN SOD (PORK) LOCK FLUSH 100 UNIT/ML IV SOLN
250.0000 [IU] | Freq: Once | INTRAVENOUS | Status: AC | PRN
Start: 2014-09-10 — End: 2014-09-10
  Administered 2014-09-10: 250 [IU]
  Filled 2014-09-10: qty 5

## 2014-09-10 MED ORDER — SODIUM CHLORIDE 0.9 % IJ SOLN
10.0000 mL | INTRAMUSCULAR | Status: DC | PRN
  Administered 2014-09-10: 10 mL
  Filled 2014-09-10: qty 10

## 2014-09-10 MED ORDER — SODIUM CHLORIDE 0.9 % IV SOLN
500.0000 mL | Freq: Once | INTRAVENOUS | Status: AC
  Administered 2014-09-10: 09:00:00 via INTRAVENOUS

## 2014-09-10 NOTE — Patient Instructions (Signed)

## 2014-09-10 NOTE — Progress Notes (Signed)
Nutrition follow-up completed with patient and husband during IV fluids. Patient continues radiation therapy to be completed on April 4. Weight decreased and documented as 193.5 pounds March 21, down from 196.1 pounds March 13. Patient continues to report food has no taste. She continues to have thick saliva. She describes painful swallowing but pain medication helps. States she feels better since receiving IV fluids. Patient was given samples of clear liquid supplements at last visit but she has not tried these.   She appears to be consuming two meals plus one protein drink daily.  Nutrition diagnosis: Inadequate oral intake continues.  Intervention: Recommended patient increase oral nutrition supplements twice a day. Recommended patient try clear liquid supplements and determine if these are painful to swallow. If tolerated, recommended patient try prune juice to help reduce constipation. Reviewed soft, moist, high-protein foods.  Provided a fact sheet. Questions were answered and teach back method used.  Monitoring, evaluation, goals: Patient will tolerate increased calories and protein to minimize further weight loss.  Next visit: Friday, March 25, during IV fluids.  **Disclaimer: This note was dictated with voice recognition software. Similar sounding words can inadvertently be transcribed and this note may contain transcription errors which may not have been corrected upon publication of note.**

## 2014-09-10 NOTE — Progress Notes (Signed)
Patient reports constipation. She is taking Senna 2 tabs daily. Advised she begin stool softeners as well, continue drinking fluids well. Pt verbalized understanding.

## 2014-09-10 NOTE — Progress Notes (Signed)
Patient denies pain except when swallowing. She is taking Hydrocodone twice daily to help control pain while eating. She is eating soft foods, drinks one protein drink daily, adds Ensure. She states she stopped Sucralfate due to it "strangling her because it is grainy". She lost just over 1 lb in past week. She is receiving IVF three times weekly.  She uses MMW, water/salt/soda to manage thick saliva. She states protein drinks make her saliva thicker; encouraged her to discuss other sources of protein with nutritionist today. She is applying Biafine to neck treatment area for erythema, no desquamation noted today.  She continues to take Diflucan, not signs of thrush on her tongue today. She is applying Nystatin powder on yeast under right breast/side and states this area is improving. She continues to be fatigued, states appetite fair.  BP 118/71 mmHg  Pulse 88  Temp(Src) 97.7 F (36.5 C)  Resp 20  Wt 193 lb 8 oz (87.771 kg)

## 2014-09-10 NOTE — Addendum Note (Signed)
Encounter addended by: Minna Antis, RN on: 09/10/2014  9:22 AM<BR>     Documentation filed: Notes Section

## 2014-09-10 NOTE — Progress Notes (Signed)
Weekly Management Note:  Outpatient    ICD-9-CM ICD-10-CM   1. Cancer of base of tongue 141.0 C01     Current Dose:  50 Gy  Projected Dose: 70 Gy   Narrative:  The patient presents for routine under treatment assessment.  CBCT/MVCT images/Port film x-rays were reviewed.  The chart was checked. Patient denies pain except when swallowing. She is taking Hydrocodone twice daily to help control pain while eating. She is eating soft foods, drinks one protein drink daily, adds Ensure. She states she stopped Sucralfate due to it "strangling her because it is grainy". She lost just over 1 lb in past week. She is receiving IVF three times weekly.  She uses MMW, water/salt/soda to manage thick saliva. She states protein drinks make her saliva thicker; encouraged her to discuss other sources of protein with nutritionist today. She is applying Biafine to neck treatment area for erythema, no desquamation noted today.  She continues to take Diflucan, not signs of thrush on her tongue today. She is applying Nystatin powder on yeast under right breast/side and states this area is improving. She continues to be fatigued, states appetite fair.  Pain well controlled with hydrocodone.   BP 118/71 mmHg  Pulse 88  Temp(Src) 97.7 F (36.5 C)  Resp 20  Wt 193 lb 8 oz (87.771 kg)  Physical Findings:  Wt Readings from Last 3 Encounters:  09/05/14 194 lb 12.8 oz (88.361 kg)  08/15/14 206 lb 1.6 oz (93.486 kg)  07/20/14 209 lb 4.8 oz (94.938 kg)    weight is 193 lb 8 oz (87.771 kg). Her temperature is 97.7 F (36.5 C). Her blood pressure is 118/71 and her pulse is 88. Her respiration is 20.   mucosa is erythematous in throat.  No Thrush.  Skin dry and erythematous/pigmented  CBC    Component Value Date/Time   WBC 4.8 09/06/2014 0910   WBC 14.2* 05/22/2014 0445   RBC 3.86 09/06/2014 0910   RBC 2.58* 05/22/2014 0445   HGB 12.0 09/06/2014 0910   HGB 7.7* 05/22/2014 0445   HCT 36.2 09/06/2014 0910   HCT  22.6* 05/22/2014 0445   PLT 188 09/06/2014 0910   PLT 111* 05/22/2014 0445   MCV 93.8 09/06/2014 0910   MCV 87.6 05/22/2014 0445   MCH 31.0 09/06/2014 0910   MCH 29.8 05/22/2014 0445   MCHC 33.1 09/06/2014 0910   MCHC 34.1 05/22/2014 0445   RDW 12.4 09/06/2014 0910   RDW 13.8 05/22/2014 0445   LYMPHSABS 0.4* 09/06/2014 0910   LYMPHSABS 3.3 05/22/2014 0445   MONOABS 0.4 09/06/2014 0910   MONOABS 2.1* 05/22/2014 0445   EOSABS 0.2 09/06/2014 0910   EOSABS 0.1 05/22/2014 0445   BASOSABS 0.0 09/06/2014 0910   BASOSABS 0.0 05/22/2014 0445     CMP     Component Value Date/Time   NA 138 09/06/2014 0910   NA 138 05/22/2014 0445   K 4.1 09/06/2014 0910   K 3.0* 05/22/2014 0445   CL 101 05/22/2014 0445   CO2 27 09/06/2014 0910   CO2 26 05/22/2014 0445   GLUCOSE 124 09/06/2014 0910   GLUCOSE 144* 05/22/2014 0445   BUN 9.3 09/06/2014 0910   BUN 5* 05/22/2014 0445   CREATININE 0.7 09/06/2014 0910   CREATININE 0.66 05/22/2014 0445   CALCIUM 9.6 09/06/2014 0910   CALCIUM 8.1* 05/22/2014 0445   PROT 7.6 09/06/2014 0910   PROT 5.9* 05/20/2014 0600   ALBUMIN 3.8 09/06/2014 0910   ALBUMIN 2.5*  05/20/2014 0600   AST 17 09/06/2014 0910   AST 10 05/20/2014 0600   ALT 20 09/06/2014 0910   ALT 17 05/20/2014 0600   ALKPHOS 83 09/06/2014 0910   ALKPHOS 67 05/20/2014 0600   BILITOT 0.34 09/06/2014 0910   BILITOT 0.3 05/20/2014 0600   GFRNONAA >90 05/22/2014 0445   GFRAA >90 05/22/2014 0445     Impression:  The patient is tolerating radiotherapy.  Weight loss of 1lb in 1 week.    Plan:  Continue radiotherapy as planned.  Cont IVF per med/onc. Continue pushing PO, weight is stabilizing.  Hydrocodone RX for pain working well.    -----------------------------------  Eppie Gibson, MD

## 2014-09-11 ENCOUNTER — Ambulatory Visit
Admission: RE | Admit: 2014-09-11 | Discharge: 2014-09-11 | Disposition: A | Discharge status: Home / Self Care | Payer: BLUE CROSS/BLUE SHIELD | Source: Ambulatory Visit | Attending: Radiation Oncology | Admitting: Radiation Oncology

## 2014-09-11 ENCOUNTER — Telehealth: Payer: Self-pay | Admitting: *Deleted

## 2014-09-11 DIAGNOSIS — Z510 Encounter for antineoplastic radiation therapy: Secondary | ICD-10-CM | POA: Diagnosis not present

## 2014-09-11 NOTE — Telephone Encounter (Signed)
Patient called on 09/07/14 @ 11:28 am.  She reported that she took hydrocodone yesterday after our discussion in Infusion.  She reported that pain was better managed, she was better able to eat, drink and sleep.  She expressed appreciation for my support and guidance.  [name], RN, BSN, [place] at [place] [phone]

## 2014-09-11 NOTE — Progress Notes (Signed)
Met with patient in Infusion where she was receiving IVF. 1. I addressed her questions about what she can expect re: SEs during the upcoming weeks. 2. She reported sore throat, some difficulty swallowing.  She reported using MMW.  She stated she is not taking hydrocodone for pain b/c "I don't want to be addicted".  We discussed importance of pain mgt with prescribed medications, that she will not become addicted to narcotics when taken as prescribed.  She verbalized understanding.  [name], RN, BSN, [place] at [place] [phone]

## 2014-09-12 ENCOUNTER — Telehealth: Payer: Self-pay | Admitting: Hematology and Oncology

## 2014-09-12 ENCOUNTER — Ambulatory Visit
Admission: RE | Admit: 2014-09-12 | Discharge: 2014-09-12 | Disposition: A | Discharge status: Home / Self Care | Payer: BLUE CROSS/BLUE SHIELD | Source: Ambulatory Visit | Attending: Radiation Oncology | Admitting: Radiation Oncology

## 2014-09-12 ENCOUNTER — Ambulatory Visit (HOSPITAL_BASED_OUTPATIENT_CLINIC_OR_DEPARTMENT_OTHER): Payer: BLUE CROSS/BLUE SHIELD | Admitting: Hematology and Oncology

## 2014-09-12 ENCOUNTER — Telehealth: Payer: Self-pay | Admitting: *Deleted

## 2014-09-12 ENCOUNTER — Encounter: Payer: Self-pay | Admitting: Hematology and Oncology

## 2014-09-12 ENCOUNTER — Ambulatory Visit (HOSPITAL_BASED_OUTPATIENT_CLINIC_OR_DEPARTMENT_OTHER): Payer: BLUE CROSS/BLUE SHIELD

## 2014-09-12 VITALS — BP 116/60 | HR 88 | Temp 98.2°F | Resp 20 | Ht 67.0 in | Wt 192.8 lb

## 2014-09-12 DIAGNOSIS — Z510 Encounter for antineoplastic radiation therapy: Secondary | ICD-10-CM | POA: Diagnosis not present

## 2014-09-12 DIAGNOSIS — K1231 Oral mucositis (ulcerative) due to antineoplastic therapy: Secondary | ICD-10-CM | POA: Diagnosis not present

## 2014-09-12 DIAGNOSIS — C01 Malignant neoplasm of base of tongue: Secondary | ICD-10-CM

## 2014-09-12 DIAGNOSIS — L589 Radiodermatitis, unspecified: Secondary | ICD-10-CM

## 2014-09-12 DIAGNOSIS — E860 Dehydration: Secondary | ICD-10-CM

## 2014-09-12 DIAGNOSIS — Z95828 Presence of other vascular implants and grafts: Secondary | ICD-10-CM

## 2014-09-12 DIAGNOSIS — K5909 Other constipation: Secondary | ICD-10-CM

## 2014-09-12 MED ORDER — ALTEPLASE 2 MG IJ SOLR
2.0000 mg | Freq: Once | INTRAMUSCULAR | Status: DC | PRN
  Filled 2014-09-12: qty 2

## 2014-09-12 MED ORDER — HEPARIN SOD (PORK) LOCK FLUSH 100 UNIT/ML IV SOLN
500.0000 [IU] | Freq: Once | INTRAVENOUS | Status: AC | PRN
  Administered 2014-09-12: 500 [IU]
  Filled 2014-09-12: qty 5

## 2014-09-12 MED ORDER — MORPHINE SULFATE (CONCENTRATE) 20 MG/ML PO SOLN: 10.0000 mg | ORAL | Status: DC | PRN

## 2014-09-12 MED ORDER — SODIUM CHLORIDE 0.9 % IV SOLN
1000.0000 mL | Freq: Once | INTRAVENOUS | Status: AC
  Administered 2014-09-12: 1000 mL via INTRAVENOUS

## 2014-09-12 NOTE — Progress Notes (Signed)
[facility] OFFICE PROGRESS NOTE  Patient Care Team: Gaynelle Arabian, MD as PCP - General (Family Medicine) Leota Sauers, RN as Oncology Nurse [place] [facility] [address], RD as Dietitian (Nutrition) Eppie Gibson, MD as Attending Physician (Radiation Oncology)  SUMMARY OF ONCOLOGIC HISTORY: Oncology History   Cancer of base of tongue   Staging form: Lip and Oral Cavity, AJCC 7th Edition     Clinical: Stage IVA (T3, N2b, M0) - Signed by Heath Lark, MD on 05/09/2014       Cancer of base of tongue   04/20/2014 Imaging CT neck showed advanced base of tongue mass and multiple right neck lymphadenopathy   04/23/2014 Pathology Results [id number] FNA of right neck is positive for squamous cell cancer, P16 positive   05/09/2014 Imaging PET/CT scan confirmed base of tongue cancer along with lymph node metastasis.   05/10/2014 Procedure She has placement of PICC line.   05/11/2014 - 06/26/2014 Chemotherapy She received 3 cycles of induction chemotherapy with Taxotere, cisplatin and 5-FU.   05/19/2014 - 05/22/2014 Hospital Admission She was admitted to the hospital for management of neutropenic fever, GI bleed requiring blood transfusion and diarrhea from chemotherapy   07/19/2014 Imaging Repeat PET/CT scan showed complete response to treatment   08/07/2014 -  Radiation Therapy She received radiation treatment.    INTERVAL HISTORY: Please see below for problem oriented charting. She is seen today as part of her weekly supportive care visit. Since last week, the IV fluids has helped tremendously from the point of dehydration. She still has mucositis and some nausea due to production of mucus and gagging. She denies fevers, chills or signs of infection. She is constipated for 4 days. Due to increasing pain, she had difficulty swallowing her pills.  REVIEW OF SYSTEMS:   Constitutional: Denies fevers, chills or abnormal weight loss Eyes: Denies blurriness of vision Respiratory: Denies  cough, dyspnea or wheezes Cardiovascular: Denies palpitation, chest discomfort or lower extremity swelling Skin: Denies abnormal skin rashes Lymphatics: Denies new lymphadenopathy or easy bruising Neurological:Denies numbness, tingling or new weaknesses Behavioral/Psych: Mood is stable, no new changes  All other systems were reviewed with the patient and are negative.  I have reviewed the past medical history, past surgical history, social history and family history with the patient and they are unchanged from previous note.  ALLERGIES:  is allergic to penicillins.  MEDICATIONS:  Current Outpatient Prescriptions  Medication Sig Dispense Refill  . ALPRAZolam (XANAX) 0.25 MG tablet Take 0.25 mg by mouth as needed for anxiety.    . Alum & Mag Hydroxide-Simeth (MAGIC MOUTHWASH) SOLN Take 5 mLs by mouth 4 (four) times daily as needed for mouth pain. 500 mL 6  . amLODipine (NORVASC) 10 MG tablet Take 10 mg by mouth daily.    Marland Kitchen aspirin 81 MG tablet Take 81 mg by mouth daily.    Marland Kitchen atorvastatin (LIPITOR) 10 MG tablet Take 10 mg by mouth daily.    Marland Kitchen emollient (BIAFINE) cream Apply topically 2 (two) times daily.    Marland Kitchen HYDROcodone-acetaminophen (NORCO/VICODIN) 5-325 MG per tablet Take 1 tablet by mouth every 4 (four) hours as needed for moderate pain. 60 tablet 0  . metFORMIN (GLUCOPHAGE) 500 MG tablet Take 500 mg by mouth daily with breakfast.    . nystatin (MYCOSTATIN) powder Apply topically 4 (four) times daily. 30 g 0  . ondansetron (ZOFRAN) 8 MG tablet Take 1 tablet (8 mg total) by mouth every 8 (eight) hours as needed (Nausea or vomiting). [place]  tablet 1  . Prenatal Vit-Fe Fumarate-FA (MULTIVITAMIN-PRENATAL) 27-0.8 MG TABS tablet Take 1 tablet by mouth daily at 12 noon.    . prochlorperazine (COMPAZINE) 10 MG tablet Take 1 tablet (10 mg total) by mouth every 6 (six) hours as needed (Nausea or vomiting). 30 tablet 1  . Sodium Chloride Flush 0.9 % SOLN injection Inject 10 mLs into the vein daily. 60  Syringe 3  . sodium fluoride (FLUORISHIELD) 1.1 % GEL dental gel Instill one drop of gel per tooth space of fluoride tray. Place over teeth for 5 minutes. Remove. Spit out excess. Repeat nightly. 120 mL prn  . sucralfate (CARAFATE) 1 G tablet     . morphine (ROXANOL) 20 MG/ML concentrated solution Take 0.5 mLs (10 mg total) by mouth every 2 (two) hours as needed for severe pain. 240 mL 0   No current facility-administered medications for this visit.    PHYSICAL EXAMINATION: ECOG PERFORMANCE STATUS: 1 - Symptomatic but completely ambulatory  Filed Vitals:   09/12/14 0902  BP: 116/60  Pulse: 88  Temp: 98.2 F (36.8 C)  Resp: 20   Filed Weights   09/12/14 0902  Weight: 192 lb 12.8 oz (87.454 kg)    GENERAL:alert, no distress and comfortable SKIN: Noted radiation-induced dermatitis. No ulceration. EYES: normal, Conjunctiva are pink and non-injected, sclera clear OROPHARYNX noted mild signs of mucositis. No thrush.  NECK: supple, thyroid normal size, non-tender, without nodularity LYMPH:  no palpable lymphadenopathy in the cervical, axillary or inguinal LUNGS: clear to auscultation and percussion with normal breathing effort HEART: regular rate & rhythm and no murmurs and no lower extremity edema ABDOMEN:abdomen soft, non-tender and normal bowel sounds Musculoskeletal:no cyanosis of digits and no clubbing  NEURO: alert & oriented x 3 with fluent speech, no focal motor/sensory deficits  LABORATORY DATA:  I have reviewed the data as listed    Component Value Date/Time   NA 138 09/06/2014 0910   NA 138 05/22/2014 0445   K 4.1 09/06/2014 0910   K 3.0* 05/22/2014 0445   CL 101 05/22/2014 0445   CO2 27 09/06/2014 0910   CO2 26 05/22/2014 0445   GLUCOSE 124 09/06/2014 0910   GLUCOSE 144* 05/22/2014 0445   BUN 9.3 09/06/2014 0910   BUN 5* 05/22/2014 0445   CREATININE 0.7 09/06/2014 0910   CREATININE 0.66 05/22/2014 0445   CALCIUM 9.6 09/06/2014 0910   CALCIUM 8.1* 05/22/2014  0445   PROT 7.6 09/06/2014 0910   PROT 5.9* 05/20/2014 0600   ALBUMIN 3.8 09/06/2014 0910   ALBUMIN 2.5* 05/20/2014 0600   AST 17 09/06/2014 0910   AST 10 05/20/2014 0600   ALT 20 09/06/2014 0910   ALT 17 05/20/2014 0600   ALKPHOS 83 09/06/2014 0910   ALKPHOS 67 05/20/2014 0600   BILITOT 0.34 09/06/2014 0910   BILITOT 0.3 05/20/2014 0600   GFRNONAA >90 05/22/2014 0445   GFRAA >90 05/22/2014 0445    No results found for: SPEP, UPEP  Lab Results  Component Value Date   WBC 4.8 09/06/2014   NEUTROABS 3.8 09/06/2014   HGB 12.0 09/06/2014   HCT 36.2 09/06/2014   MCV 93.8 09/06/2014   PLT 188 09/06/2014      Chemistry      Component Value Date/Time   NA 138 09/06/2014 0910   NA 138 05/22/2014 0445   K 4.1 09/06/2014 0910   K 3.0* 05/22/2014 0445   CL 101 05/22/2014 0445   CO2 27 09/06/2014 0910   CO2 26 05/22/2014  0445   BUN 9.3 09/06/2014 0910   BUN 5* 05/22/2014 0445   CREATININE 0.7 09/06/2014 0910   CREATININE 0.66 05/22/2014 0445      Component Value Date/Time   CALCIUM 9.6 09/06/2014 0910   CALCIUM 8.1* 05/22/2014 0445   ALKPHOS 83 09/06/2014 0910   ALKPHOS 67 05/20/2014 0600   AST 17 09/06/2014 0910   AST 10 05/20/2014 0600   ALT 20 09/06/2014 0910   ALT 17 05/20/2014 0600   BILITOT 0.34 09/06/2014 0910   BILITOT 0.3 05/20/2014 0600      ASSESSMENT & PLAN:  Cancer of base of tongue She is tolerating treatment well but with worsening side effects associated with dehydration. I recommend IV fluids this week and IV fluids 3 times weekly. Continue supportive care.     Mucositis due to chemotherapy She had worsening mucositis. I recommend she takes pain medicine as needed. And I plan to switch her pain medicine to Roxanol for easier absorption. Recommend IV fluids and she agreed to proceed. I recommend she continues Magic mouthwash prescription.     Other constipation She will continue to have problems with constipation. I recommend she increase  the Senokot to 2 tablets 3 times a day and add Mira lax daily.   S/P PICC central line placement She will continue PICC line flushes at home and PICC line dressing change every week. Clinically, it appears to be working well.    Radiation-induced dermatitis She has mild worsening radiation-induced skin dermatitis. I recommend topical emollient cream. We will monitored carefully.   Dehydration Clinically, she appeared mildly dehydrated. I recommend IV fluids and she agreed to proceed. Starting today, I will increase the volume of IV fluids from 500 mL to a liter daily. She found that is helpful.     No orders of the defined types were placed in this encounter.   All questions were answered. The patient knows to call the clinic with any problems, questions or concerns. No barriers to learning was detected. I spent 30 minutes counseling the patient face to face. The total time spent in the appointment was 40 minutes and more than 50% was on counseling and review of test results     [facility], [place], MD 09/12/2014 4:52 PM

## 2014-09-12 NOTE — Patient Instructions (Signed)

## 2014-09-12 NOTE — Assessment & Plan Note (Signed)
She will continue PICC line flushes at home and PICC line dressing change every week. Clinically, it appears to be working well.

## 2014-09-12 NOTE — Assessment & Plan Note (Signed)
Clinically, she appeared mildly dehydrated. I recommend IV fluids and she agreed to proceed. Starting today, I will increase the volume of IV fluids from 500 mL to a liter daily. She found that is helpful.

## 2014-09-12 NOTE — Assessment & Plan Note (Signed)
She has mild worsening radiation-induced skin dermatitis. I recommend topical emollient cream. We will monitored carefully.

## 2014-09-12 NOTE — Telephone Encounter (Signed)
Per staff message and POF I have scheduled appts. Advised scheduler of appts. JMW  

## 2014-09-12 NOTE — Assessment & Plan Note (Signed)
She had worsening mucositis. I recommend she takes pain medicine as needed. And I plan to switch her pain medicine to Roxanol for easier absorption. Recommend IV fluids and she agreed to proceed. I recommend she continues Magic mouthwash prescription.

## 2014-09-12 NOTE — Assessment & Plan Note (Signed)
She will continue to have problems with constipation. I recommend she increase the Senokot to 2 tablets 3 times a day and add Mira lax daily.

## 2014-09-12 NOTE — Telephone Encounter (Signed)
Pt confirmed labs/ov per 03/23 POF, gave pt AVS and Calendar... KJ, sent msg to add chemo °

## 2014-09-12 NOTE — Assessment & Plan Note (Signed)
She is tolerating treatment well but with worsening side effects associated with dehydration. I recommend IV fluids this week and IV fluids 3 times weekly. Continue supportive care.

## 2014-09-13 ENCOUNTER — Ambulatory Visit
Admission: RE | Admit: 2014-09-13 | Discharge: 2014-09-13 | Disposition: A | Discharge status: Home / Self Care | Payer: BLUE CROSS/BLUE SHIELD | Source: Ambulatory Visit | Attending: Radiation Oncology | Admitting: Radiation Oncology

## 2014-09-13 DIAGNOSIS — Z510 Encounter for antineoplastic radiation therapy: Secondary | ICD-10-CM | POA: Diagnosis not present

## 2014-09-14 ENCOUNTER — Ambulatory Visit
Admission: RE | Admit: 2014-09-14 | Discharge: 2014-09-14 | Disposition: A | Discharge status: Home / Self Care | Payer: BLUE CROSS/BLUE SHIELD | Source: Ambulatory Visit | Attending: Radiation Oncology | Admitting: Radiation Oncology

## 2014-09-14 ENCOUNTER — Ambulatory Visit: Payer: BLUE CROSS/BLUE SHIELD | Admitting: Nutrition

## 2014-09-14 ENCOUNTER — Ambulatory Visit (HOSPITAL_BASED_OUTPATIENT_CLINIC_OR_DEPARTMENT_OTHER): Payer: BLUE CROSS/BLUE SHIELD

## 2014-09-14 ENCOUNTER — Ambulatory Visit (HOSPITAL_BASED_OUTPATIENT_CLINIC_OR_DEPARTMENT_OTHER): Payer: BLUE CROSS/BLUE SHIELD | Admitting: Nurse Practitioner

## 2014-09-14 DIAGNOSIS — C01 Malignant neoplasm of base of tongue: Secondary | ICD-10-CM | POA: Diagnosis not present

## 2014-09-14 DIAGNOSIS — Z510 Encounter for antineoplastic radiation therapy: Secondary | ICD-10-CM | POA: Diagnosis not present

## 2014-09-14 DIAGNOSIS — K5900 Constipation, unspecified: Secondary | ICD-10-CM

## 2014-09-14 DIAGNOSIS — K1231 Oral mucositis (ulcerative) due to antineoplastic therapy: Secondary | ICD-10-CM | POA: Diagnosis not present

## 2014-09-14 DIAGNOSIS — E860 Dehydration: Secondary | ICD-10-CM

## 2014-09-14 DIAGNOSIS — R110 Nausea: Secondary | ICD-10-CM

## 2014-09-14 MED ORDER — HEPARIN SOD (PORK) LOCK FLUSH 100 UNIT/ML IV SOLN
250.0000 [IU] | Freq: Once | INTRAVENOUS | Status: AC | PRN
  Administered 2014-09-14: 250 [IU]
  Filled 2014-09-14: qty 5

## 2014-09-14 MED ORDER — SODIUM CHLORIDE 0.9 % IJ SOLN
10.0000 mL | INTRAMUSCULAR | Status: AC | PRN
  Administered 2014-09-14: 10 mL
  Filled 2014-09-14: qty 10

## 2014-09-14 MED ORDER — SODIUM CHLORIDE 0.9 % IV SOLN
1000.0000 mL | Freq: Once | INTRAVENOUS | Status: AC
  Administered 2014-09-14: 09:00:00 via INTRAVENOUS

## 2014-09-14 MED ORDER — SODIUM CHLORIDE 0.9 % IJ SOLN
10.0000 mL | INTRAMUSCULAR | Status: DC | PRN
  Administered 2014-09-14: 10 mL
  Filled 2014-09-14: qty 10

## 2014-09-14 MED ORDER — HEPARIN SOD (PORK) LOCK FLUSH 100 UNIT/ML IV SOLN
250.0000 [IU] | INTRAVENOUS | Status: AC | PRN
Start: 2014-09-14 — End: 2014-09-14
  Administered 2014-09-14: 250 [IU]
  Filled 2014-09-14: qty 5

## 2014-09-14 NOTE — Progress Notes (Signed)
Nutrition follow-up completed with patient and husband during IV fluids. Patient continues radiation therapy to be completed on April 4. Weight decreased and documented as 192.8 pounds March 23, down from 193.5 pounds March 21. Patient reports that she has not had a bowel movement for 7 days.  She has been compliant taking Senokot, and MiraLAX.  However, this has not produced a bowel movement. Patient continues to have thick saliva. Patient continues to have difficulty and painful swallowing.  Nutrition diagnosis: Inadequate oral intake continues.  Intervention:  Referred patient to nurse practitioner who provided recommendations to increase MiraLAX, and try magnesium citrate for constipation. Encouraged patient to continue increased oral intake and nausea medication as needed. Stressed the importance of patient continuing to focus on adequate calories as well as protein to promote healing. Teach back method was used.  Monitoring, evaluation, goals: Patient will work to increase calories and protein to minimize further weight loss.  Next visit: Wednesday, March 30.  **Disclaimer: This note was dictated with voice recognition software. Similar sounding words can inadvertently be transcribed and this note may contain transcription errors which may not have been corrected upon publication of note.**

## 2014-09-14 NOTE — Patient Instructions (Signed)
Dehydration, Adult Dehydration is when you lose more fluids from the body than you take in. Vital organs like the kidneys, brain, and heart cannot function without a proper amount of fluids and salt. Any loss of fluids from the body can cause dehydration.  CAUSES   Vomiting.  Diarrhea.  Excessive sweating.  Excessive urine output.  Fever. SYMPTOMS  Mild dehydration  Thirst.  Dry lips.  Slightly dry mouth. Moderate dehydration  Very dry mouth.  Sunken eyes.  Skin does not bounce back quickly when lightly pinched and released.  Dark urine and decreased urine production.  Decreased tear production.  Headache. Severe dehydration  Very dry mouth.  Extreme thirst.  Rapid, weak pulse (more than 100 beats per minute at rest).  Cold hands and feet.  Not able to sweat in spite of heat and temperature.  Rapid breathing.  Blue lips.  Confusion and lethargy.  Difficulty being awakened.  Minimal urine production.  No tears. DIAGNOSIS  Your caregiver will diagnose dehydration based on your symptoms and your exam. Blood and urine tests will help confirm the diagnosis. The diagnostic evaluation should also identify the cause of dehydration. TREATMENT  Treatment of mild or moderate dehydration can often be done at home by increasing the amount of fluids that you drink. It is best to drink small amounts of fluid more often. Drinking too much at one time can make vomiting worse. Refer to the home care instructions below. Severe dehydration needs to be treated at the hospital where you will probably be given intravenous (IV) fluids that contain water and electrolytes. HOME CARE INSTRUCTIONS   Ask your caregiver about specific rehydration instructions.  Drink enough fluids to keep your urine clear or pale yellow.  Drink small amounts frequently if you have nausea and vomiting.  Eat as you normally do.  Avoid:  Foods or drinks high in sugar.  Carbonated  drinks.  Juice.  Extremely hot or cold fluids.  Drinks with caffeine.  Fatty, greasy foods.  Alcohol.  Tobacco.  Overeating.  Gelatin desserts.  Wash your hands well to avoid spreading bacteria and viruses.  Only take over-the-counter or prescription medicines for pain, discomfort, or fever as directed by your caregiver.  Ask your caregiver if you should continue all prescribed and over-the-counter medicines.  Keep all follow-up appointments with your caregiver. SEEK MEDICAL CARE IF:  You have abdominal pain and it increases or stays in one area (localizes).  You have a rash, stiff neck, or severe headache.  You are irritable, sleepy, or difficult to awaken.  You are weak, dizzy, or extremely thirsty. SEEK IMMEDIATE MEDICAL CARE IF:   You are unable to keep fluids down or you get worse despite treatment.  You have frequent episodes of vomiting or diarrhea.  You have blood or green matter (bile) in your vomit.  You have blood in your stool or your stool looks black and tarry.  You have not urinated in 6 to 8 hours, or you have only urinated a small amount of very dark urine.  You have a fever.  You faint. MAKE SURE YOU:   Understand these instructions.  Will watch your condition.  Will get help right away if you are not doing well or get worse. Document Released: 06/08/2005 Document Revised: 08/31/2011 Document Reviewed: 01/26/2011 ExitCare Patient Information 2015 [employer]. This information is not intended to replace advice given to you by your health care provider. Make sure you discuss any questions you have with your health care   provider.  

## 2014-09-17 ENCOUNTER — Ambulatory Visit (HOSPITAL_BASED_OUTPATIENT_CLINIC_OR_DEPARTMENT_OTHER): Payer: BLUE CROSS/BLUE SHIELD

## 2014-09-17 ENCOUNTER — Ambulatory Visit: Payer: BLUE CROSS/BLUE SHIELD | Attending: Radiation Oncology

## 2014-09-17 ENCOUNTER — Ambulatory Visit: Payer: Self-pay

## 2014-09-17 ENCOUNTER — Encounter: Payer: Self-pay | Admitting: Radiation Oncology

## 2014-09-17 ENCOUNTER — Ambulatory Visit
Admission: RE | Admit: 2014-09-17 | Discharge: 2014-09-17 | Disposition: A | Discharge status: Home / Self Care | Payer: BLUE CROSS/BLUE SHIELD | Source: Ambulatory Visit | Attending: Radiation Oncology | Admitting: Radiation Oncology

## 2014-09-17 DIAGNOSIS — C01 Malignant neoplasm of base of tongue: Secondary | ICD-10-CM | POA: Diagnosis not present

## 2014-09-17 DIAGNOSIS — L599 Disorder of the skin and subcutaneous tissue related to radiation, unspecified: Secondary | ICD-10-CM | POA: Insufficient documentation

## 2014-09-17 DIAGNOSIS — K1231 Oral mucositis (ulcerative) due to antineoplastic therapy: Secondary | ICD-10-CM | POA: Diagnosis not present

## 2014-09-17 DIAGNOSIS — R110 Nausea: Secondary | ICD-10-CM | POA: Insufficient documentation

## 2014-09-17 DIAGNOSIS — K5900 Constipation, unspecified: Secondary | ICD-10-CM | POA: Insufficient documentation

## 2014-09-17 DIAGNOSIS — Z5189 Encounter for other specified aftercare: Secondary | ICD-10-CM | POA: Insufficient documentation

## 2014-09-17 DIAGNOSIS — Z510 Encounter for antineoplastic radiation therapy: Secondary | ICD-10-CM | POA: Diagnosis present

## 2014-09-17 DIAGNOSIS — M436 Torticollis: Secondary | ICD-10-CM | POA: Insufficient documentation

## 2014-09-17 DIAGNOSIS — K1233 Oral mucositis (ulcerative) due to radiation: Secondary | ICD-10-CM | POA: Diagnosis not present

## 2014-09-17 MED ORDER — BIAFINE EX EMUL
Freq: Two times a day (BID) | CUTANEOUS | Status: DC
  Administered 2014-09-17: 10:00:00 via TOPICAL

## 2014-09-17 MED ORDER — HEPARIN SOD (PORK) LOCK FLUSH 100 UNIT/ML IV SOLN
250.0000 [IU] | Freq: Once | INTRAVENOUS | Status: AC | PRN
  Administered 2014-09-17: 250 [IU]
  Filled 2014-09-17: qty 5

## 2014-09-17 MED ORDER — SODIUM CHLORIDE 0.9 % IV SOLN
Freq: Once | INTRAVENOUS | Status: AC
  Administered 2014-09-17: 10:00:00 via INTRAVENOUS

## 2014-09-17 MED ORDER — HEPARIN SOD (PORK) LOCK FLUSH 100 UNIT/ML IV SOLN
500.0000 [IU] | Freq: Once | INTRAVENOUS | Status: DC | PRN
  Filled 2014-09-17: qty 5

## 2014-09-17 MED ORDER — PROMETHAZINE HCL 25 MG/ML IJ SOLN
25.0000 mg | Freq: Once | INTRAMUSCULAR | Status: DC
  Filled 2014-09-17: qty 1

## 2014-09-17 MED ORDER — SODIUM CHLORIDE 0.9 % IJ SOLN
10.0000 mL | INTRAMUSCULAR | Status: DC | PRN
  Administered 2014-09-17: 10 mL
  Filled 2014-09-17: qty 10

## 2014-09-17 MED ORDER — SCOPOLAMINE 1 MG/3DAYS TD PT72: 1.0000 | MEDICATED_PATCH | TRANSDERMAL | Status: DC

## 2014-09-17 NOTE — Patient Instructions (Signed)
Dehydration, Adult Dehydration is when you lose more fluids from the body than you take in. Vital organs like the kidneys, brain, and heart cannot function without a proper amount of fluids and salt. Any loss of fluids from the body can cause dehydration.  CAUSES   Vomiting.  Diarrhea.  Excessive sweating.  Excessive urine output.  Fever. SYMPTOMS  Mild dehydration  Thirst.  Dry lips.  Slightly dry mouth. Moderate dehydration  Very dry mouth.  Sunken eyes.  Skin does not bounce back quickly when lightly pinched and released.  Dark urine and decreased urine production.  Decreased tear production.  Headache. Severe dehydration  Very dry mouth.  Extreme thirst.  Rapid, weak pulse (more than 100 beats per minute at rest).  Cold hands and feet.  Not able to sweat in spite of heat and temperature.  Rapid breathing.  Blue lips.  Confusion and lethargy.  Difficulty being awakened.  Minimal urine production.  No tears. DIAGNOSIS  Your caregiver will diagnose dehydration based on your symptoms and your exam. Blood and urine tests will help confirm the diagnosis. The diagnostic evaluation should also identify the cause of dehydration. TREATMENT  Treatment of mild or moderate dehydration can often be done at home by increasing the amount of fluids that you drink. It is best to drink small amounts of fluid more often. Drinking too much at one time can make vomiting worse. Refer to the home care instructions below. Severe dehydration needs to be treated at the hospital where you will probably be given intravenous (IV) fluids that contain water and electrolytes. HOME CARE INSTRUCTIONS   Ask your caregiver about specific rehydration instructions.  Drink enough fluids to keep your urine clear or pale yellow.  Drink small amounts frequently if you have nausea and vomiting.  Eat as you normally do.  Avoid:  Foods or drinks high in sugar.  Carbonated  drinks.  Juice.  Extremely hot or cold fluids.  Drinks with caffeine.  Fatty, greasy foods.  Alcohol.  Tobacco.  Overeating.  Gelatin desserts.  Wash your hands well to avoid spreading bacteria and viruses.  Only take over-the-counter or prescription medicines for pain, discomfort, or fever as directed by your caregiver.  Ask your caregiver if you should continue all prescribed and over-the-counter medicines.  Keep all follow-up appointments with your caregiver. SEEK MEDICAL CARE IF:  You have abdominal pain and it increases or stays in one area (localizes).  You have a rash, stiff neck, or severe headache.  You are irritable, sleepy, or difficult to awaken.  You are weak, dizzy, or extremely thirsty. SEEK IMMEDIATE MEDICAL CARE IF:   You are unable to keep fluids down or you get worse despite treatment.  You have frequent episodes of vomiting or diarrhea.  You have blood or green matter (bile) in your vomit.  You have blood in your stool or your stool looks black and tarry.  You have not urinated in 6 to 8 hours, or you have only urinated a small amount of very dark urine.  You have a fever.  You faint. MAKE SURE YOU:   Understand these instructions.  Will watch your condition.  Will get help right away if you are not doing well or get worse. Document Released: 06/08/2005 Document Revised: 08/31/2011 Document Reviewed: 01/26/2011 ExitCare Patient Information 2015 [employer]. This information is not intended to replace advice given to you by your health care provider. Make sure you discuss any questions you have with your health care   provider.  

## 2014-09-17 NOTE — Progress Notes (Signed)
    Weekly Management Note:  Outpatient    ICD-9-CM ICD-10-CM   1. Cancer of base of tongue 141.0 C01 scopolamine (TRANSDERM-SCOP) 1 MG/3DAYS    Current Dose:  60 Gy  Projected Dose: 70 Gy   Narrative:  The patient presents for routine under treatment assessment.  CBCT/MVCT images/Port film x-rays were reviewed.  The chart was checked. Main complaint is copious thick saliva and nausea r/t this. Constipation responsive to Miralax   BP 130/60 mmHg  Pulse 89  Temp(Src) 98.1 F (36.7 C) (Oral)  Resp 20  Wt 187 lb 11.2 oz (85.14 kg)  Physical Findings:  Wt Readings from Last 3 Encounters:  09/12/14 192 lb 12.8 oz (87.454 kg)  09/05/14 194 lb 12.8 oz (88.361 kg)  08/15/14 206 lb 1.6 oz (93.486 kg)    weight is 187 lb 11.2 oz (85.14 kg). Her oral temperature is 98.1 F (36.7 C). Her blood pressure is 130/60 and her pulse is 89. Her respiration is 20.   mucosa is erythematous in throat.  No Thrush.  Skin dry and erythematous/pigmented  CBC    Component Value Date/Time   WBC 4.8 09/06/2014 0910   WBC 14.2* 05/22/2014 0445   RBC 3.86 09/06/2014 0910   RBC 2.58* 05/22/2014 0445   HGB 12.0 09/06/2014 0910   HGB 7.7* 05/22/2014 0445   HCT 36.2 09/06/2014 0910   HCT 22.6* 05/22/2014 0445   PLT 188 09/06/2014 0910   PLT 111* 05/22/2014 0445   MCV 93.8 09/06/2014 0910   MCV 87.6 05/22/2014 0445   MCH 31.0 09/06/2014 0910   MCH 29.8 05/22/2014 0445   MCHC 33.1 09/06/2014 0910   MCHC 34.1 05/22/2014 0445   RDW 12.4 09/06/2014 0910   RDW 13.8 05/22/2014 0445   LYMPHSABS 0.4* 09/06/2014 0910   LYMPHSABS 3.3 05/22/2014 0445   MONOABS 0.4 09/06/2014 0910   MONOABS 2.1* 05/22/2014 0445   EOSABS 0.2 09/06/2014 0910   EOSABS 0.1 05/22/2014 0445   BASOSABS 0.0 09/06/2014 0910   BASOSABS 0.0 05/22/2014 0445     CMP     Component Value Date/Time   NA 138 09/06/2014 0910   NA 138 05/22/2014 0445   K 4.1 09/06/2014 0910   K 3.0* 05/22/2014 0445   CL 101 05/22/2014 0445   CO2  27 09/06/2014 0910   CO2 26 05/22/2014 0445   GLUCOSE 124 09/06/2014 0910   GLUCOSE 144* 05/22/2014 0445   BUN 9.3 09/06/2014 0910   BUN 5* 05/22/2014 0445   CREATININE 0.7 09/06/2014 0910   CREATININE 0.66 05/22/2014 0445   CALCIUM 9.6 09/06/2014 0910   CALCIUM 8.1* 05/22/2014 0445   PROT 7.6 09/06/2014 0910   PROT 5.9* 05/20/2014 0600   ALBUMIN 3.8 09/06/2014 0910   ALBUMIN 2.5* 05/20/2014 0600   AST 17 09/06/2014 0910   AST 10 05/20/2014 0600   ALT 20 09/06/2014 0910   ALT 17 05/20/2014 0600   ALKPHOS 83 09/06/2014 0910   ALKPHOS 67 05/20/2014 0600   BILITOT 0.34 09/06/2014 0910   BILITOT 0.3 05/20/2014 0600   GFRNONAA >90 05/22/2014 0445   GFRAA >90 05/22/2014 0445     Impression:  The patient is tolerating radiotherapy.  Weight loss of 5lb in 1 week.    Plan:  Continue radiotherapy as planned.  Cont IVF per med/onc. Continue pushing PO and nutrition appts.  Scopolamine Rx for thick saliva.  -----------------------------------  Eppie Gibson, MD

## 2014-09-17 NOTE — Addendum Note (Signed)
Encounter addended by: Minna Antis, RN on: 09/17/2014  9:36 AM<BR>     Documentation filed: [employer], Inpatient MAR, Orders

## 2014-09-17 NOTE — Progress Notes (Signed)
Patient reports throat pain 8 this morning; she has not taken any pain meds this weekend due to constipation. She did have BM on Friday , is taking Metamucil, stool softeners. Advised she continue with these meds and may increase number of stool softeners. She reports her saliva is thicker, more difficult to manage despite using all advised methods. She states she is getting up very often at night to "spit it out". Advised she elevate her head during sleep. She reprots nausea all weekend due to thick saliva. She is taking Compazine regularly, has not rtied Zofran. Advised she may try Zofran for effectiveness. She is applying Biafine to neck treatment area for erythema, some desquamation in fold of neck on right side. Advised she apply antibiotic ointment in this area. She has lost 5 lbs in past 5 days, eating soft foods, drinking one protein supplement daily. She saw nutritionist last week, next appt 09/19/14. She will receive IVF today.  BP 130/60 mmHg  Pulse 89  Temp(Src) 98.1 F (36.7 C) (Oral)  Resp 20  Wt 187 lb 11.2 oz (85.14 kg)

## 2014-09-18 ENCOUNTER — Encounter: Payer: Self-pay | Admitting: Nurse Practitioner

## 2014-09-18 ENCOUNTER — Other Ambulatory Visit: Payer: Self-pay | Admitting: Hematology and Oncology

## 2014-09-18 ENCOUNTER — Ambulatory Visit
Admission: RE | Admit: 2014-09-18 | Discharge: 2014-09-18 | Disposition: A | Discharge status: Home / Self Care | Payer: BLUE CROSS/BLUE SHIELD | Source: Ambulatory Visit | Attending: Radiation Oncology | Admitting: Radiation Oncology

## 2014-09-18 ENCOUNTER — Telehealth: Payer: Self-pay | Admitting: *Deleted

## 2014-09-18 DIAGNOSIS — K5900 Constipation, unspecified: Secondary | ICD-10-CM | POA: Insufficient documentation

## 2014-09-18 DIAGNOSIS — Z510 Encounter for antineoplastic radiation therapy: Secondary | ICD-10-CM | POA: Diagnosis not present

## 2014-09-18 DIAGNOSIS — R110 Nausea: Secondary | ICD-10-CM | POA: Insufficient documentation

## 2014-09-18 NOTE — Assessment & Plan Note (Signed)
Patient is complaining of some chronic constipation despite using stool softeners twice daily and occasional marrow lacks.  Advised patient to continue with stool softeners twice daily; but to increase the marrow lacks 2 every 4-6 hours until all constipation has cleared.  Also encouraged patient to push fluids and fiber is much as possible.

## 2014-09-18 NOTE — Assessment & Plan Note (Signed)
Chemotherapy-induced nausea appears fairly stable as well.  However, patient does remain slightly dehydrated; and continues with daily IV fluid rehydration here at the cancer center.

## 2014-09-18 NOTE — Telephone Encounter (Signed)
Returned call from Ms. Benning.  She states that she cannot tolerate drinking metamucil due to her sore throat and thickened saliva, and would prefer to take Correctol or Senokot for her constipation induced by taking pain medication. She also reports that she deferred pain medication for a "few days" due to her constipation.  Suggested she take Senna-S since it has a stimulant and softener combined, and to take it at least twice daily while continuing her pain medication to ensure she is getting more effective relief.  After treatment today she resumed her pain meds.  Advised her to update nurses of her status on tomorrow and she stated she would.

## 2014-09-18 NOTE — Progress Notes (Signed)
SYMPTOM MANAGEMENT CLINIC   HPI: [name] 59 y.o. female diagnosed with tongue cancer.  Patient is status post docetaxel/cisplatin/5-FU chemotherapy completed on 06/21/2014.  Currently undergoing radiation therapy.  Patient completed her last cycle of chemotherapy on 06/21/2014.  She is undergoing daily radiation treatments-which will be completed on 09/24/2014.  Patient continues to complain of some chronic mucositis; but feels it is stable at present.  She is using Magic mouthwash as directed.  She is also complaining of chronic low appetite secondary to the mucositis; and subsequent dehydration.  She has been presented to the [facility] on a daily basis for IV fluid rehydration.  She also continues to complain of some chronic nausea; but no vomiting.  She continues with constipation as well.  She states she's been taking stool softeners twice daily and occasional marijuana Lasix.  She denies any recent fevers or chills.   HPI  ROS  Past Medical History  Diagnosis Date  . Hypertension   . Diabetes mellitus without complication     "borderline diabetic"   . Hypercholesteremia   . Diabetes mellitus type 2, controlled, without complications [id number]/04/[id number]  . Cancer of tongue   . Anemia in neoplastic disease 06/01/2014  . GERD (gastroesophageal reflux disease) 06/08/2014  . Cancer     tongue  . Yeast infection of the skin 08/15/2014    Past Surgical History  Procedure Laterality Date  . Cervical ablation      Uterus  . Nasal fracture surgery  [id number]    Dr. Erik Obey    has Cancer of base of tongue; Diabetes mellitus type 2, controlled, without complications; S/P PICC central line placement; Hypertension; Diarrhea; Anemia in neoplastic disease; GERD (gastroesophageal reflux disease); Leukopenia due to antineoplastic chemotherapy; Mucositis due to chemotherapy; Neuropathy due to chemotherapeutic drug; Other constipation; Thyroid nodule; Yeast infection of the skin; Dehydration;  Radiation-induced dermatitis; Mucositis due to radiation therapy; Constipation; and Nausea without vomiting on her problem list.    is allergic to penicillins.    Medication List       This list is accurate as of: 09/14/14 11:59 PM.  Always use your most recent med list.               ALPRAZolam 0.25 MG tablet  Commonly known as:  XANAX  Take 0.25 mg by mouth as needed for anxiety.     amLODipine 10 MG tablet  Commonly known as:  NORVASC  Take 10 mg by mouth daily.     aspirin 81 MG tablet  Take 81 mg by mouth daily.     atorvastatin 10 MG tablet  Commonly known as:  LIPITOR  Take 10 mg by mouth daily.     emollient cream  Commonly known as:  BIAFINE  Apply topically 2 (two) times daily.     HYDROcodone-acetaminophen 5-325 MG per tablet  Commonly known as:  NORCO/VICODIN  Take 1 tablet by mouth every 4 (four) hours as needed for moderate pain.     magic mouthwash Soln  Take 5 mLs by mouth 4 (four) times daily as needed for mouth pain.     metFORMIN 500 MG tablet  Commonly known as:  GLUCOPHAGE  Take 500 mg by mouth daily with breakfast.     morphine 20 MG/ML concentrated solution  Commonly known as:  ROXANOL  Take 0.5 mLs (10 mg total) by mouth every 2 (two) hours as needed for severe pain.     multivitamin-prenatal 27-0.8 MG Tabs tablet  Take  1 tablet by mouth daily at 12 noon.     nystatin powder  Commonly known as:  MYCOSTATIN  Apply topically 4 (four) times daily.     ondansetron 8 MG tablet  Commonly known as:  ZOFRAN  Take 1 tablet (8 mg total) by mouth every 8 (eight) hours as needed (Nausea or vomiting).     prochlorperazine 10 MG tablet  Commonly known as:  COMPAZINE  Take 1 tablet (10 mg total) by mouth every 6 (six) hours as needed (Nausea or vomiting).     Sodium Chloride Flush 0.9 % Soln injection  Inject 10 mLs into the vein daily.     sodium fluoride 1.1 % Gel dental gel  Commonly known as:  FLUORISHIELD  Instill one drop of gel per  tooth space of fluoride tray. Place over teeth for 5 minutes. Remove. Spit out excess. Repeat nightly.     sucralfate 1 G tablet  Commonly known as:  [place]  Oncology Vitals 09/17/2014 09/17/2014 09/14/2014 09/14/2014 09/12/2014 09/12/2014 09/10/2014  Height - - - - - 170 cm -  Weight - 85.14 kg - - - 87.454 kg -  Weight (lbs) - 187 lbs 11 oz - - - 192 lbs 13 oz -  BMI (kg/m2) - - - - - 30.2 kg/m2 -  Temp 98.5 98.1 - 98 - 98.2 -  Pulse 93 89 94 83 76 88 75  Resp 20 20 - 18 - 20 -  SpO2 - - - 100 - 99 -  BSA (m2) - - - - - 2.03 m2 -   BP Readings from Last 3 Encounters:  09/17/14 123/63  09/14/14 105/57  09/12/14 112/53    Physical Exam  Constitutional: She is oriented to person, place, and time. Vital signs are normal. She appears dehydrated. She appears unhealthy.  HENT:  Head: Normocephalic and atraumatic.  Healing oral lesions to both the tongue in the mouth; but no thrush noted.  Eyes: Conjunctivae and EOM are normal. Pupils are equal, round, and reactive to light. Right eye exhibits no discharge. Left eye exhibits no discharge. No scleral icterus.  Neck: Normal range of motion.  Pulmonary/Chest: Effort normal. No respiratory distress.  Musculoskeletal: Normal range of motion.  Neurological: She is alert and oriented to person, place, and time.  Skin: Skin is warm and dry. There is pallor.  Psychiatric: Affect normal.  Nursing note and vitals reviewed.   LABORATORY DATA:. No visits with results within 3 Day(s) from this visit. Latest known visit with results is:  Appointment on 09/06/2014  Component Date Value Ref Range Status  . WBC 09/06/2014 4.8  3.9 - 10.3 10e3/uL Final  . NEUT# 09/06/2014 3.8  1.5 - 6.5 10e3/uL Final  . HGB 09/06/2014 12.0  11.6 - 15.9 g/dL Final  . HCT 09/06/2014 36.2  34.8 - 46.6 % Final  . Platelets 09/06/2014 188  145 - 400 10e3/uL Final  . MCV 09/06/2014 93.8  79.5 - 101.0 fL Final  . MCH 09/06/2014 31.0   25.1 - 34.0 pg Final  . MCHC 09/06/2014 33.1  31.5 - 36.0 g/dL Final  . RBC 09/06/2014 3.86  3.70 - 5.45 10e6/uL Final  . RDW 09/06/2014 12.4  11.2 - 14.5 % Final  . lymph# 09/06/2014 0.4* 0.9 - 3.3 10e3/uL Final  . MONO# 09/06/2014 0.4  0.1 - 0.9 10e3/uL Final  . Eosinophils Absolute 09/06/2014 0.2  0.0 - 0.5 10e3/uL Final  .  Basophils Absolute 09/06/2014 0.0  0.0 - 0.1 10e3/uL Final  . NEUT% 09/06/2014 80.1* 38.4 - 76.8 % Final  . LYMPH% 09/06/2014 8.6* 14.0 - 49.7 % Final  . MONO% 09/06/2014 7.6  0.0 - 14.0 % Final  . EOS% 09/06/2014 3.4  0.0 - 7.0 % Final  . BASO% 09/06/2014 0.3  0.0 - 2.0 % Final  . Sodium 09/06/2014 138  136 - 145 mEq/L Final  . Potassium 09/06/2014 4.1  3.5 - 5.1 mEq/L Final  . Chloride 09/06/2014 103  98 - 109 mEq/L Final  . CO2 09/06/2014 27  22 - 29 mEq/L Final  . Glucose 09/06/2014 124  70 - 140 mg/dl Final  . BUN 09/06/2014 9.3  7.0 - 26.0 mg/dL Final  . Creatinine 09/06/2014 0.7  0.6 - 1.1 mg/dL Final  . Total Bilirubin 09/06/2014 0.34  0.20 - 1.20 mg/dL Final  . Alkaline Phosphatase 09/06/2014 83  40 - 150 U/L Final  . AST 09/06/2014 17  5 - 34 U/L Final  . ALT 09/06/2014 20  0 - 55 U/L Final  . Total Protein 09/06/2014 7.6  6.4 - 8.3 g/dL Final  . Albumin 09/06/2014 3.8  3.5 - 5.0 g/dL Final  . Calcium 09/06/2014 9.6  8.4 - 10.4 mg/dL Final  . Anion Gap 09/06/2014 8  3 - 11 mEq/L Final  . EGFR 09/06/2014 88* >90 ml/min/1.73 m2 Final   eGFR is calculated using the CKD-EPI Creatinine Equation (2009)     RADIOGRAPHIC STUDIES: No results found.  ASSESSMENT/PLAN:    Cancer of base of tongue Patient has completed 3 cycles of induction chemotherapy with docetaxel/cisplatin/5-FU chemotherapy.  Last chemotherapy received was the summer 31st 2015.  She is currently undergoing radiation therapy; and will complete all radiation therapy on 09/24/2014.  She is scheduled to return for labs and a follow-up visit here at the [facility] on  09/19/2014.   Constipation Patient is complaining of some chronic constipation despite using stool softeners twice daily and occasional marrow lacks.  Advised patient to continue with stool softeners twice daily; but to increase the marrow lacks 2 every 4-6 hours until all constipation has cleared.  Also encouraged patient to push fluids and fiber is much as possible.   Dehydration Patient has been suffering with some chronic mucositis secondary to her chemotherapy.  She has had little appetite; and feels dehydrated.  She has been receiving daily IV fluid rehydration while at the cancer center.   Mucositis due to chemotherapy Chemotherapy-induced mucositis continues; but patient states is stable at present.  She continues to use Magic mouthwash as previously directed.   Nausea without vomiting Chemotherapy-induced nausea appears fairly stable as well.  However, patient does remain slightly dehydrated; and continues with daily IV fluid rehydration here at the cancer center.   Patient stated understanding of all instructions; and was in agreement with this plan of care. The patient knows to call the clinic with any problems, questions or concerns.   Review/collaboration with Dr. Alvy Bimler regarding all aspects of patient's visit today.   Total time spent with patient was 15 minutes;  with greater than 75 percent of that time spent in face to face counseling regarding patient's symptoms,  and coordination of care and follow up.  Disclaimer: This note was dictated with voice recognition software. Similar sounding words can inadvertently be transcribed and may not be corrected upon review.   Drue Second, NP 09/18/2014

## 2014-09-18 NOTE — Assessment & Plan Note (Signed)
Patient has been suffering with some chronic mucositis secondary to her chemotherapy.  She has had little appetite; and feels dehydrated.  She has been receiving daily IV fluid rehydration while at the cancer center.

## 2014-09-18 NOTE — Assessment & Plan Note (Signed)
Chemotherapy-induced mucositis continues; but patient states is stable at present.  She continues to use Magic mouthwash as previously directed.

## 2014-09-18 NOTE — Assessment & Plan Note (Signed)
Patient has completed 3 cycles of induction chemotherapy with docetaxel/cisplatin/5-FU chemotherapy.  Last chemotherapy received was the summer 31st 2015.  She is currently undergoing radiation therapy; and will complete all radiation therapy on 09/24/2014.  She is scheduled to return for labs and a follow-up visit here at the [facility] on 09/19/2014.

## 2014-09-19 ENCOUNTER — Encounter: Payer: Self-pay | Admitting: Hematology and Oncology

## 2014-09-19 ENCOUNTER — Ambulatory Visit (HOSPITAL_BASED_OUTPATIENT_CLINIC_OR_DEPARTMENT_OTHER): Payer: BLUE CROSS/BLUE SHIELD

## 2014-09-19 ENCOUNTER — Ambulatory Visit
Admission: RE | Admit: 2014-09-19 | Discharge: 2014-09-19 | Disposition: A | Discharge status: Home / Self Care | Payer: BLUE CROSS/BLUE SHIELD | Source: Ambulatory Visit | Attending: Radiation Oncology | Admitting: Radiation Oncology

## 2014-09-19 ENCOUNTER — Other Ambulatory Visit: Payer: BLUE CROSS/BLUE SHIELD

## 2014-09-19 ENCOUNTER — Ambulatory Visit: Payer: BLUE CROSS/BLUE SHIELD | Admitting: Nutrition

## 2014-09-19 ENCOUNTER — Ambulatory Visit (HOSPITAL_BASED_OUTPATIENT_CLINIC_OR_DEPARTMENT_OTHER): Payer: BLUE CROSS/BLUE SHIELD | Admitting: Hematology and Oncology

## 2014-09-19 VITALS — BP 110/59 | HR 85 | Temp 97.8°F | Resp 18 | Ht 67.0 in | Wt 189.5 lb

## 2014-09-19 DIAGNOSIS — Z9889 Other specified postprocedural states: Secondary | ICD-10-CM

## 2014-09-19 DIAGNOSIS — Z95828 Presence of other vascular implants and grafts: Secondary | ICD-10-CM

## 2014-09-19 DIAGNOSIS — K1231 Oral mucositis (ulcerative) due to antineoplastic therapy: Secondary | ICD-10-CM

## 2014-09-19 DIAGNOSIS — E860 Dehydration: Secondary | ICD-10-CM | POA: Diagnosis not present

## 2014-09-19 DIAGNOSIS — C01 Malignant neoplasm of base of tongue: Secondary | ICD-10-CM

## 2014-09-19 DIAGNOSIS — K5909 Other constipation: Secondary | ICD-10-CM

## 2014-09-19 DIAGNOSIS — Z510 Encounter for antineoplastic radiation therapy: Secondary | ICD-10-CM | POA: Diagnosis not present

## 2014-09-19 DIAGNOSIS — L589 Radiodermatitis, unspecified: Secondary | ICD-10-CM

## 2014-09-19 DIAGNOSIS — Z452 Encounter for adjustment and management of vascular access device: Secondary | ICD-10-CM

## 2014-09-19 MED ORDER — HEPARIN SOD (PORK) LOCK FLUSH 100 UNIT/ML IV SOLN
500.0000 [IU] | Freq: Once | INTRAVENOUS | Status: AC
  Administered 2014-09-19: 250 [IU] via INTRAVENOUS
  Filled 2014-09-19: qty 5

## 2014-09-19 MED ORDER — SODIUM CHLORIDE 0.9 % IJ SOLN
10.0000 mL | INTRAMUSCULAR | Status: DC | PRN
  Administered 2014-09-19: 10 mL via INTRAVENOUS
  Filled 2014-09-19: qty 10

## 2014-09-19 MED ORDER — SODIUM CHLORIDE 0.9 % IV SOLN
1000.0000 mL | Freq: Once | INTRAVENOUS | Status: AC
  Administered 2014-09-19: 1000 mL via INTRAVENOUS

## 2014-09-19 NOTE — Assessment & Plan Note (Signed)
Patient has been suffering with some chronic mucositis secondary to her chemotherapy.  She has had little appetite; and feels dehydrated.  She has been receiving daily IV fluid rehydration while at the cancer center.

## 2014-09-19 NOTE — Progress Notes (Signed)
Nutrition follow-up completed with patient during IV fluids. Patient completes radiation therapy for tongue cancer on April 4. Weight continues to decrease and was documented as 189.5 pounds March 30, down from 192.8 pounds March 23. Patient continues to report severe constipation.  Last bowel movement was on Friday. Patient reports taking Senokot, and MiraLAX as directed. Patient complains of mucositis and increased thick saliva. Nutrition impact symptoms are limiting oral intake.  Nutrition diagnosis: Inadequate oral intake continues.  Intervention: Patient encouraged to continue strategies for improving constipation. Stressed importance of patient continuing to focus on adequate calories and protein to promote healing. Reviewed strategies for improving mucositis. Teach back method used.  Monitoring, evaluation, goals: Patient will work to increase calories and protein to minimize weight loss.  Next visit: I will continue to work with patient to improve nutrition intake.  **Disclaimer: This note was dictated with voice recognition software. Similar sounding words can inadvertently be transcribed and this note may contain transcription errors which may not have been corrected upon publication of note.**

## 2014-09-19 NOTE — Assessment & Plan Note (Signed)
She has mild worsening radiation-induced skin dermatitis. I recommend topical emollient cream. We will monitored carefully.

## 2014-09-19 NOTE — Assessment & Plan Note (Signed)
She had worsening mucositis. I recommend she takes pain medicine as needed. And I plan to switch her pain medicine to Roxanol for easier absorption. Recommend IV fluids and she agreed to proceed. I recommend she continues Magic mouthwash prescription.

## 2014-09-19 NOTE — Assessment & Plan Note (Signed)
She will continue to have problems with constipation. I recommend she increase the Senokot to 2 tablets 3 times a day and add Mira lax daily.

## 2014-09-19 NOTE — Patient Instructions (Signed)

## 2014-09-19 NOTE — Assessment & Plan Note (Signed)
She will continue PICC line flushes at home and PICC line dressing change every week. Clinically, it appears to be working well.

## 2014-09-19 NOTE — Progress Notes (Signed)
[facility] OFFICE PROGRESS NOTE  Patient Care Team: Gaynelle Arabian, MD as PCP - General (Family Medicine) Leota Sauers, RN as Oncology Nurse [place], RD as Dietitian (Nutrition) Eppie Gibson, MD as Attending Physician (Radiation Oncology)  SUMMARY OF ONCOLOGIC HISTORY: Oncology History   Cancer of base of tongue   Staging form: Lip and Oral Cavity, AJCC 7th Edition     Clinical: Stage IVA (T3, N2b, M0) - Signed by Heath Lark, MD on 05/09/2014       Cancer of base of tongue   04/20/2014 Imaging CT neck showed advanced base of tongue mass and multiple right neck lymphadenopathy   04/23/2014 Pathology Results [id number] FNA of right neck is positive for squamous cell cancer, P16 positive   05/09/2014 Imaging PET/CT scan confirmed base of tongue cancer along with lymph node metastasis.   05/10/2014 Procedure She has placement of PICC line.   05/11/2014 - 06/26/2014 Chemotherapy She received 3 cycles of induction chemotherapy with Taxotere, cisplatin and 5-FU.   05/19/2014 - 05/22/2014 Hospital Admission She was admitted to the hospital for management of neutropenic fever, GI bleed requiring blood transfusion and diarrhea from chemotherapy   07/19/2014 Imaging Repeat PET/CT scan showed complete response to treatment   08/07/2014 -  Radiation Therapy She received radiation treatment.    INTERVAL HISTORY: Please see below for problem oriented charting. She is seen today as part of her weekly supportive care visit. She had severe constipation. She still rating her pain at 8 out of 10 but that was because she stop her pain medication because of severe constipation. She also has significant secretion from her mouth, improved after she was placed on scopolamine patch. Denies recent nausea or vomiting.  REVIEW OF SYSTEMS:   Constitutional: Denies fevers, chills or abnormal weight loss Eyes: Denies blurriness of vision Respiratory: Denies cough, dyspnea or  wheezes Cardiovascular: Denies palpitation, chest discomfort or lower extremity swelling Lymphatics: Denies new lymphadenopathy or easy bruising Neurological:Denies numbness, tingling or new weaknesses Behavioral/Psych: Mood is stable, no new changes  All other systems were reviewed with the patient and are negative.  I have reviewed the past medical history, past surgical history, social history and family history with the patient and they are unchanged from previous note.  ALLERGIES:  is allergic to penicillins.  MEDICATIONS:  Current Outpatient Prescriptions  Medication Sig Dispense Refill  . ALPRAZolam (XANAX) 0.25 MG tablet Take 0.25 mg by mouth as needed for anxiety.    . Alum & Mag Hydroxide-Simeth (MAGIC MOUTHWASH) SOLN Take 5 mLs by mouth 4 (four) times daily as needed for mouth pain. 500 mL 6  . amLODipine (NORVASC) 10 MG tablet Take 10 mg by mouth daily.    [place] Kitchen aspirin 81 MG tablet Take 81 mg by mouth daily.    Mar[place] Kitchen emollient (BIAFINE) cream Apply topically 2 (two) times daily.    . fluconazole (DIFLUCAN) 100 MG tablet     . HYDROcodone-acetaminophen (NORCO/VICODIN) 5-325 MG per tablet Take 1 tablet by mouth every 4 (four) hours as needed for moderate pain. 60 tablet 0  . metFORMIN (GLUCOPHAGE) 500 MG tablet Take 500 mg by mouth daily with breakfast.    . morphine (ROXANOL) 20 MG/ML concentrated solution Take 0.5 mLs (10 mg total) by mouth every 2 (two) hours as needed for severe pain. 240 mL 0  . nystatin (MYCOSTATIN) powder Apply topically 4 (four) times daily. 30 g 0  . ondansetron (ZOFRAN) 8 MG tablet Take 1 tablet (8  mg total) by mouth every 8 (eight) hours as needed (Nausea or vomiting). 90 tablet 1  . Prenatal Vit-Fe Fumarate-FA (MULTIVITAMIN-PRENATAL) 27-0.8 MG TABS tablet Take 1 tablet by mouth daily at 12 noon.    . prochlorperazine (COMPAZINE) 10 MG tablet     . scopolamine (TRANSDERM-SCOP) 1 MG/3DAYS Place 1 patch (1.5 mg total) onto the skin every 3 (three) days. 10  patch 1  . sennosides-docusate sodium (SENOKOT-S) 8.6-50 MG tablet Take 2 tablets by mouth 2 (two) times daily.    . Sodium Chloride Flush 0.9 % SOLN injection Inject 10 mLs into the vein daily. 60 Syringe 3  . sodium fluoride (FLUORISHIELD) 1.1 % GEL dental gel Instill one drop of gel per tooth space of fluoride tray. Place over teeth for 5 minutes. Remove. Spit out excess. Repeat nightly. 120 mL prn  . atorvastatin (LIPITOR) 10 MG tablet Take 10 mg by mouth daily.     No current facility-administered medications for this visit.    PHYSICAL EXAMINATION: ECOG PERFORMANCE STATUS: 1 - Symptomatic but completely ambulatory  Filed Vitals:   09/19/14 0854  BP: 110/59  Pulse: 85  Temp: 97.8 F (36.6 C)  Resp: 18   Filed Weights   09/19/14 0854  Weight: 189 lb 8 oz (85.957 kg)    GENERAL:alert, no distress and comfortable SKIN: skin color, texture, turgor are normal, no rashes or significant lesions. Noted radiation-induced dermatitis around the neck but no ulceration EYES: normal, Conjunctiva are pink and non-injected, sclera clear OROPHARYNX:no exudate, no erythema and lips, buccal mucosa, and tongue normal . No thrush is noted NECK: supple, thyroid normal size, non-tender, without nodularity LYMPH:  no palpable lymphadenopathy in the cervical, axillary or inguinal LUNGS: clear to auscultation and percussion with normal breathing effort HEART: regular rate & rhythm and no murmurs and no lower extremity edema ABDOMEN:abdomen soft, non-tender and normal bowel sounds Musculoskeletal:no cyanosis of digits and no clubbing  NEURO: alert & oriented x 3 with fluent speech, no focal motor/sensory deficits  LABORATORY DATA:  I have reviewed the data as listed    Component Value Date/Time   NA 138 09/06/2014 0910   NA 138 05/22/2014 0445   K 4.1 09/06/2014 0910   K 3.0* 05/22/2014 0445   CL 101 05/22/2014 0445   CO2 27 09/06/2014 0910   CO2 26 05/22/2014 0445   GLUCOSE 124 09/06/2014  0910   GLUCOSE 144* 05/22/2014 0445   BUN 9.3 09/06/2014 0910   BUN 5* 05/22/2014 0445   CREATININE 0.7 09/06/2014 0910   CREATININE 0.66 05/22/2014 0445   CALCIUM 9.6 09/06/2014 0910   CALCIUM 8.1* 05/22/2014 0445   PROT 7.6 09/06/2014 0910   PROT 5.9* 05/20/2014 0600   ALBUMIN 3.8 09/06/2014 0910   ALBUMIN 2.5* 05/20/2014 0600   AST 17 09/06/2014 0910   AST 10 05/20/2014 0600   ALT 20 09/06/2014 0910   ALT 17 05/20/2014 0600   ALKPHOS 83 09/06/2014 0910   ALKPHOS 67 05/20/2014 0600   BILITOT 0.34 09/06/2014 0910   BILITOT 0.3 05/20/2014 0600   GFRNONAA >90 05/22/2014 0445   GFRAA >90 05/22/2014 0445    No results found for: SPEP, UPEP  Lab Results  Component Value Date   WBC 4.8 09/06/2014   NEUTROABS 3.8 09/06/2014   HGB 12.0 09/06/2014   HCT 36.2 09/06/2014   MCV 93.8 09/06/2014   PLT 188 09/06/2014      Chemistry      Component Value Date/Time   NA 138  09/06/2014 0910   NA 138 05/22/2014 0445   K 4.1 09/06/2014 0910   K 3.0* 05/22/2014 0445   CL 101 05/22/2014 0445   CO2 27 09/06/2014 0910   CO2 26 05/22/2014 0445   BUN 9.3 09/06/2014 0910   BUN 5* 05/22/2014 0445   CREATININE 0.7 09/06/2014 0910   CREATININE 0.66 05/22/2014 0445      Component Value Date/Time   CALCIUM 9.6 09/06/2014 0910   CALCIUM 8.1* 05/22/2014 0445   ALKPHOS 83 09/06/2014 0910   ALKPHOS 67 05/20/2014 0600   AST 17 09/06/2014 0910   AST 10 05/20/2014 0600   ALT 20 09/06/2014 0910   ALT 17 05/20/2014 0600   BILITOT 0.34 09/06/2014 0910   BILITOT 0.3 05/20/2014 0600      ASSESSMENT & PLAN:  Cancer of base of tongue She is tolerating treatment well but with worsening side effects associated with dehydration. I recommend IV fluids this week and IV fluids 3 times weekly, 1 L of fluid each time Continue supportive care.    Dehydration Patient has been suffering with some chronic mucositis secondary to her chemotherapy.  She has had little appetite; and feels dehydrated.   She has been receiving daily IV fluid rehydration while at the cancer center.   Mucositis due to chemotherapy She had worsening mucositis. I recommend she takes pain medicine as needed. And I plan to switch her pain medicine to Roxanol for easier absorption. Recommend IV fluids and she agreed to proceed. I recommend she continues Magic mouthwash prescription.    Other constipation She will continue to have problems with constipation. I recommend she increase the Senokot to 2 tablets 3 times a day and add Mira lax daily.   Radiation-induced dermatitis She has mild worsening radiation-induced skin dermatitis. I recommend topical emollient cream. We will monitored carefully.   S/P PICC central line placement She will continue PICC line flushes at home and PICC line dressing change every week. Clinically, it appears to be working well.      No orders of the defined types were placed in this encounter.   All questions were answered. The patient knows to call the clinic with any problems, questions or concerns. No barriers to learning was detected. I spent 25 minutes counseling the patient face to face. The total time spent in the appointment was 30 minutes and more than 50% was on counseling and review of test results     [facility], [place], MD 09/19/2014 9:26 AM  ,

## 2014-09-19 NOTE — Patient Instructions (Signed)
PICC Home Guide A peripherally inserted central catheter (PICC) is a long, thin, flexible tube that is inserted into a vein in the upper arm. It is a form of intravenous (IV) access. It is considered to be a "central" line because the tip of the PICC ends in a large vein in your chest. This large vein is called the superior vena cava (SVC). The PICC tip ends in the SVC because there is a lot of blood flow in the SVC. This allows medicines and IV fluids to be quickly distributed throughout the body. The PICC is inserted using a sterile technique by a specially trained nurse or physician. After the PICC is inserted, a chest X-ray exam is done to be sure it is in the correct place.  A PICC may be placed for different reasons, such as:  To give medicines and liquid nutrition that can only be given through a central line. Examples are:  Certain antibiotic treatments.  Chemotherapy.  Total parenteral nutrition (TPN).  To take frequent blood samples.  To give IV fluids and blood products.  If there is difficulty placing a peripheral intravenous (PIV) catheter. If taken care of properly, a PICC can remain in place for several months. A PICC can also allow a person to go home from the hospital early. Medicine and PICC care can be managed at home by a family member or home health care team. WHAT PROBLEMS CAN HAPPEN WHEN I HAVE A PICC? Problems with a PICC can occasionally occur. These may include the following:  A blood clot (thrombus) forming in or at the tip of the PICC. This can cause the PICC to become clogged. A clot-dissolving medicine called tissue plasminogen activator (tPA) can be given through the PICC to help break up the clot.  Inflammation of the vein (phlebitis) in which the PICC is placed. Signs of inflammation may include redness, pain at the insertion site, red streaks, or being able to feel a "cord" in the vein where the PICC is located.  Infection in the PICC or at the insertion  site. Signs of infection may include fever, chills, redness, swelling, or pus drainage from the PICC insertion site.  PICC movement (malposition). The PICC tip may move from its original position due to excessive physical activity, forceful coughing, sneezing, or vomiting.  A break or cut in the PICC. It is important to not use scissors near the PICC.  Nerve or tendon irritation or injury during PICC insertion. WHAT SHOULD I KEEP IN MIND ABOUT ACTIVITIES WHEN I HAVE A PICC?  You may bend your arm and move it freely. If your PICC is near or at the bend of your elbow, avoid activity with repeated motion at the elbow.  Rest at home for the remainder of the day following PICC line insertion.  Avoid lifting heavy objects as instructed by your health care provider.  Avoid using a crutch with the arm on the same side as your PICC. You may need to use a walker. WHAT SHOULD I KNOW ABOUT MY PICC DRESSING?  Keep your PICC bandage (dressing) clean and dry to prevent infection.  Ask your health care provider when you may shower. Ask your health care provider to teach you how to wrap the PICC when you do take a shower.  Change the PICC dressing as instructed by your health care provider.  Change your PICC dressing if it becomes loose or wet. WHAT SHOULD I KNOW ABOUT PICC CARE?  Check the PICC insertion site   daily for leakage, redness, swelling, or pain.  Do not take a bath, swim, or use hot tubs when you have a PICC. Cover PICC line with clear plastic wrap and tape to keep it dry while showering.  Flush the PICC as directed by your health care provider. Let your health care provider know right away if the PICC is difficult to flush or does not flush. Do not use force to flush the PICC.  Do not use a syringe that is less than 10 mL to flush the PICC.  Never pull or tug on the PICC.  Avoid blood pressure checks on the arm with the PICC.  Keep your PICC identification card with you at all  times.  Do not take the PICC out yourself. Only a trained clinical professional should remove the PICC. SEEK IMMEDIATE MEDICAL CARE IF:  Your PICC is accidentally pulled all the way out. If this happens, cover the insertion site with a bandage or gauze dressing. Do not throw the PICC away. Your health care provider will need to inspect it.  Your PICC was tugged or pulled and has partially come out. Do not  push the PICC back in.  There is any type of drainage, redness, or swelling where the PICC enters the skin.  You cannot flush the PICC, it is difficult to flush, or the PICC leaks around the insertion site when it is flushed.  You hear a "flushing" sound when the PICC is flushed.  You have pain, discomfort, or numbness in your arm, shoulder, or jaw on the same side as the PICC.  You feel your heart "racing" or skipping beats.  You notice a hole or tear in the PICC.  You develop chills or a fever. MAKE SURE YOU:   Understand these instructions.  Will watch your condition.  Will get help right away if you are not doing well or get worse. Document Released: 12/13/2002 Document Revised: 10/23/2013 Document Reviewed: 02/13/2013 Exit[employer] Patient Information 2015 [employer]. This information is not intended to replace advice given to you by your health care provider. Make sure you discuss any questions you have with your health care provider.  

## 2014-09-19 NOTE — Assessment & Plan Note (Signed)
She is tolerating treatment well but with worsening side effects associated with dehydration. I recommend IV fluids this week and IV fluids 3 times weekly, 1 L of fluid each time Continue supportive care.

## 2014-09-20 ENCOUNTER — Ambulatory Visit
Admission: RE | Admit: 2014-09-20 | Discharge: 2014-09-20 | Disposition: A | Discharge status: Home / Self Care | Payer: BLUE CROSS/BLUE SHIELD | Source: Ambulatory Visit | Attending: Radiation Oncology | Admitting: Radiation Oncology

## 2014-09-20 DIAGNOSIS — Z510 Encounter for antineoplastic radiation therapy: Secondary | ICD-10-CM | POA: Diagnosis not present

## 2014-09-21 ENCOUNTER — Ambulatory Visit (HOSPITAL_BASED_OUTPATIENT_CLINIC_OR_DEPARTMENT_OTHER): Payer: BLUE CROSS/BLUE SHIELD

## 2014-09-21 ENCOUNTER — Ambulatory Visit
Admission: RE | Admit: 2014-09-21 | Discharge: 2014-09-21 | Disposition: A | Discharge status: Home / Self Care | Payer: BLUE CROSS/BLUE SHIELD | Source: Ambulatory Visit | Attending: Radiation Oncology | Admitting: Radiation Oncology

## 2014-09-21 DIAGNOSIS — E860 Dehydration: Secondary | ICD-10-CM | POA: Diagnosis not present

## 2014-09-21 DIAGNOSIS — C01 Malignant neoplasm of base of tongue: Secondary | ICD-10-CM | POA: Diagnosis not present

## 2014-09-21 DIAGNOSIS — K1231 Oral mucositis (ulcerative) due to antineoplastic therapy: Secondary | ICD-10-CM

## 2014-09-21 DIAGNOSIS — Z510 Encounter for antineoplastic radiation therapy: Secondary | ICD-10-CM | POA: Diagnosis not present

## 2014-09-21 MED ORDER — HEPARIN SOD (PORK) LOCK FLUSH 100 UNIT/ML IV SOLN
500.0000 [IU] | Freq: Once | INTRAVENOUS | Status: AC | PRN
  Administered 2014-09-21: 500 [IU]
  Filled 2014-09-21: qty 5

## 2014-09-21 MED ORDER — SODIUM CHLORIDE 0.9 % IJ SOLN
10.0000 mL | INTRAMUSCULAR | Status: DC | PRN
  Administered 2014-09-21: 10 mL
  Filled 2014-09-21: qty 10

## 2014-09-21 MED ORDER — SODIUM CHLORIDE 0.9 % IV SOLN
1000.0000 mL | Freq: Once | INTRAVENOUS | Status: AC
  Administered 2014-09-21: 09:00:00 via INTRAVENOUS

## 2014-09-21 NOTE — Patient Instructions (Signed)

## 2014-09-21 NOTE — Progress Notes (Signed)
Patient ambulates well, with no signs of dizziness or distress at discharge; post vital signs stable.

## 2014-09-24 ENCOUNTER — Ambulatory Visit: Payer: Self-pay

## 2014-09-24 ENCOUNTER — Ambulatory Visit: Payer: BLUE CROSS/BLUE SHIELD | Attending: Radiation Oncology

## 2014-09-24 ENCOUNTER — Ambulatory Visit
Admission: RE | Admit: 2014-09-24 | Discharge: 2014-09-24 | Disposition: A | Discharge status: Home / Self Care | Payer: BLUE CROSS/BLUE SHIELD | Source: Ambulatory Visit | Attending: Radiation Oncology | Admitting: Radiation Oncology

## 2014-09-24 ENCOUNTER — Encounter: Payer: Self-pay | Admitting: *Deleted

## 2014-09-24 ENCOUNTER — Encounter: Payer: Self-pay | Admitting: Radiation Oncology

## 2014-09-24 ENCOUNTER — Ambulatory Visit (HOSPITAL_BASED_OUTPATIENT_CLINIC_OR_DEPARTMENT_OTHER): Payer: BLUE CROSS/BLUE SHIELD

## 2014-09-24 VITALS — BP 113/74 | HR 121 | Temp 97.7°F | Resp 20 | Wt 181.5 lb

## 2014-09-24 DIAGNOSIS — C01 Malignant neoplasm of base of tongue: Secondary | ICD-10-CM

## 2014-09-24 DIAGNOSIS — Z510 Encounter for antineoplastic radiation therapy: Secondary | ICD-10-CM | POA: Diagnosis not present

## 2014-09-24 DIAGNOSIS — K1231 Oral mucositis (ulcerative) due to antineoplastic therapy: Secondary | ICD-10-CM

## 2014-09-24 DIAGNOSIS — E860 Dehydration: Secondary | ICD-10-CM

## 2014-09-24 DIAGNOSIS — M436 Torticollis: Secondary | ICD-10-CM | POA: Insufficient documentation

## 2014-09-24 DIAGNOSIS — Z5189 Encounter for other specified aftercare: Secondary | ICD-10-CM | POA: Insufficient documentation

## 2014-09-24 MED ORDER — SODIUM CHLORIDE 0.9 % IV SOLN
Freq: Once | INTRAVENOUS | Status: AC
  Administered 2014-09-24: 09:00:00 via INTRAVENOUS

## 2014-09-24 MED ORDER — SODIUM CHLORIDE 0.9 % IJ SOLN
10.0000 mL | INTRAMUSCULAR | Status: DC | PRN
  Filled 2014-09-24: qty 10

## 2014-09-24 NOTE — Patient Instructions (Signed)
Dehydration, Adult Dehydration is when you lose more fluids from the body than you take in. Vital organs like the kidneys, brain, and heart cannot function without a proper amount of fluids and salt. Any loss of fluids from the body can cause dehydration.  CAUSES   Vomiting.  Diarrhea.  Excessive sweating.  Excessive urine output.  Fever. SYMPTOMS  Mild dehydration  Thirst.  Dry lips.  Slightly dry mouth. Moderate dehydration  Very dry mouth.  Sunken eyes.  Skin does not bounce back quickly when lightly pinched and released.  Dark urine and decreased urine production.  Decreased tear production.  Headache. Severe dehydration  Very dry mouth.  Extreme thirst.  Rapid, weak pulse (more than 100 beats per minute at rest).  Cold hands and feet.  Not able to sweat in spite of heat and temperature.  Rapid breathing.  Blue lips.  Confusion and lethargy.  Difficulty being awakened.  Minimal urine production.  No tears. DIAGNOSIS  Your caregiver will diagnose dehydration based on your symptoms and your exam. Blood and urine tests will help confirm the diagnosis. The diagnostic evaluation should also identify the cause of dehydration. TREATMENT  Treatment of mild or moderate dehydration can often be done at home by increasing the amount of fluids that you drink. It is best to drink small amounts of fluid more often. Drinking too much at one time can make vomiting worse. Refer to the home care instructions below. Severe dehydration needs to be treated at the hospital where you will probably be given intravenous (IV) fluids that contain water and electrolytes. HOME CARE INSTRUCTIONS   Ask your caregiver about specific rehydration instructions.  Drink enough fluids to keep your urine clear or pale yellow.  Drink small amounts frequently if you have nausea and vomiting.  Eat as you normally do.  Avoid:  Foods or drinks high in sugar.  Carbonated  drinks.  Juice.  Extremely hot or cold fluids.  Drinks with caffeine.  Fatty, greasy foods.  Alcohol.  Tobacco.  Overeating.  Gelatin desserts.  Wash your hands well to avoid spreading bacteria and viruses.  Only take over-the-counter or prescription medicines for pain, discomfort, or fever as directed by your caregiver.  Ask your caregiver if you should continue all prescribed and over-the-counter medicines.  Keep all follow-up appointments with your caregiver. SEEK MEDICAL CARE IF:  You have abdominal pain and it increases or stays in one area (localizes).  You have a rash, stiff neck, or severe headache.  You are irritable, sleepy, or difficult to awaken.  You are weak, dizzy, or extremely thirsty. SEEK IMMEDIATE MEDICAL CARE IF:   You are unable to keep fluids down or you get worse despite treatment.  You have frequent episodes of vomiting or diarrhea.  You have blood or green matter (bile) in your vomit.  You have blood in your stool or your stool looks black and tarry.  You have not urinated in 6 to 8 hours, or you have only urinated a small amount of very dark urine.  You have a fever.  You faint. MAKE SURE YOU:   Understand these instructions.  Will watch your condition.  Will get help right away if you are not doing well or get worse. Document Released: 06/08/2005 Document Revised: 08/31/2011 Document Reviewed: 01/26/2011 ExitCare Patient Information 2015 [employer]. This information is not intended to replace advice given to you by your health care provider. Make sure you discuss any questions you have with your health care   provider.  

## 2014-09-24 NOTE — Progress Notes (Signed)
Weekly rad tx b/l neck ,dry desquamation, more on right side,35/35 completed, difficulty swallowing foods/protein shakes, coughs up mucous, pain 8/10 in throat ,takes morphine drops every 2 hours, helps a little stated, using MMW and has scopolamine patch behind her left ear, changes q 3 days, says it isn't helping, has nausea, takes zofran or compazine prn,  Loss 7 lbs 1 week, took ortho vitals Sitting b/p=129/75, P=112,RR=20,T=97.7 Standing b/p=113/74,P=121,  Drinks water and gatorade, ate peaches and jello , has scheduled IVF's today as well, very fatigued,  8:42 AM

## 2014-09-24 NOTE — Progress Notes (Signed)
Weekly Management Note:  Outpatient    ICD-9-CM ICD-10-CM   1. Cancer of base of tongue 141.0 C01     Current Dose: 70 Gy  Projected Dose: 70 Gy   Narrative:  The patient presents for routine under treatment assessment.  CBCT/MVCT images/Port film x-rays were reviewed.  The chart was checked.  Weekly rad tx b/l neck ,dry desquamation, more on right side,35/35 completed, difficulty swallowing foods/protein shakes, coughs up mucous, pain 8/10 in throat ,takes morphine drops 2-3x daily, helps a little stated, using MMW and has scopolamine patch behind her left ear, changes q 3 days, says it isn't helping, has nausea, takes zofran or compazine prn,  Loss 7 lbs 1 week, took ortho vitals Sitting b/p=129/75, P=112,RR=20,T=97.7 Standing b/p=113/74,P=121,  Drinks water and gatorade, ate peaches and jello , has scheduled IVF's today as well, very fatigued     BP 113/74 mmHg  Pulse 121  Temp(Src) 97.7 F (36.5 C) (Oral)  Resp 20  Wt 181 lb 8 oz (82.328 kg)  SpO2 100%  Physical Findings:  Wt Readings from Last 3 Encounters:  09/19/14 189 lb 8 oz (85.957 kg)  09/12/14 192 lb 12.8 oz (87.454 kg)  09/05/14 194 lb 12.8 oz (88.361 kg)    weight is 181 lb 8 oz (82.328 kg). Her oral temperature is 97.7 F (36.5 C). Her blood pressure is 113/74 and her pulse is 121. Her respiration is 20 and oxygen saturation is 100%.   mucosa is erythematous in throat.  No Thrush.  Skin dry and erythematous/peeling/pigmented  CBC    Component Value Date/Time   WBC 4.8 09/06/2014 0910   WBC 14.2* 05/22/2014 0445   RBC 3.86 09/06/2014 0910   RBC 2.58* 05/22/2014 0445   HGB 12.0 09/06/2014 0910   HGB 7.7* 05/22/2014 0445   HCT 36.2 09/06/2014 0910   HCT 22.6* 05/22/2014 0445   PLT 188 09/06/2014 0910   PLT 111* 05/22/2014 0445   MCV 93.8 09/06/2014 0910   MCV 87.6 05/22/2014 0445   MCH 31.0 09/06/2014 0910   MCH 29.8 05/22/2014 0445   MCHC 33.1 09/06/2014 0910   MCHC 34.1 05/22/2014 0445   RDW 12.4  09/06/2014 0910   RDW 13.8 05/22/2014 0445   LYMPHSABS 0.4* 09/06/2014 0910   LYMPHSABS 3.3 05/22/2014 0445   MONOABS 0.4 09/06/2014 0910   MONOABS 2.1* 05/22/2014 0445   EOSABS 0.2 09/06/2014 0910   EOSABS 0.1 05/22/2014 0445   BASOSABS 0.0 09/06/2014 0910   BASOSABS 0.0 05/22/2014 0445     CMP     Component Value Date/Time   NA 138 09/06/2014 0910   NA 138 05/22/2014 0445   K 4.1 09/06/2014 0910   K 3.0* 05/22/2014 0445   CL 101 05/22/2014 0445   CO2 27 09/06/2014 0910   CO2 26 05/22/2014 0445   GLUCOSE 124 09/06/2014 0910   GLUCOSE 144* 05/22/2014 0445   BUN 9.3 09/06/2014 0910   BUN 5* 05/22/2014 0445   CREATININE 0.7 09/06/2014 0910   CREATININE 0.66 05/22/2014 0445   CALCIUM 9.6 09/06/2014 0910   CALCIUM 8.1* 05/22/2014 0445   PROT 7.6 09/06/2014 0910   PROT 5.9* 05/20/2014 0600   ALBUMIN 3.8 09/06/2014 0910   ALBUMIN 2.5* 05/20/2014 0600   AST 17 09/06/2014 0910   AST 10 05/20/2014 0600   ALT 20 09/06/2014 0910   ALT 17 05/20/2014 0600   ALKPHOS 83 09/06/2014 0910   ALKPHOS 67 05/20/2014 0600   BILITOT 0.34 09/06/2014 0910  BILITOT 0.3 05/20/2014 0600   GFRNONAA >90 05/22/2014 0445   GFRAA >90 05/22/2014 0445     Impression:  The patient has completed radiotherapy.  Weight loss of 7lb in 1 week.    Plan:  F/u in 2 weeks, sooner if needed. IVF MWF in med/onc. Encouraged pt to take morphine q 2 hrs for pain.  She may stop scopolamine if not helping saliva thickness  -----------------------------------  Eppie Gibson, MD

## 2014-09-26 ENCOUNTER — Other Ambulatory Visit: Payer: Self-pay | Admitting: *Deleted

## 2014-09-26 ENCOUNTER — Encounter: Payer: Self-pay | Admitting: Hematology and Oncology

## 2014-09-26 ENCOUNTER — Ambulatory Visit (HOSPITAL_BASED_OUTPATIENT_CLINIC_OR_DEPARTMENT_OTHER): Payer: BLUE CROSS/BLUE SHIELD

## 2014-09-26 ENCOUNTER — Ambulatory Visit (HOSPITAL_BASED_OUTPATIENT_CLINIC_OR_DEPARTMENT_OTHER): Payer: BLUE CROSS/BLUE SHIELD | Admitting: Hematology and Oncology

## 2014-09-26 ENCOUNTER — Telehealth: Payer: Self-pay | Admitting: Hematology and Oncology

## 2014-09-26 DIAGNOSIS — C01 Malignant neoplasm of base of tongue: Secondary | ICD-10-CM | POA: Diagnosis not present

## 2014-09-26 DIAGNOSIS — E860 Dehydration: Secondary | ICD-10-CM

## 2014-09-26 DIAGNOSIS — Z9889 Other specified postprocedural states: Secondary | ICD-10-CM

## 2014-09-26 DIAGNOSIS — Z95828 Presence of other vascular implants and grafts: Secondary | ICD-10-CM

## 2014-09-26 DIAGNOSIS — L589 Radiodermatitis, unspecified: Secondary | ICD-10-CM | POA: Diagnosis not present

## 2014-09-26 DIAGNOSIS — K1231 Oral mucositis (ulcerative) due to antineoplastic therapy: Secondary | ICD-10-CM

## 2014-09-26 MED ORDER — SODIUM CHLORIDE 0.9 % IV SOLN
1000.0000 mL | Freq: Once | INTRAVENOUS | Status: AC
  Administered 2014-09-26: 08:00:00 via INTRAVENOUS

## 2014-09-26 MED ORDER — HEPARIN SOD (PORK) LOCK FLUSH 100 UNIT/ML IV SOLN
250.0000 [IU] | Freq: Once | INTRAVENOUS | Status: AC | PRN
  Administered 2014-09-26: 250 [IU]
  Filled 2014-09-26: qty 5

## 2014-09-26 MED ORDER — SODIUM CHLORIDE 0.9 % IJ SOLN
10.0000 mL | INTRAMUSCULAR | Status: DC | PRN
  Administered 2014-09-26: 10 mL
  Filled 2014-09-26: qty 10

## 2014-09-26 NOTE — Patient Instructions (Signed)
Dehydration, Adult Dehydration is when you lose more fluids from the body than you take in. Vital organs like the kidneys, brain, and heart cannot function without a proper amount of fluids and salt. Any loss of fluids from the body can cause dehydration.  CAUSES   Vomiting.  Diarrhea.  Excessive sweating.  Excessive urine output.  Fever. SYMPTOMS  Mild dehydration  Thirst.  Dry lips.  Slightly dry mouth. Moderate dehydration  Very dry mouth.  Sunken eyes.  Skin does not bounce back quickly when lightly pinched and released.  Dark urine and decreased urine production.  Decreased tear production.  Headache. Severe dehydration  Very dry mouth.  Extreme thirst.  Rapid, weak pulse (more than 100 beats per minute at rest).  Cold hands and feet.  Not able to sweat in spite of heat and temperature.  Rapid breathing.  Blue lips.  Confusion and lethargy.  Difficulty being awakened.  Minimal urine production.  No tears. DIAGNOSIS  Your caregiver will diagnose dehydration based on your symptoms and your exam. Blood and urine tests will help confirm the diagnosis. The diagnostic evaluation should also identify the cause of dehydration. TREATMENT  Treatment of mild or moderate dehydration can often be done at home by increasing the amount of fluids that you drink. It is best to drink small amounts of fluid more often. Drinking too much at one time can make vomiting worse. Refer to the home care instructions below. Severe dehydration needs to be treated at the hospital where you will probably be given intravenous (IV) fluids that contain water and electrolytes. HOME CARE INSTRUCTIONS   Ask your caregiver about specific rehydration instructions.  Drink enough fluids to keep your urine clear or pale yellow.  Drink small amounts frequently if you have nausea and vomiting.  Eat as you normally do.  Avoid:  Foods or drinks high in sugar.  Carbonated  drinks.  Juice.  Extremely hot or cold fluids.  Drinks with caffeine.  Fatty, greasy foods.  Alcohol.  Tobacco.  Overeating.  Gelatin desserts.  Wash your hands well to avoid spreading bacteria and viruses.  Only take over-the-counter or prescription medicines for pain, discomfort, or fever as directed by your caregiver.  Ask your caregiver if you should continue all prescribed and over-the-counter medicines.  Keep all follow-up appointments with your caregiver. SEEK MEDICAL CARE IF:  You have abdominal pain and it increases or stays in one area (localizes).  You have a rash, stiff neck, or severe headache.  You are irritable, sleepy, or difficult to awaken.  You are weak, dizzy, or extremely thirsty. SEEK IMMEDIATE MEDICAL CARE IF:   You are unable to keep fluids down or you get worse despite treatment.  You have frequent episodes of vomiting or diarrhea.  You have blood or green matter (bile) in your vomit.  You have blood in your stool or your stool looks black and tarry.  You have not urinated in 6 to 8 hours, or you have only urinated a small amount of very dark urine.  You have a fever.  You faint. MAKE SURE YOU:   Understand these instructions.  Will watch your condition.  Will get help right away if you are not doing well or get worse. Document Released: 06/08/2005 Document Revised: 08/31/2011 Document Reviewed: 01/26/2011 ExitCare Patient Information 2015 [employer]. This information is not intended to replace advice given to you by your health care provider. Make sure you discuss any questions you have with your health care   provider.  

## 2014-09-26 NOTE — Assessment & Plan Note (Signed)
Patient has been suffering with some chronic mucositis secondary to her chemotherapy.  She has had little appetite; and feels dehydrated.  She has been receiving daily IV fluid rehydration while at the cancer center.

## 2014-09-26 NOTE — Telephone Encounter (Signed)
gave and printed appt sched anda vs for pt for April and May °

## 2014-09-26 NOTE — Assessment & Plan Note (Signed)
As above, her PICC line can be discontinued once she completed IV fluid therapy.

## 2014-09-26 NOTE — Assessment & Plan Note (Signed)
She has completed her radiation treatment. Symptomatically, she is getting better. She will get her IV fluids on Friday and if she feels well enough, her PICC line can be removed. She will see Dr. Isidore Moos for supportive visit in 2 weeks and I plan to see her back a month from now.

## 2014-09-26 NOTE — Progress Notes (Signed)
[facility] OFFICE PROGRESS NOTE  Patient Care Team: Gaynelle Arabian, MD as PCP - General (Family Medicine) Leota Sauers, RN as Oncology Nurse [place], RD as Dietitian (Nutrition) Eppie Gibson, MD as Attending Physician (Radiation Oncology)  SUMMARY OF ONCOLOGIC HISTORY: Oncology History   Cancer of base of tongue   Staging form: Lip and Oral Cavity, AJCC 7th Edition     Clinical: Stage IVA (T3, N2b, M0) - Signed by Heath Lark, MD on 05/09/2014       Cancer of base of tongue   04/20/2014 Imaging CT neck showed advanced base of tongue mass and multiple right neck lymphadenopathy   04/23/2014 Pathology Results [id number] FNA of right neck is positive for squamous cell cancer, P16 positive   05/09/2014 Imaging PET/CT scan confirmed base of tongue cancer along with lymph node metastasis.   05/10/2014 Procedure She has placement of PICC line.   05/11/2014 - 06/26/2014 Chemotherapy She received 3 cycles of induction chemotherapy with Taxotere, cisplatin and 5-FU.   05/19/2014 - 05/22/2014 Hospital Admission She was admitted to the hospital for management of neutropenic fever, GI bleed requiring blood transfusion and diarrhea from chemotherapy   07/19/2014 Imaging Repeat PET/CT scan showed complete response to treatment   08/07/2014 - 09/24/2014 Radiation Therapy She received radiation treatment.    INTERVAL HISTORY: Please see below for problem oriented charting. She feels about the same, slightly improved. Her mucositis is less. She is able to swallow food. She denies nausea.  REVIEW OF SYSTEMS:   Constitutional: Denies fevers, chills or abnormal weight loss Eyes: Denies blurriness of vision Respiratory: Denies cough, dyspnea or wheezes Cardiovascular: Denies palpitation, chest discomfort or lower extremity swelling Skin: Denies abnormal skin rashes Lymphatics: Denies new lymphadenopathy or easy bruising Neurological:Denies numbness, tingling or new  weaknesses Behavioral/Psych: Mood is stable, no new changes  All other systems were reviewed with the patient and are negative.  I have reviewed the past medical history, past surgical history, social history and family history with the patient and they are unchanged from previous note.  ALLERGIES:  is allergic to penicillins.  MEDICATIONS:  Current Outpatient Prescriptions  Medication Sig Dispense Refill  . ALPRAZolam (XANAX) 0.25 MG tablet Take 0.25 mg by mouth as needed for anxiety.    . Alum & Mag Hydroxide-Simeth (MAGIC MOUTHWASH) SOLN Take 5 mLs by mouth 4 (four) times daily as needed for mouth pain. 500 mL 6  . amLODipine (NORVASC) 10 MG tablet Take 10 mg by mouth daily.    Marland Kitchen aspirin 81 MG tablet Take 81 mg by mouth daily.    Marland Kitchen atorvastatin (LIPITOR) 10 MG tablet Take 10 mg by mouth daily.    Marland Kitchen emollient (BIAFINE) cream Apply topically 2 (two) times daily.    . fluconazole (DIFLUCAN) 100 MG tablet     . HYDROcodone-acetaminophen (NORCO/VICODIN) 5-325 MG per tablet Take 1 tablet by mouth every 4 (four) hours as needed for moderate pain. 60 tablet 0  . metFORMIN (GLUCOPHAGE) 500 MG tablet Take 500 mg by mouth daily with breakfast.    . morphine (ROXANOL) 20 MG/ML concentrated solution Take 0.5 mLs (10 mg total) by mouth every 2 (two) hours as needed for severe pain. 240 mL 0  . nystatin (MYCOSTATIN) powder Apply topically 4 (four) times daily. 30 g 0  . ondansetron (ZOFRAN) 8 MG tablet Take 1 tablet (8 mg total) by mouth every 8 (eight) hours as needed (Nausea or vomiting). 90 tablet 1  . Prenatal Vit-Fe  Fumarate-FA (MULTIVITAMIN-PRENATAL) 27-0.8 MG TABS tablet Take 1 tablet by mouth daily at 12 noon.    . prochlorperazine (COMPAZINE) 10 MG tablet     . scopolamine (TRANSDERM-SCOP) 1 MG/3DAYS Place 1 patch (1.5 mg total) onto the skin every 3 (three) days. 10 patch 1  . sennosides-docusate sodium (SENOKOT-S) 8.6-50 MG tablet Take 2 tablets by mouth 2 (two) times daily.    . Sodium  Chloride Flush 0.9 % SOLN injection Inject 10 mLs into the vein daily. 60 Syringe 3  . sodium fluoride (FLUORISHIELD) 1.1 % GEL dental gel Instill one drop of gel per tooth space of fluoride tray. Place over teeth for 5 minutes. Remove. Spit out excess. Repeat nightly. 120 mL prn   No current facility-administered medications for this visit.   Facility-Administered Medications Ordered in Other Visits  Medication Dose Route Frequency Provider Last Rate Last Dose  . sodium chloride 0.9 % injection 10 mL  10 mL Intracatheter PRN Heath Lark, MD   10 mL at 09/26/14 [id number]    PHYSICAL EXAMINATION: ECOG PERFORMANCE STATUS: 1 - Symptomatic but completely ambulatory GENERAL:alert, no distress and comfortable SKIN: Noted dermatitis around the neck. No skin ulceration. EYES: normal, Conjunctiva are pink and non-injected, sclera clear OROPHARYNX:no exudate, no erythema and lips, buccal mucosa, and tongue normal  Musculoskeletal:no cyanosis of digits and no clubbing  NEURO: alert & oriented x 3 with fluent speech, no focal motor/sensory deficits  LABORATORY DATA:  I have reviewed the data as listed    Component Value Date/Time   NA 138 09/06/2014 0910   NA 138 05/22/2014 0445   K 4.1 09/06/2014 0910   K 3.0* 05/22/2014 0445   CL 101 05/22/2014 0445   CO2 27 09/06/2014 0910   CO2 26 05/22/2014 0445   GLUCOSE 124 09/06/2014 0910   GLUCOSE 144* 05/22/2014 0445   BUN 9.3 09/06/2014 0910   BUN 5* 05/22/2014 0445   CREATININE 0.7 09/06/2014 0910   CREATININE 0.66 05/22/2014 0445   CALCIUM 9.6 09/06/2014 0910   CALCIUM 8.1* 05/22/2014 0445   PROT 7.6 09/06/2014 0910   PROT 5.9* 05/20/2014 0600   ALBUMIN 3.8 09/06/2014 0910   ALBUMIN 2.5* 05/20/2014 0600   AST 17 09/06/2014 0910   AST 10 05/20/2014 0600   ALT 20 09/06/2014 0910   ALT 17 05/20/2014 0600   ALKPHOS 83 09/06/2014 0910   ALKPHOS 67 05/20/2014 0600   BILITOT 0.34 09/06/2014 0910   BILITOT 0.3 05/20/2014 0600   GFRNONAA >90  05/22/2014 0445   GFRAA >90 05/22/2014 0445    No results found for: SPEP, UPEP  Lab Results  Component Value Date   WBC 4.8 09/06/2014   NEUTROABS 3.8 09/06/2014   HGB 12.0 09/06/2014   HCT 36.2 09/06/2014   MCV 93.8 09/06/2014   PLT 188 09/06/2014      Chemistry      Component Value Date/Time   NA 138 09/06/2014 0910   NA 138 05/22/2014 0445   K 4.1 09/06/2014 0910   K 3.0* 05/22/2014 0445   CL 101 05/22/2014 0445   CO2 27 09/06/2014 0910   CO2 26 05/22/2014 0445   BUN 9.3 09/06/2014 0910   BUN 5* 05/22/2014 0445   CREATININE 0.7 09/06/2014 0910   CREATININE 0.66 05/22/2014 0445      Component Value Date/Time   CALCIUM 9.6 09/06/2014 0910   CALCIUM 8.1* 05/22/2014 0445   ALKPHOS 83 09/06/2014 0910   ALKPHOS 67 05/20/2014 0600   AST 17 09/06/2014  0910   AST 10 05/20/2014 0600   ALT 20 09/06/2014 0910   ALT 17 05/20/2014 0600   BILITOT 0.34 09/06/2014 0910   BILITOT 0.3 05/20/2014 0600      ASSESSMENT & PLAN:  Cancer of base of tongue She has completed her radiation treatment. Symptomatically, she is getting better. She will get her IV fluids on Friday and if she feels well enough, her PICC line can be removed. She will see Dr. Isidore Moos for supportive visit in 2 weeks and I plan to see her back a month from now.   Dehydration Patient has been suffering with some chronic mucositis secondary to her chemotherapy.  She has had little appetite; and feels dehydrated.  She has been receiving daily IV fluid rehydration while at the cancer center.     Radiation-induced dermatitis She has mild worsening radiation-induced skin dermatitis. I recommend topical emollient cream. We will monitored carefully.     S/P PICC central line placement As above, her PICC line can be discontinued once she completed IV fluid therapy.    No orders of the defined types were placed in this encounter.   All questions were answered. The patient knows to call the clinic with any  problems, questions or concerns. No barriers to learning was detected. I spent 15 minutes counseling the patient face to face. The total time spent in the appointment was 20 minutes and more than 50% was on counseling and review of test results     [facility], [place], MD 09/26/2014 10:33 AM

## 2014-09-26 NOTE — Assessment & Plan Note (Signed)
She has mild worsening radiation-induced skin dermatitis. I recommend topical emollient cream. We will monitored carefully.

## 2014-09-28 ENCOUNTER — Ambulatory Visit (HOSPITAL_BASED_OUTPATIENT_CLINIC_OR_DEPARTMENT_OTHER): Payer: BLUE CROSS/BLUE SHIELD

## 2014-09-28 ENCOUNTER — Other Ambulatory Visit: Payer: Self-pay | Admitting: Nurse Practitioner

## 2014-09-28 DIAGNOSIS — E860 Dehydration: Secondary | ICD-10-CM

## 2014-09-28 DIAGNOSIS — C01 Malignant neoplasm of base of tongue: Secondary | ICD-10-CM | POA: Diagnosis not present

## 2014-09-28 DIAGNOSIS — K1231 Oral mucositis (ulcerative) due to antineoplastic therapy: Secondary | ICD-10-CM

## 2014-09-28 MED ORDER — SODIUM CHLORIDE 0.9 % IV SOLN: INTRAVENOUS | Status: DC

## 2014-09-28 MED ORDER — SODIUM CHLORIDE 0.9 % IJ SOLN
10.0000 mL | INTRAMUSCULAR | Status: DC | PRN
  Administered 2014-09-28: 10 mL
  Filled 2014-09-28: qty 10

## 2014-09-28 MED ORDER — SODIUM CHLORIDE 0.9 % IJ SOLN
10.0000 mL | INTRAMUSCULAR | Status: AC | PRN
  Administered 2014-09-28: 10 mL
  Filled 2014-09-28: qty 10

## 2014-09-28 MED ORDER — HEPARIN SOD (PORK) LOCK FLUSH 100 UNIT/ML IV SOLN
250.0000 [IU] | INTRAVENOUS | Status: AC | PRN
  Administered 2014-09-28: 250 [IU]
  Filled 2014-09-28: qty 5

## 2014-09-28 MED ORDER — SODIUM CHLORIDE 0.9 % IV SOLN
Freq: Once | INTRAVENOUS | Status: AC
  Administered 2014-09-28: 08:00:00 via INTRAVENOUS

## 2014-09-28 MED ORDER — HEPARIN SOD (PORK) LOCK FLUSH 100 UNIT/ML IV SOLN
250.0000 [IU] | Freq: Once | INTRAVENOUS | Status: AC | PRN
  Administered 2014-09-28: 250 [IU]
  Filled 2014-09-28: qty 5

## 2014-09-28 NOTE — Patient Instructions (Signed)
Dehydration, Adult Dehydration is when you lose more fluids from the body than you take in. Vital organs like the kidneys, brain, and heart cannot function without a proper amount of fluids and salt. Any loss of fluids from the body can cause dehydration.  CAUSES   Vomiting.  Diarrhea.  Excessive sweating.  Excessive urine output.  Fever. SYMPTOMS  Mild dehydration  Thirst.  Dry lips.  Slightly dry mouth. Moderate dehydration  Very dry mouth.  Sunken eyes.  Skin does not bounce back quickly when lightly pinched and released.  Dark urine and decreased urine production.  Decreased tear production.  Headache. Severe dehydration  Very dry mouth.  Extreme thirst.  Rapid, weak pulse (more than 100 beats per minute at rest).  Cold hands and feet.  Not able to sweat in spite of heat and temperature.  Rapid breathing.  Blue lips.  Confusion and lethargy.  Difficulty being awakened.  Minimal urine production.  No tears. DIAGNOSIS  Your caregiver will diagnose dehydration based on your symptoms and your exam. Blood and urine tests will help confirm the diagnosis. The diagnostic evaluation should also identify the cause of dehydration. TREATMENT  Treatment of mild or moderate dehydration can often be done at home by increasing the amount of fluids that you drink. It is best to drink small amounts of fluid more often. Drinking too much at one time can make vomiting worse. Refer to the home care instructions below. Severe dehydration needs to be treated at the hospital where you will probably be given intravenous (IV) fluids that contain water and electrolytes. HOME CARE INSTRUCTIONS   Ask your caregiver about specific rehydration instructions.  Drink enough fluids to keep your urine clear or pale yellow.  Drink small amounts frequently if you have nausea and vomiting.  Eat as you normally do.  Avoid:  Foods or drinks high in sugar.  Carbonated  drinks.  Juice.  Extremely hot or cold fluids.  Drinks with caffeine.  Fatty, greasy foods.  Alcohol.  Tobacco.  Overeating.  Gelatin desserts.  Wash your hands well to avoid spreading bacteria and viruses.  Only take over-the-counter or prescription medicines for pain, discomfort, or fever as directed by your caregiver.  Ask your caregiver if you should continue all prescribed and over-the-counter medicines.  Keep all follow-up appointments with your caregiver. SEEK MEDICAL CARE IF:  You have abdominal pain and it increases or stays in one area (localizes).  You have a rash, stiff neck, or severe headache.  You are irritable, sleepy, or difficult to awaken.  You are weak, dizzy, or extremely thirsty. SEEK IMMEDIATE MEDICAL CARE IF:   You are unable to keep fluids down or you get worse despite treatment.  You have frequent episodes of vomiting or diarrhea.  You have blood or green matter (bile) in your vomit.  You have blood in your stool or your stool looks black and tarry.  You have not urinated in 6 to 8 hours, or you have only urinated a small amount of very dark urine.  You have a fever.  You faint. MAKE SURE YOU:   Understand these instructions.  Will watch your condition.  Will get help right away if you are not doing well or get worse. Document Released: 06/08/2005 Document Revised: 08/31/2011 Document Reviewed: 01/26/2011 ExitCare Patient Information 2015 [employer]. This information is not intended to replace advice given to you by your health care provider. Make sure you discuss any questions you have with your health care   provider.  

## 2014-09-28 NOTE — Progress Notes (Signed)
Patient does not think she is doing well enough with po fluids to have PICC removed.  She would like to have fluids scheduled M-W-F of next week.  Will notify Selena Lesser NP in Dr. Calton Dach absence today.

## 2014-09-29 NOTE — Progress Notes (Signed)
Met patient in Infusion where she was receiving scheduled IVF.   1. She reported that receiving IVF is helping her post-tmt recovery. 2. I answered her questions re: expectations for improved RT SEs as time progresses. 3. She understands she can contact me with needs/concerns.  [name], RN, BSN, [place] at [place] [phone]

## 2014-10-01 ENCOUNTER — Telehealth: Payer: Self-pay | Admitting: *Deleted

## 2014-10-01 ENCOUNTER — Encounter: Payer: Self-pay | Admitting: *Deleted

## 2014-10-01 ENCOUNTER — Telehealth: Payer: Self-pay | Admitting: Hematology and Oncology

## 2014-10-01 ENCOUNTER — Ambulatory Visit (HOSPITAL_BASED_OUTPATIENT_CLINIC_OR_DEPARTMENT_OTHER): Payer: BLUE CROSS/BLUE SHIELD

## 2014-10-01 DIAGNOSIS — E860 Dehydration: Secondary | ICD-10-CM | POA: Diagnosis not present

## 2014-10-01 DIAGNOSIS — C01 Malignant neoplasm of base of tongue: Secondary | ICD-10-CM | POA: Diagnosis not present

## 2014-10-01 DIAGNOSIS — K1231 Oral mucositis (ulcerative) due to antineoplastic therapy: Secondary | ICD-10-CM

## 2014-10-01 MED ORDER — HEPARIN SOD (PORK) LOCK FLUSH 100 UNIT/ML IV SOLN
250.0000 [IU] | Freq: Once | INTRAVENOUS | Status: AC | PRN
  Administered 2014-10-01: 250 [IU]
  Filled 2014-10-01: qty 5

## 2014-10-01 MED ORDER — SODIUM CHLORIDE 0.9 % IJ SOLN
10.0000 mL | INTRAMUSCULAR | Status: DC | PRN
  Administered 2014-10-01: 10 mL
  Filled 2014-10-01: qty 10

## 2014-10-01 MED ORDER — SODIUM CHLORIDE 0.9 % IV SOLN
1000.0000 mL | Freq: Once | INTRAVENOUS | Status: AC
  Administered 2014-10-01: 15:00:00 via INTRAVENOUS

## 2014-10-01 NOTE — Patient Instructions (Signed)
Dehydration, Adult Dehydration is when you lose more fluids from the body than you take in. Vital organs like the kidneys, brain, and heart cannot function without a proper amount of fluids and salt. Any loss of fluids from the body can cause dehydration.  CAUSES   Vomiting.  Diarrhea.  Excessive sweating.  Excessive urine output.  Fever. SYMPTOMS  Mild dehydration  Thirst.  Dry lips.  Slightly dry mouth. Moderate dehydration  Very dry mouth.  Sunken eyes.  Skin does not bounce back quickly when lightly pinched and released.  Dark urine and decreased urine production.  Decreased tear production.  Headache. Severe dehydration  Very dry mouth.  Extreme thirst.  Rapid, weak pulse (more than 100 beats per minute at rest).  Cold hands and feet.  Not able to sweat in spite of heat and temperature.  Rapid breathing.  Blue lips.  Confusion and lethargy.  Difficulty being awakened.  Minimal urine production.  No tears. DIAGNOSIS  Your caregiver will diagnose dehydration based on your symptoms and your exam. Blood and urine tests will help confirm the diagnosis. The diagnostic evaluation should also identify the cause of dehydration. TREATMENT  Treatment of mild or moderate dehydration can often be done at home by increasing the amount of fluids that you drink. It is best to drink small amounts of fluid more often. Drinking too much at one time can make vomiting worse. Refer to the home care instructions below. Severe dehydration needs to be treated at the hospital where you will probably be given intravenous (IV) fluids that contain water and electrolytes. HOME CARE INSTRUCTIONS   Ask your caregiver about specific rehydration instructions.  Drink enough fluids to keep your urine clear or pale yellow.  Drink small amounts frequently if you have nausea and vomiting.  Eat as you normally do.  Avoid:  Foods or drinks high in sugar.  Carbonated  drinks.  Juice.  Extremely hot or cold fluids.  Drinks with caffeine.  Fatty, greasy foods.  Alcohol.  Tobacco.  Overeating.  Gelatin desserts.  Wash your hands well to avoid spreading bacteria and viruses.  Only take over-the-counter or prescription medicines for pain, discomfort, or fever as directed by your caregiver.  Ask your caregiver if you should continue all prescribed and over-the-counter medicines.  Keep all follow-up appointments with your caregiver. SEEK MEDICAL CARE IF:  You have abdominal pain and it increases or stays in one area (localizes).  You have a rash, stiff neck, or severe headache.  You are irritable, sleepy, or difficult to awaken.  You are weak, dizzy, or extremely thirsty. SEEK IMMEDIATE MEDICAL CARE IF:   You are unable to keep fluids down or you get worse despite treatment.  You have frequent episodes of vomiting or diarrhea.  You have blood or green matter (bile) in your vomit.  You have blood in your stool or your stool looks black and tarry.  You have not urinated in 6 to 8 hours, or you have only urinated a small amount of very dark urine.  You have a fever.  You faint. MAKE SURE YOU:   Understand these instructions.  Will watch your condition.  Will get help right away if you are not doing well or get worse. Document Released: 06/08/2005 Document Revised: 08/31/2011 Document Reviewed: 01/26/2011 ExitCare Patient Information 2015 [employer]. This information is not intended to replace advice given to you by your health care provider. Make sure you discuss any questions you have with your health care   provider.  

## 2014-10-01 NOTE — Telephone Encounter (Signed)
PT. STATES SHE WAS HERE Friday AND HEARD THE NURSE GETTING PT. SCHEDULED FOR FLUIDS. THIS NOTE WAS ROUTED TO [place] AND TO CAMEO W[place],RN.

## 2014-10-01 NOTE — Telephone Encounter (Signed)
Per staff message and POF I have scheduled appts. Advised scheduler of appts. JMW  

## 2014-10-01 NOTE — Telephone Encounter (Signed)
Confirm appointment for 04/11 04/13 04/15

## 2014-10-03 ENCOUNTER — Ambulatory Visit (HOSPITAL_BASED_OUTPATIENT_CLINIC_OR_DEPARTMENT_OTHER): Payer: BLUE CROSS/BLUE SHIELD

## 2014-10-03 VITALS — BP 124/69 | HR 79 | Temp 97.8°F | Resp 19

## 2014-10-03 DIAGNOSIS — E860 Dehydration: Secondary | ICD-10-CM | POA: Diagnosis not present

## 2014-10-03 DIAGNOSIS — C01 Malignant neoplasm of base of tongue: Secondary | ICD-10-CM | POA: Diagnosis not present

## 2014-10-03 MED ORDER — SODIUM CHLORIDE 0.9 % IJ SOLN
10.0000 mL | Freq: Once | INTRAMUSCULAR | Status: AC
  Administered 2014-10-03: 10 mL via INTRAVENOUS
  Filled 2014-10-03: qty 10

## 2014-10-03 MED ORDER — HEPARIN SOD (PORK) LOCK FLUSH 100 UNIT/ML IV SOLN
500.0000 [IU] | Freq: Once | INTRAVENOUS | Status: DC
  Filled 2014-10-03: qty 5

## 2014-10-03 MED ORDER — SODIUM CHLORIDE 0.9 % IV SOLN
INTRAVENOUS | Status: DC
  Administered 2014-10-03: 14:00:00 via INTRAVENOUS

## 2014-10-03 MED ORDER — SODIUM CHLORIDE 0.9 % IJ SOLN
10.0000 mL | INTRAMUSCULAR | Status: DC | PRN
  Filled 2014-10-03: qty 10

## 2014-10-03 NOTE — Patient Instructions (Signed)

## 2014-10-04 ENCOUNTER — Telehealth: Payer: Self-pay | Admitting: *Deleted

## 2014-10-04 NOTE — Telephone Encounter (Signed)
It's up to her If she feels fine we can remove after IVF

## 2014-10-04 NOTE — Telephone Encounter (Signed)
Called patient to check on well being since completing RT last week.  In response to inquiry, patient stated:  - Pain:  Throat soreness diminished, taking Roxanol only at bedtime and on occasion before meals if she suspects the food might cause discomfort. - Nutrition/Hydration:  Eating soft foods, e.g banana, drinking well. - BMs: has not had BM since last Friday.  I encouraged her to take Miralax BID until she has BM, then daily until she returns to her daily baseline. She did not express any needs/concerns, understands she can contact me.  [name], RN, BSN, [place] at [place] [phone]

## 2014-10-04 NOTE — Telephone Encounter (Signed)
Pt asks if she should have her PICC line removed tomorrow w/ her last IVF scheduled?

## 2014-10-05 ENCOUNTER — Telehealth: Payer: Self-pay | Admitting: *Deleted

## 2014-10-05 ENCOUNTER — Ambulatory Visit (HOSPITAL_BASED_OUTPATIENT_CLINIC_OR_DEPARTMENT_OTHER): Payer: BLUE CROSS/BLUE SHIELD

## 2014-10-05 VITALS — BP 133/63 | HR 74 | Temp 98.7°F | Resp 16

## 2014-10-05 DIAGNOSIS — C01 Malignant neoplasm of base of tongue: Secondary | ICD-10-CM | POA: Diagnosis not present

## 2014-10-05 DIAGNOSIS — K1231 Oral mucositis (ulcerative) due to antineoplastic therapy: Secondary | ICD-10-CM

## 2014-10-05 DIAGNOSIS — E860 Dehydration: Secondary | ICD-10-CM | POA: Diagnosis not present

## 2014-10-05 MED ORDER — SODIUM CHLORIDE 0.9 % IV SOLN
1000.0000 mL | Freq: Once | INTRAVENOUS | Status: AC
  Administered 2014-10-05: 14:00:00 via INTRAVENOUS

## 2014-10-05 NOTE — Telephone Encounter (Signed)
Patient called from Infusion seeking guidance about her decision to have PICC removed. 1. She noted she:  Has been feeling much better these past weeks, particularly with the assist of IVF.  Is eating better.  Drinking more fluids; recognizes she needs to drink more and avoid caffeinated drinks.   Does not want to take a chance with infection with prolonged insertion (has been in place for 5 months).  Really wants to take a shower. 2. I asked if she thought PICC removal would motivate her to increase oral fluid intake and avoid dependence on PICC route.  She indicated agreement. 3. She indicated she would proceed with PICC removal, I supported her decision.  [name], RN, BSN, [place] at [place] [phone]

## 2014-10-05 NOTE — Progress Notes (Signed)
1545 R upper arm picc removed easily. 40 cm. Manual pressure for 5 minutes with vaseline gauze and 4x4 gauze. Dressing intact. Instructions given to pt. No lifting heavy objects like >10 lb. Keep dressing on for 24 hours.  May shower tomorrow about 4 pm.

## 2014-10-05 NOTE — Patient Instructions (Addendum)
Dehydration, Adult Dehydration means your body does not have as much fluid as it needs. Your kidneys, brain, and heart will not work properly without the right amount of fluids and salt.  HOME CARE  Ask your doctor how to replace body fluid losses (rehydrate).  Drink enough fluids to keep your pee (urine) clear or pale yellow.  Drink small amounts of fluids often if you feel sick to your stomach (nauseous) or throw up (vomit).  Eat like you normally do.  Avoid:  Foods or drinks high in sugar.  Bubbly (carbonated) drinks.  Juice.  Very hot or cold fluids.  Drinks with caffeine.  Fatty, greasy foods.  Alcohol.  Tobacco.  Eating too much.  Gelatin desserts.  Wash your hands to avoid spreading germs (bacteria, viruses).  Only take medicine as told by your doctor.  Keep all doctor visits as told. GET HELP RIGHT AWAY IF:   You cannot drink something without throwing up.  You get worse even with treatment.  Your vomit has blood in it or looks greenish.  Your poop (stool) has blood in it or looks black and tarry.  You have not peed in 6 to 8 hours.  You pee a small amount of very dark pee.  You have a fever.  You pass out (faint).  You have belly (abdominal) pain that gets worse or stays in one spot (localizes).  You have a rash, stiff neck, or bad headache.  You get easily annoyed, sleepy, or are hard to wake up.  You feel weak, dizzy, or very thirsty. MAKE SURE YOU:   Understand these instructions.  Will watch your condition.  Will get help right away if you are not doing well or get worse. Document Released: 04/04/2009 Document Revised: 08/31/2011 Document Reviewed: 01/26/2011 [facility] Patient Information 2015 [place], [place]. This information is not intended to replace advice given to you by your health care provider. Make sure you discuss any questions you have with your health care provider.  PICC Removal, Care After  Refer to this sheet in the  next few weeks. These instructions provide you with information on caring for yourself after your procedure. Your health care provider may also give you more specific instructions. Your treatment has been planned according to current medical practices, but problems sometimes occur. Call your health care provider if you have any problems or questions after your procedure. WHAT TO EXPECT AFTER THE PROCEDURE After your procedure, it is typical to have mild discomfort at the insertion site. This should not last for more than a day. HOME CARE INSTRUCTIONS You may remove the bandage after 24 hours. The PICC insertion site is very small. A small scab may develop over the insertion site.  It is okay to wash the site gently with soap and water. Be careful not to remove or pick off the scab. Gently pat the site dry after washing it. You do not need to put another bandage over the insertion site. Do not lift anything heavy or do strenuous physical activity for 24 hours after the PICC is removed. This includes:  Weight lifting.  Strenuous yard work.  Any physical activity with repetitive arm movement.  SEEK MEDICAL CARE IF:   You have swelling or puffiness in your arm at the PICC insertion site.  You have increasing tenderness at the PICC insertion site. SEEK IMMEDIATE MEDICAL CARE IF:   You have numbness or tingling in your fingers, hand, or arm.  Your arm looks blue and feels cold.  You have redness around the insertion site or a red streak goes up your arm.  You have any type of drainage from the PICC insertion site. This includes drainage such as:  Bleeding from the insertion site. If this happens, apply firm, direct pressure to the PICC insertion site with a clean towel.  Drainage that is yellow or tan.  You have a fever. Document Released: 06/13/2013 Document Reviewed: 06/13/2013 [facility] Patient Information 2015 [place], [place]. This information is not intended to replace advice given  to you by your health care provider. Make sure you discuss any questions you have with your health care provider.

## 2014-10-08 ENCOUNTER — Encounter: Payer: Self-pay | Admitting: Hematology and Oncology

## 2014-10-08 NOTE — Progress Notes (Signed)
Returned pt's phone call.  Left msg to return my call.

## 2014-10-08 NOTE — Progress Notes (Signed)
Spoke to pt that would like to set up payment arrangements.  I gave her the physician billing number as well as [facility] billing number.

## 2014-10-10 ENCOUNTER — Ambulatory Visit
Admission: RE | Admit: 2014-10-10 | Discharge: 2014-10-10 | Disposition: A | Discharge status: Home / Self Care | Payer: BLUE CROSS/BLUE SHIELD | Source: Ambulatory Visit | Attending: Radiation Oncology | Admitting: Radiation Oncology

## 2014-10-10 ENCOUNTER — Encounter: Payer: Self-pay | Admitting: Radiation Oncology

## 2014-10-10 VITALS — BP 138/71 | HR 89 | Temp 97.5°F | Resp 20 | Ht 67.0 in | Wt 180.3 lb

## 2014-10-10 DIAGNOSIS — R634 Abnormal weight loss: Secondary | ICD-10-CM

## 2014-10-10 DIAGNOSIS — C01 Malignant neoplasm of base of tongue: Secondary | ICD-10-CM

## 2014-10-10 NOTE — Progress Notes (Signed)
Radiation Oncology         [phone] ________________________________  Name: [name] MRN: [id number]  Date: 10/10/2014  DOB: 01/02/1956  Follow-Up Visit Note  CC: Simona Huh, MD  Jodi Marble, MD  Diagnosis and Prior Radiotherapy:       ICD-9-CM ICD-10-CM   1. Cancer of base of tongue 141.0 C01      [id number] Stage IVA right base of tongue poorly differentiated squamous cell carcinoma, p16+  Narrative:  The patient returns today for routine follow-up. She finished RT earlier this month. Doing well. Weight down 1 lb.  Main complaint - thick saliva.                       ALLERGIES:  is allergic to penicillins.  Meds: Current Outpatient Prescriptions  Medication Sig Dispense Refill  . ALPRAZolam (XANAX) 0.25 MG tablet Take 0.25 mg by mouth as needed for anxiety.    . Alum & Mag Hydroxide-Simeth (MAGIC MOUTHWASH) SOLN Take 5 mLs by mouth 4 (four) times daily as needed for mouth pain. 500 mL 6  . aspirin 81 MG tablet Take 81 mg by mouth daily.    Marland Kitchen atorvastatin (LIPITOR) 10 MG tablet Take 10 mg by mouth daily.    Marland Kitchen emollient (BIAFINE) cream Apply topically 2 (two) times daily.    . fluconazole (DIFLUCAN) 100 MG tablet     . ondansetron (ZOFRAN) 8 MG tablet Take 1 tablet (8 mg total) by mouth every 8 (eight) hours as needed (Nausea or vomiting). 90 tablet 1  . Prenatal Vit-Fe Fumarate-FA (MULTIVITAMIN-PRENATAL) 27-0.8 MG TABS tablet Take 1 tablet by mouth daily at 12 noon.    . prochlorperazine (COMPAZINE) 10 MG tablet     . scopolamine (TRANSDERM-SCOP) 1 MG/3DAYS Place 1 patch (1.5 mg total) onto the skin every 3 (three) days. 10 patch 1  . sennosides-docusate sodium (SENOKOT-S) 8.6-50 MG tablet Take 2 tablets by mouth 2 (two) times daily.    . sodium fluoride (FLUORISHIELD) 1.1 % GEL dental gel Instill one drop of gel per tooth space of fluoride tray. Place over teeth for 5 minutes. Remove. Spit out excess. Repeat nightly. 120 mL prn  . amLODipine (NORVASC) 10 MG  tablet Take 10 mg by mouth daily.    Marland Kitchen HYDROcodone-acetaminophen (NORCO/VICODIN) 5-325 MG per tablet Take 1 tablet by mouth every 4 (four) hours as needed for moderate pain. (Patient not taking: Reported on 10/10/2014) 60 tablet 0  . metFORMIN (GLUCOPHAGE) 500 MG tablet Take 500 mg by mouth daily with breakfast.    . morphine (ROXANOL) 20 MG/ML concentrated solution Take 0.5 mLs (10 mg total) by mouth every 2 (two) hours as needed for severe pain. (Patient not taking: Reported on 10/10/2014) 240 mL 0  . nystatin (MYCOSTATIN) powder Apply topically 4 (four) times daily. (Patient not taking: Reported on 10/10/2014) 30 g 0   No current facility-administered medications for this encounter.    Physical Findings: The patient is in no acute distress. Patient is alert and oriented. Wt Readings from Last 3 Encounters:  09/24/14 181 lb 8 oz (82.328 kg)  09/19/14 189 lb 8 oz (85.957 kg)  09/12/14 192 lb 12.8 oz (87.454 kg)    height is 5\' 7"  (1.702 m) and weight is 180 lb 4.8 oz (81.784 kg). Her temperature is 97.5 F (36.4 C). Her blood pressure is 138/71 and her pulse is 89. Her respiration is 20 and oxygen saturation is 98%. .  Oropharyngeal mucosa  is intact with no thrush or lesions. No palpable cervical or supraclavicular lymphadenopathy. Skin intact and smooth over neck. Dryness over back of neck    Lab Findings: Lab Results  Component Value Date   WBC 4.8 09/06/2014   HGB 12.0 09/06/2014   HCT 36.2 09/06/2014   MCV 93.8 09/06/2014   PLT 188 09/06/2014    Lab Results  Component Value Date   TSH 1.404 05/28/2014    Radiographic Findings: No results found.  Impression/Plan:    1) Head and Neck Cancer Status: healing well from RT  2) Nutritional Status: - weight: 1 pound loss - PEG tube: none  3) Risk Factors: The patient has been educated about risk factors including alcohol and tobacco abuse; they understand that avoidance of alcohol and tobacco is important to prevent  recurrences as well as other cancers  4) Swallowing: good  5) Dental: Encouraged to continue regular followup with dentistry, and dental hygiene including fluoride rinses.   6) Thyroid function:  Lab Results  Component Value Date   TSH 1.404 05/28/2014   Recheck in August  7) Social: No active social issues to address at this time   8) Other: thick saliva - try scopolamine.  Dry skin - vit E lotion.  9) Follow-up in August with PET, TSH. [name], RN, our Head and Neck Oncology Navigator will refer back to see Dr Erik Obey in June.  She sees med onc in May.  The patient was encouraged to call with any issues or questions before then.   _____________________________________   Eppie Gibson, MD

## 2014-10-10 NOTE — Progress Notes (Signed)
Mrs. Blackson here for reassessment.  Her main concern is thickened saliva which makes her gag and decreases her enjoyment of food, despite Biotene, Magic Mouthwash, and Baking Soda with Salt mixture with minimal results.  Note mild  Redness in throat with whitish areas on her tongue. Mild erythema and mild hyperpigmentation of skin on neck.  Note thickened saliva on inspection.  Eating moist soft foods.  Lost 1 lb since last Epic weight.

## 2014-10-11 ENCOUNTER — Telehealth: Payer: Self-pay | Admitting: *Deleted

## 2014-10-11 NOTE — Telephone Encounter (Signed)
Per Dr. Isidore Moos, I called [facility], spoke with Michel Bickers, requested June f/u appt for patient with Dr. Erik Obey.  She verbalized understanding, indicated she would arrange.  [name], RN, BSN, [place] at [place] [phone]

## 2014-10-11 NOTE — Telephone Encounter (Signed)
CALLED PATIENT TO INFORM OF LAB AND PET AND FU VISIT, LVM FOR A RETURN CALL

## 2014-10-16 ENCOUNTER — Telehealth: Payer: Self-pay | Admitting: *Deleted

## 2014-10-16 NOTE — Telephone Encounter (Signed)
Patient called to report: 1. Excessive coughing after eating creamy slaw this weekend.  Resulted in voice hoarseness, sore throat.  Throat soreness persists but is resolving.  She tolerated corn, stewed apples during same meal. 2. We discussed that during early weeks s/p final RT, she will find that certain foods are better tolerated than others. 3. I encouraged her to try different foods, to revisit those that cause difficulty at present time.   [name], RN, BSN, [place] at [place] [phone]

## 2014-10-24 ENCOUNTER — Encounter: Payer: Self-pay | Admitting: *Deleted

## 2014-10-24 ENCOUNTER — Ambulatory Visit (HOSPITAL_BASED_OUTPATIENT_CLINIC_OR_DEPARTMENT_OTHER): Payer: BLUE CROSS/BLUE SHIELD | Admitting: Hematology and Oncology

## 2014-10-24 VITALS — BP 124/70 | HR 89 | Temp 98.0°F | Resp 18 | Ht 67.0 in | Wt 175.9 lb

## 2014-10-24 DIAGNOSIS — I890 Lymphedema, not elsewhere classified: Secondary | ICD-10-CM

## 2014-10-24 DIAGNOSIS — E860 Dehydration: Secondary | ICD-10-CM

## 2014-10-24 DIAGNOSIS — C01 Malignant neoplasm of base of tongue: Secondary | ICD-10-CM

## 2014-10-24 DIAGNOSIS — L589 Radiodermatitis, unspecified: Secondary | ICD-10-CM

## 2014-10-25 DIAGNOSIS — I890 Lymphedema, not elsewhere classified: Secondary | ICD-10-CM | POA: Insufficient documentation

## 2014-10-25 NOTE — Progress Notes (Unsigned)
To provide support and encouragement, care continuity and to assess for needs, met with patient during est pt appt with Dr. Alvy Bimler. 1. She reported:  PCP has indicated that b/c of her weight loss, she can stop taking Metformin and BP meds.  She recognized this as a cancer treatment "silver lining".  Continuing dry mouth.  Difficulty swallowing meat; she is avoiding lettuce b/c of a previous choking episode when she at [place].  I encouraged her to continue trying different foods  Ability to taste "everything". 2. I recognized the significant progress she has made in just a month since tmt completion. 3. I presented her with "Strength" award from the recent H&N Recognition Evening that she was unable to attend. 4. She denied any needs or concerns; I encouraged her to contact me if that changes before I see her next, she verbalized understanding.  Alice Orem, RN, BSN, [place] at [place] [phone]

## 2014-10-25 NOTE — Assessment & Plan Note (Signed)
This has resolved. She is able to tolerate oral intake 100% by mouth. The PICC line was recently discontinued.

## 2014-10-25 NOTE — Assessment & Plan Note (Signed)
She has mild lymphedema around the neck from recent treatment. Recommend physical therapy for exercise and and massage around neck.

## 2014-10-25 NOTE — Assessment & Plan Note (Signed)
She is doing very well. She is recovering from most of her side effects from treatment. I will discharge the patient from the clinic. I recommend close follow-up with ENT and radiation oncology. She has imaging studies scheduled in 3 months.

## 2014-10-25 NOTE — Assessment & Plan Note (Signed)
This has resolved.

## 2014-10-25 NOTE — Progress Notes (Signed)
[place] OFFICE PROGRESS NOTE  Patient Care Team: Gaynelle Arabian, MD as PCP - General (Family Medicine) Leota Sauers, RN as Oncology Nurse [place], RD as Dietitian (Nutrition) Eppie Gibson, MD as Attending Physician (Radiation Oncology)  SUMMARY OF ONCOLOGIC HISTORY: Oncology History   Cancer of base of tongue   Staging form: Lip and Oral Cavity, AJCC 7th Edition     Clinical: Stage IVA (T3, N2b, M0) - Signed by Heath Lark, MD on 05/09/2014       Cancer of base of tongue   04/20/2014 Imaging CT neck showed advanced base of tongue mass and multiple right neck lymphadenopathy   04/23/2014 Pathology Results [id number] FNA of right neck is positive for squamous cell cancer, P16 positive   05/09/2014 Imaging PET/CT scan confirmed base of tongue cancer along with lymph node metastasis.   05/10/2014 Procedure She has placement of PICC line.   05/11/2014 - 06/26/2014 Chemotherapy She received 3 cycles of induction chemotherapy with Taxotere, cisplatin and 5-FU.   05/19/2014 - 05/22/2014 Hospital Admission She was admitted to the hospital for management of neutropenic fever, GI bleed requiring blood transfusion and diarrhea from chemotherapy   07/19/2014 Imaging Repeat PET/CT scan showed complete response to treatment   08/07/2014 - 09/24/2014 Radiation Therapy She received radiation treatment.    INTERVAL HISTORY: Please see below for problem oriented charting. She returns for further follow-up. She is doing very well since she was last seen. Most of the side effects are almost completely recover. She is able to tolerate oral intake 100% by mouth. She denies significant mucositis or sore throat. She had some dry mouth and mild dysphagia. She has some mild swelling around the neck. The skin rash has resolved.  REVIEW OF SYSTEMS:   Constitutional: Denies fevers, chills or abnormal weight loss Eyes: Denies blurriness of vision Ears, nose, mouth, throat,  and face: Denies mucositis or sore throat Respiratory: Denies cough, dyspnea or wheezes Cardiovascular: Denies palpitation, chest discomfort or lower extremity swelling Gastrointestinal:  Denies nausea, heartburn or change in bowel habits Skin: Denies abnormal skin rashes Lymphatics: Denies new lymphadenopathy or easy bruising Neurological:Denies numbness, tingling or new weaknesses Behavioral/Psych: Mood is stable, no new changes  All other systems were reviewed with the patient and are negative.  I have reviewed the past medical history, past surgical history, social history and family history with the patient and they are unchanged from previous note.  ALLERGIES:  is allergic to penicillins.  MEDICATIONS:  Current Outpatient Prescriptions  Medication Sig Dispense Refill  . ALPRAZolam (XANAX) 0.25 MG tablet Take 0.25 mg by mouth as needed for anxiety.    . Alum & Mag Hydroxide-Simeth (MAGIC MOUTHWASH) SOLN Take 5 mLs by mouth 4 (four) times daily as needed for mouth pain. 500 mL 6  . amLODipine (NORVASC) 10 MG tablet Take 10 mg by mouth daily.    Marland Kitchen aspirin 81 MG tablet Take 81 mg by mouth daily.    Marland Kitchen atorvastatin (LIPITOR) 10 MG tablet Take 10 mg by mouth daily.    Marland Kitchen emollient (BIAFINE) cream Apply topically 2 (two) times daily.    . Prenatal Vit-Fe Fumarate-FA (MULTIVITAMIN-PRENATAL) 27-0.8 MG TABS tablet Take 1 tablet by mouth daily at 12 noon.    . sennosides-docusate sodium (SENOKOT-S) 8.6-50 MG tablet Take 2 tablets by mouth 2 (two) times daily.    . sodium fluoride (FLUORISHIELD) 1.1 % GEL dental gel Instill one drop of gel per tooth space of fluoride tray. Place  over teeth for 5 minutes. Remove. Spit out excess. Repeat nightly. 120 mL prn   No current facility-administered medications for this visit.    PHYSICAL EXAMINATION: ECOG PERFORMANCE STATUS: 0 - Asymptomatic  Filed Vitals:   10/24/14 1005  BP: 124/70  Pulse: 89  Temp: 98 F (36.7 C)  Resp: 18   Filed Weights    10/24/14 1005  Weight: 175 lb 14.4 oz (79.788 kg)    GENERAL:alert, no distress and comfortable SKIN: skin color, texture, turgor are normal, no rashes or significant lesions EYES: normal, Conjunctiva are pink and non-injected, sclera clear OROPHARYNX:no exudate, no erythema and lips, buccal mucosa, and tongue normal  NECK: Mild lymphedema is noted around the neck.  LYMPH:  no palpable lymphadenopathy in the cervical, axillary or inguinal LUNGS: clear to auscultation and percussion with normal breathing effort HEART: regular rate & rhythm and no murmurs and no lower extremity edema ABDOMEN:abdomen soft, non-tender and normal bowel sounds Musculoskeletal:no cyanosis of digits and no clubbing  NEURO: alert & oriented x 3 with fluent speech, no focal motor/sensory deficits  LABORATORY DATA:  I have reviewed the data as listed    Component Value Date/Time   NA 138 09/06/2014 0910   NA 138 05/22/2014 0445   K 4.1 09/06/2014 0910   K 3.0* 05/22/2014 0445   CL 101 05/22/2014 0445   CO2 27 09/06/2014 0910   CO2 26 05/22/2014 0445   GLUCOSE 124 09/06/2014 0910   GLUCOSE 144* 05/22/2014 0445   BUN 9.3 09/06/2014 0910   BUN 5* 05/22/2014 0445   CREATININE 0.7 09/06/2014 0910   CREATININE 0.66 05/22/2014 0445   CALCIUM 9.6 09/06/2014 0910   CALCIUM 8.1* 05/22/2014 0445   PROT 7.6 09/06/2014 0910   PROT 5.9* 05/20/2014 0600   ALBUMIN 3.8 09/06/2014 0910   ALBUMIN 2.5* 05/20/2014 0600   AST 17 09/06/2014 0910   AST 10 05/20/2014 0600   ALT 20 09/06/2014 0910   ALT 17 05/20/2014 0600   ALKPHOS 83 09/06/2014 0910   ALKPHOS 67 05/20/2014 0600   BILITOT 0.34 09/06/2014 0910   BILITOT 0.3 05/20/2014 0600   GFRNONAA >90 05/22/2014 0445   GFRAA >90 05/22/2014 0445    No results found for: SPEP, UPEP  Lab Results  Component Value Date   WBC 4.8 09/06/2014   NEUTROABS 3.8 09/06/2014   HGB 12.0 09/06/2014   HCT 36.2 09/06/2014   MCV 93.8 09/06/2014   PLT 188 09/06/2014       Chemistry      Component Value Date/Time   NA 138 09/06/2014 0910   NA 138 05/22/2014 0445   K 4.1 09/06/2014 0910   K 3.0* 05/22/2014 0445   CL 101 05/22/2014 0445   CO2 27 09/06/2014 0910   CO2 26 05/22/2014 0445   BUN 9.3 09/06/2014 0910   BUN 5* 05/22/2014 0445   CREATININE 0.7 09/06/2014 0910   CREATININE 0.66 05/22/2014 0445      Component Value Date/Time   CALCIUM 9.6 09/06/2014 0910   CALCIUM 8.1* 05/22/2014 0445   ALKPHOS 83 09/06/2014 0910   ALKPHOS 67 05/20/2014 0600   AST 17 09/06/2014 0910   AST 10 05/20/2014 0600   ALT 20 09/06/2014 0910   ALT 17 05/20/2014 0600   BILITOT 0.34 09/06/2014 0910   BILITOT 0.3 05/20/2014 0600     ASSESSMENT & PLAN:  Cancer of base of tongue She is doing very well. She is recovering from most of her side effects from treatment.  I will discharge the patient from the clinic. I recommend close follow-up with ENT and radiation oncology. She has imaging studies scheduled in 3 months.   Dehydration This has resolved. She is able to tolerate oral intake 100% by mouth. The PICC line was recently discontinued.   Radiation-induced dermatitis This has resolved.   Lymphedema She has mild lymphedema around the neck from recent treatment. Recommend physical therapy for exercise and and massage around neck.    No orders of the defined types were placed in this encounter.   All questions were answered. The patient knows to call the clinic with any problems, questions or concerns. No barriers to learning was detected. I spent 15 minutes counseling the patient face to face. The total time spent in the appointment was 20 minutes and more than 50% was on counseling and review of test results     [facility], [place], MD 10/25/2014 9:28 AM

## 2014-10-26 ENCOUNTER — Telehealth: Payer: Self-pay | Admitting: *Deleted

## 2014-10-26 ENCOUNTER — Telehealth: Payer: Self-pay

## 2014-10-26 ENCOUNTER — Other Ambulatory Visit: Payer: Self-pay | Admitting: *Deleted

## 2014-10-26 DIAGNOSIS — C01 Malignant neoplasm of base of tongue: Secondary | ICD-10-CM

## 2014-10-26 NOTE — Telephone Encounter (Signed)
In response to patient's call and her questions re: appt for lymphedema, I returned her call to notify that I had placed an order with [facility] for her to see Serafina Royals, PT, for follow-up, that she can expect a call early next week to schedule an appt.  She verbalized understanding.  [name], RN, BSN, [place] at [place] [phone]

## 2014-10-26 NOTE — Telephone Encounter (Signed)
Pt called clinic wondering about swelling in her neck and requested a return call, due to [facility] d/c patient.  SLP has not seen pt since initial eval 07-23-14 due to pt cancelations.  SLP looked at pt's chart from 10/25/14. Dr. Alvy Bimler recommended PHYSICAL THERAPY for lymphedema. SLP left message on voice mail reminding pt of 1) rec of Dr. Alvy Bimler for PT for lymphedema and 2) that pt needed to schedule follow up for dysphagia therapy on 11-05-14, and 3) referred pt back to [facility] for clarification for PT recommendation. SLP provided phone number for PT cancer rehab as well.

## 2014-10-30 ENCOUNTER — Ambulatory Visit: Payer: BLUE CROSS/BLUE SHIELD | Attending: Radiation Oncology | Admitting: Physical Therapy

## 2014-10-30 DIAGNOSIS — Z5189 Encounter for other specified aftercare: Secondary | ICD-10-CM | POA: Diagnosis not present

## 2014-10-30 DIAGNOSIS — I890 Lymphedema, not elsewhere classified: Secondary | ICD-10-CM

## 2014-10-30 DIAGNOSIS — M436 Torticollis: Secondary | ICD-10-CM | POA: Diagnosis not present

## 2014-10-30 NOTE — Therapy (Signed)
[place], [place], [address] Phone: [phone]   Fax:  [phone]  Physical Therapy Evaluation  Patient Details  Name: [name] MRN: [id number] Date of Birth: 06-Nov-1955 Referring Provider:  Eppie Gibson, MD  Encounter Date: 10/30/2014      PT End of Session - 10/30/14 1223    Visit Number 1   Number of Visits 9   Date for PT Re-Evaluation 11/30/14   PT Start Time 8841   PT Stop Time 1100   PT Time Calculation (min) 45 min   Activity Tolerance Patient tolerated treatment well   Behavior During Therapy [facility] for tasks assessed/performed      Past Medical History  Diagnosis Date  . Hypertension   . Diabetes mellitus without complication     "borderline diabetic"   . Hypercholesteremia   . Diabetes mellitus type 2, controlled, without complications 66/11/3014  . Cancer of tongue   . Anemia in neoplastic disease 06/01/2014  . GERD (gastroesophageal reflux disease) 06/08/2014  . Cancer     tongue  . Yeast infection of the skin 08/15/2014  . History of radiation therapy 08/07/14- 09/24/14    tongue, 70 Gy    Past Surgical History  Procedure Laterality Date  . Cervical ablation      Uterus  . Nasal fracture surgery  0109    Dr. Erik Obey    There were no vitals filed for this visit.  Visit Diagnosis:  Lymphedema  Stiffness of neck      Subjective Assessment - 10/30/14 1029    Subjective finsihed radation treatment April 4.  MD encouraged her to come because of the swelling in her neck   Pertinent History Pt. with 6 week h/o feeling "like something was in her throat" and a lump in her neck presented 04/23/14.  Diagnosed with malignant cells consistent with squamous cell carcinoma by fine needle aspiration.  CT showed several level II and II suspicious lymph nodes.She the underwernt chemo with 3 aggressive treaments and 35 treatments of  radiation that completed September 24, 2014   Patient  Stated Goals wants to do everything she can to make her feels better    Currently in Pain? No/denies  sometimes pain in throat with eating             [facility] PT Assessment - 10/30/14 0001    Assessment   Medical Diagnosis squamous cell carcinoma right base of tongue   Onset Date 04/23/14   Precautions   Precautions Other (comment)   Precaution Comments cancer with chemo and radation   Restrictions   Weight Bearing Restrictions No   Balance Screen   Has the patient fallen in the past 6 months No   Has the patient had a decrease in activity level because of a fear of falling?  No   Is the patient reluctant to leave their home because of a fear of falling?  No   Prior Function   Level of Independence Independent with basic ADLs;Independent with gait;Independent with homemaking with ambulation;Independent with transfers   Leisure starting to walk around the neighborhood, does antiquing , yard work    Charity fundraiser Status Within Functional Limits for tasks assessed   Sit to Stand   Comments 14   Posture/Postural Control   Posture/Postural Control Postural limitations   Postural Limitations Forward head   AROM   Cervical Flexion 60   Cervical Extension 41   Cervical - Right  Side Bend 38   Cervical - Left Side Bend 45   Cervical - Right Rotation 63   Cervical - Left Rotation 65           LYMPHEDEMA/ONCOLOGY QUESTIONNAIRE - 10/30/14 1041    Head and Neck   4 cm superior to sternal notch around neck 39.2 cm   6 cm superior to sternal notch around neck 38.3 cm   8 cm superior to sternal notch around neck 40 cm  pt has lost ~ 28 pounds since initial session                 [facility] Adult PT Treatment/Exercise - 10/30/14 0001    Neck Exercises: Seated   Lateral Flexion Both;5 reps   Other Seated Exercise neck extension with jaw protraction                 PT Education - 10/30/14 1225    Education provided Yes   Education Details cervical  exercise   Person(s) Educated Patient   Methods Explanation;Demonstration   Comprehension Verbalized understanding;Returned demonstration           Short Term Clinic Goals - 10/30/14 1229    CC Short Term Goal  #1   Title short term goals=long term goals             Long Term Clinic Goals - 10/30/14 1229    CC Long Term Goal  #1   Title Patient with verbalize an understanding of lymphedema risk reduction precautions   Time 4   Period Weeks   Status New   CC Long Term Goal  #2   Title Patient will be independent in self manual lymph drainage   Time 4   Period Weeks   Status New   CC Long Term Goal  #3   Title Patient will be independent in a basic home exercise program   Time 4   Period Weeks   Status New   CC Long Term Goal  #4   Title Patient will be know how to  use compression garments for maintenance phase of treatment   Time 4   Period Weeks   Status New   CC Long Term Goal  #5   Title Patient will have a circumferential reduction of    1       cm at     8 cm above the    sternal notch   Baseline 40 cm   Time 4   Period Weeks   Status New         Head and Neck Clinic Goals - 05/09/14 1520    Patient will be able to verbalize understanding of a home exercise program for cervical range of motion, posture, and walking.    Status Achieved   Patient will be able to verbalize understanding of proper sitting and standing posture.    Status Achieved   Patient will be able to verbalize understanding of lymphedema risk and availability of treatment for this condition.    Status Achieved           Plan - 10/30/14 1055    Clinical Impression Statement Pt completed chemotherapy and radiation for tongue cancer presents to our clinic with neck lymphedema, decreased cervical range of motion and c/o general fatigue.  She was given a brochure for the [facility] [employer] program    Pt will benefit from skilled therapeutic intervention in order to  improve on the following deficits Decreased range  of motion;Increased edema;Decreased knowledge of use of DME;Decreased knowledge of precautions   Rehab Potential Excellent   Clinical Impairments Affecting Rehab Potential radiation therapy    PT Frequency 2x / week   PT Duration 4 weeks   PT Treatment/Interventions Therapeutic exercise;Patient/family education;Compression bandaging;ADLs/Self Care Home Management;DME Instruction;Manual lymph drainage   PT Next Visit Plan manual lymph draianage with instruction, issue Klose DVD, lymphatic flow youtube link with instruciton of cervical range of motion   Consulted and Agree with Plan of Care Patient         Problem List Patient Active Problem List   Diagnosis Date Noted  . Lymphedema 10/25/2014  . Constipation 09/18/2014  . Nausea without vomiting 09/18/2014  . Dehydration 09/06/2014  . Radiation-induced dermatitis 09/06/2014  . Mucositis due to radiation therapy 09/06/2014  . Yeast infection of the skin 08/15/2014  . Thyroid nodule 07/20/2014  . Neuropathy due to chemotherapeutic drug 06/21/2014  . Other constipation 06/21/2014  . GERD (gastroesophageal reflux disease) 06/08/2014  . Leukopenia due to antineoplastic chemotherapy 06/08/2014  . Mucositis due to chemotherapy 06/08/2014  . Anemia in neoplastic disease 06/01/2014  . Diarrhea 05/23/2014  . Hypertension 05/19/2014  . S/P PICC central line placement 05/16/2014  . Cancer of base of tongue 05/09/2014  . Diabetes mellitus type 2, controlled, without complications 97/53/0051   Donato Heinz. Owens Shark, PT  10/30/2014, 12:35 PM  [place], [place], [address] Phone: [phone]   Fax:  [phone]

## 2014-10-31 ENCOUNTER — Ambulatory Visit: Payer: BLUE CROSS/BLUE SHIELD

## 2014-11-02 ENCOUNTER — Ambulatory Visit: Payer: BLUE CROSS/BLUE SHIELD

## 2014-11-06 ENCOUNTER — Ambulatory Visit: Payer: BLUE CROSS/BLUE SHIELD

## 2014-11-06 DIAGNOSIS — Z5189 Encounter for other specified aftercare: Secondary | ICD-10-CM | POA: Diagnosis not present

## 2014-11-06 DIAGNOSIS — R1310 Dysphagia, unspecified: Secondary | ICD-10-CM

## 2014-11-06 DIAGNOSIS — I890 Lymphedema, not elsewhere classified: Secondary | ICD-10-CM

## 2014-11-06 DIAGNOSIS — M436 Torticollis: Secondary | ICD-10-CM

## 2014-11-06 NOTE — Patient Instructions (Addendum)
Manual Lymph Drainage for face/neck    1) Place hands on areas just above collar bones and do 15-20 stationary circles 2) Place hands on either side of neck and do 15-20 stationary circles  3) Do stationary circles at both shoulders (about 10 times) 4) Do stationary circles on both armpit areas (about 10 times) 5) Standing at the head of the bed, do stationary circles on each side of the neck just below the jaw line (15-20) 6) Standing at the head of the bed, do downward circles just underneath the chin (15-20) 7) Standing at the head of the bed, do stationary circles on each side of the face on the cheeks just above the jaw line (15-20) 8) Standing at the head of the bed, do stationary downward circles on each side of the face between the eyes and ears (15-20) 9) Do downward circles under the eyes just above the cheeks (15-20) 10) Repeat step 2 11) Repeat step 1     Do not slide on the skin Only give enough pressure to stretch the skin Make sure to always wash your hands prior to [place] [address] [place], [place] [address] [phone]

## 2014-11-06 NOTE — Therapy (Signed)
[place], [place], [address] Phone: [phone]   Fax:  [phone]  Physical Therapy Treatment  Patient Details  Name: [name] MRN: [id number] Date of Birth: 1955-12-01 Referring Provider:  Eppie Gibson, MD  Encounter Date: 11/06/2014      PT End of Session - 11/06/14 1136    Visit Number 2   Number of Visits 9   Date for PT Re-Evaluation 11/30/14   PT Start Time 0943   PT Stop Time 1027   PT Time Calculation (min) 44 min      Past Medical History  Diagnosis Date  . Hypertension   . Diabetes mellitus without complication     "borderline diabetic"   . Hypercholesteremia   . Diabetes mellitus type 2, controlled, without complications 46/27/0350  . Cancer of tongue   . Anemia in neoplastic disease 06/01/2014  . GERD (gastroesophageal reflux disease) 06/08/2014  . Cancer     tongue  . Yeast infection of the skin 08/15/2014  . History of radiation therapy 08/07/14- 09/24/14    tongue, 70 Gy    Past Surgical History  Procedure Laterality Date  . Cervical ablation      Uterus  . Nasal fracture surgery  0938    Dr. Erik Obey    There were no vitals filed for this visit.  Visit Diagnosis:  Lymphedema  Stiffness of neck  Dysphagia      Subjective Assessment - 11/06/14 0944    Subjective No complaints, just want to get the fluid out of my neck.   Currently in Pain? No/denies                         [facility] Adult PT Treatment/Exercise - 11/06/14 0001    Manual Therapy   Manual Lymphatic Drainage (MLD) In Supine: Short and long neck, bil shoulder collectors and bil axillae nodes, superficial and deep abdominals and bil upper quadrants, then anterior throat, submental and submandibular nodes, bil masseters, pre- and retroauricular nodes and suboccipital nodes all directing towards lateral neck instructing pt throughout.    Passive ROM In supine suboccipital release, bil  side bends and rotation with overstretch to ipsilateral clavicle and cervical retraction   Other Manual Therapy Made chip pack for pt to wear at anterior throat and instructed to wear 2-4 hours a day.                 PT Education - 11/06/14 1135    Education provided Yes   Education Details Self manual lymph drainage and issued Celine Ahr DVD    Methods Explanation;Demonstration;Handout;Tactile cues;Verbal cues   Comprehension Verbalized understanding;Need further instruction           Short Term Clinic Goals - 10/30/14 1229    CC Short Term Goal  #1   Title short term goals=long term goals             Long Term Clinic Goals - 10/30/14 1229    CC Long Term Goal  #1   Title Patient with verbalize an understanding of lymphedema risk reduction precautions   Time 4   Period Weeks   Status New   CC Long Term Goal  #2   Title Patient will be independent in self manual lymph drainage   Time 4   Period Weeks   Status New   CC Long Term Goal  #3   Title Patient will be independent in a basic  home exercise program   Time 4   Period Weeks   Status New   CC Long Term Goal  #4   Title Patient will be know how to  use compression garments for maintenance phase of treatment   Time 4   Period Weeks   Status New   CC Long Term Goal  #5   Title Patient will have a circumferential reduction of    1       cm at     8 cm above the    sternal notch   Baseline 40 cm   Time 4   Period Weeks   Status New         Head and Neck Clinic Goals - 05/09/14 1520    Patient will be able to verbalize understanding of a home exercise program for cervical range of motion, posture, and walking.    Status Achieved   Patient will be able to verbalize understanding of proper sitting and standing posture.    Status Achieved   Patient will be able to verbalize understanding of lymphedema risk and availability of treatment for this condition.    Status Achieved            Plan - 11/06/14 1138    Clinical Impression Statement Pt tolerated treatment very well today and able to verbalize good initial understanding of principles of manual lymph drainage. Also reported benefit of stretching after treatment as well.    Pt will benefit from skilled therapeutic intervention in order to improve on the following deficits Decreased range of motion;Increased edema;Decreased knowledge of use of DME;Decreased knowledge of precautions   Rehab Potential Excellent   Clinical Impairments Affecting Rehab Potential radiation therapy    PT Frequency 2x / week   PT Duration 4 weeks   PT Treatment/Interventions Therapeutic exercise;Patient/family education;Compression bandaging;ADLs/Self Care Home Management;DME Instruction;Manual lymph drainage   PT Next Visit Plan Remeasure and continue manual lymph draianage with continued instruction and have pt try, issue lymphatic flow youtube link with instruciton of cervical range of motion. Assess how chip pack did.    PT Home Exercise Plan Celine Ahr DVD and chip pack   Consulted and Agree with Plan of Care Patient        Problem List Patient Active Problem List   Diagnosis Date Noted  . Lymphedema 10/25/2014  . Constipation 09/18/2014  . Nausea without vomiting 09/18/2014  . Dehydration 09/06/2014  . Radiation-induced dermatitis 09/06/2014  . Mucositis due to radiation therapy 09/06/2014  . Yeast infection of the skin 08/15/2014  . Thyroid nodule 07/20/2014  . Neuropathy due to chemotherapeutic drug 06/21/2014  . Other constipation 06/21/2014  . GERD (gastroesophageal reflux disease) 06/08/2014  . Leukopenia due to antineoplastic chemotherapy 06/08/2014  . Mucositis due to chemotherapy 06/08/2014  . Anemia in neoplastic disease 06/01/2014  . Diarrhea 05/23/2014  . Hypertension 05/19/2014  . S/P PICC central line placement 05/16/2014  . Cancer of base of tongue 05/09/2014  . Diabetes mellitus type 2, controlled, without  complications 27/51/7001    Otelia Limes, PTA 11/06/2014, 11:41 AM  [place], [place], [address] Phone: [phone]   Fax:  [phone]

## 2014-11-08 ENCOUNTER — Ambulatory Visit: Payer: BLUE CROSS/BLUE SHIELD

## 2014-11-09 ENCOUNTER — Ambulatory Visit: Payer: BLUE CROSS/BLUE SHIELD

## 2014-11-09 DIAGNOSIS — Z5189 Encounter for other specified aftercare: Secondary | ICD-10-CM | POA: Diagnosis not present

## 2014-11-09 DIAGNOSIS — R1310 Dysphagia, unspecified: Secondary | ICD-10-CM

## 2014-11-09 NOTE — Therapy (Signed)
[facility] [address] [place], [place], [address] Phone: [phone]   Fax:  [phone]  Speech Language Pathology Treatment  Patient Details  Name: [name] MRN: [id number] Date of Birth: 1956-01-10 Referring Provider:  Gaynelle Arabian, MD  Encounter Date: 11/09/2014      End of Session - 11/09/14 1022    Visit Number 3   Number of Visits 6   Date for SLP Re-Evaluation 01/12/15   SLP Start Time 0934   SLP Stop Time  1020   SLP Time Calculation (min) 46 min   Activity Tolerance Patient tolerated treatment well      Past Medical History  Diagnosis Date  . Hypertension   . Diabetes mellitus without complication     "borderline diabetic"   . Hypercholesteremia   . Diabetes mellitus type 2, controlled, without complications 25/10/3974  . Cancer of tongue   . Anemia in neoplastic disease 06/01/2014  . GERD (gastroesophageal reflux disease) 06/08/2014  . Cancer     tongue  . Yeast infection of the skin 08/15/2014  . History of radiation therapy 08/07/14- 09/24/14    tongue, 70 Gy    Past Surgical History  Procedure Laterality Date  . Cervical ablation      Uterus  . Nasal fracture surgery  7341    Dr. Erik Obey    There were no vitals filed for this visit.  Visit Diagnosis: Dysphagia      Subjective Assessment - 11/09/14 0939    Subjective For the last three or four days my throat has been giving me a fit. April 4th last rad tx.               ADULT SLP TREATMENT - 11/09/14 0944    General Information   Behavior/Cognition Alert;Cooperative;Pleasant mood   HPI Pt tried slaw and pasta with red sauce with irritation.    Treatment Provided   Treatment provided Dysphagia   Dysphagia Treatment   Treatment Methods Therapeutic exercise;Skilled observation   Patient observed directly with PO's Yes   Type of PO's observed Thin liquids   Other treatment/comments No overt s/s aspiration today  with water. Pt does not report s/s aspiration PNA. HEP completed with rare verbal cues needed from SLP; added open mouth exercise with good success procedurally. SLP suggested incr'd fluids in attempts to thin saliva, rec cont PO trials and discussed keeping food journal with pt, which she agreed would be helpful for her at this time.  SLP discussed s/s aspiratoin PNA with pt today as well.   Pain Assessment   Pain Assessment No/denies pain   Assessment / Recommendations / Plan   Plan Continue with current plan of care   Dysphagia Recommendations   Diet recommendations --  diet as tolerated, thin liquids   Progression Toward Goals   Progression toward goals Progressing toward goals     SLP assisted pt with       SLP Education - 11/09/14 1026    Education provided Yes   Education Details s/s aspiration PNA, dysaphagia diets handout to A pt with food choices with appropriate textures, HEP   Person(s) Educated Patient   Methods Explanation;Demonstration;Handout   Comprehension Verbalized understanding;Returned demonstration;Verbal cues required          SLP Short Term Goals - 11/09/14 1024    SLP SHORT TERM GOAL #1   Title pt will complete HEP with min A   Period --  therapy visits   Status  Achieved   SLP SHORT TERM GOAL #2   Title pt will tell SLP why she is completing HEP   Period --  therapy visits   Status Achieved          SLP Long Term Goals - 11/09/14 1025    SLP LONG TERM GOAL #1   Title pt will tell SLP why food journal can be helpful for return to full PO diet following head/neck radiation   Time 4   Period --  visits   Status On-going   SLP LONG TERM GOAL #2   Title pt will complete HEP with modified assistance over 4 sessions in order to maximize opportunity for WNL swallowing over time   Time 4   Period --  visits   Status On-going   SLP LONG TERM GOAL #3   Title pt will tell SLP 3 s/s/ aspiration PNA to minimize pt risk for pulmonary complications  following head/neck radiation   Time 4   Period --  visits   Status On-going          Plan - 11/09/14 1022    Clinical Impression Statement Pt with good success with HEP, req'd verbal and demo cues for vocal adduction. Open mouth exercise taught today. SLP assisted pt with food choices. Skilled ST remains needed to cont to assess procedural success with HEP as well as to assess for safety with POs.   Speech Therapy Frequency --  approx every 4 weeks   Duration --  3-4 more visits   Treatment/Interventions Pharyngeal strengthening exercises;Oral motor exercises;SLP instruction and feedback;Patient/family education;Trials of upgraded texture/liquids   Potential to Achieve Goals Good        Problem List Patient Active Problem List   Diagnosis Date Noted  . Lymphedema 10/25/2014  . Constipation 09/18/2014  . Nausea without vomiting 09/18/2014  . Dehydration 09/06/2014  . Radiation-induced dermatitis 09/06/2014  . Mucositis due to radiation therapy 09/06/2014  . Yeast infection of the skin 08/15/2014  . Thyroid nodule 07/20/2014  . Neuropathy due to chemotherapeutic drug 06/21/2014  . Other constipation 06/21/2014  . GERD (gastroesophageal reflux disease) 06/08/2014  . Leukopenia due to antineoplastic chemotherapy 06/08/2014  . Mucositis due to chemotherapy 06/08/2014  . Anemia in neoplastic disease 06/01/2014  . Diarrhea 05/23/2014  . Hypertension 05/19/2014  . S/P PICC central line placement 05/16/2014  . Cancer of base of tongue 05/09/2014  . Diabetes mellitus type 2, controlled, without complications 46/50/3546    [facility] , [place], [place]  11/09/2014, 5:09 PM  [place] [address] [place], [place], [address] Phone: [phone]   Fax:  [phone]

## 2014-11-09 NOTE — Patient Instructions (Signed)
Add this exercise to your regimen:  Open mouth as wide as you can and hold for 10 seconds. Repeat 5 times, 2-3 times a day  ============================================================================ Signs of Aspiration Pneumonia   . Chest pain/tightness . Fever (can be low grade) . Cough  o With foul-smelling phlegm (sputum) o With sputum containing pus or blood o With greenish sputum . Fatigue  . Shortness of breath  . Wheezing   **IF YOU HAVE THESE SIGNS, CONTACT YOUR DOCTOR OR GO TO THE EMERGENCY DEPARTMENT OR URGENT CARE AS SOON AS POSSIBLE**

## 2014-11-13 ENCOUNTER — Ambulatory Visit: Payer: BLUE CROSS/BLUE SHIELD

## 2014-11-14 ENCOUNTER — Encounter: Payer: Self-pay | Admitting: Radiation Oncology

## 2014-11-14 NOTE — Progress Notes (Signed)
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    She had significant weight loss and thickened saliva, and received nutritional advice, IV fluids, scopolamine.                    PLAN: Routine followup in one half month. Patient instructed to call if questions or worsening complaints in interim.  -----------------------------------  Eppie Gibson, MD

## 2014-11-16 ENCOUNTER — Ambulatory Visit: Payer: BLUE CROSS/BLUE SHIELD | Admitting: Physical Therapy

## 2014-11-16 DIAGNOSIS — Z5189 Encounter for other specified aftercare: Secondary | ICD-10-CM | POA: Diagnosis not present

## 2014-11-16 DIAGNOSIS — M436 Torticollis: Secondary | ICD-10-CM

## 2014-11-16 DIAGNOSIS — I890 Lymphedema, not elsewhere classified: Secondary | ICD-10-CM

## 2014-11-16 NOTE — Patient Instructions (Signed)
[email or web address] Lymphatic flow series with shoosh

## 2014-11-16 NOTE — Therapy (Signed)
[place], [place], [address] Phone: [phone]   Fax:  [phone]  Physical Therapy Treatment  Patient Details  Name: [name] MRN: [id number] Date of Birth: [birth year] Referring Provider:  Eppie Gibson, MD  Encounter Date: 11/16/2014      PT End of Session - 11/16/14 1310    Visit Number 3   Number of Visits 9   Date for PT Re-Evaluation 11/30/14   PT Start Time 0800   PT Stop Time 0845   PT Time Calculation (min) 45 min      Past Medical History  Diagnosis Date  . Hypertension   . Diabetes mellitus without complication     "borderline diabetic"   . Hypercholesteremia   . Diabetes mellitus type 2, controlled, without complications 45/62/5638  . Cancer of tongue   . Anemia in neoplastic disease 06/01/2014  . GERD (gastroesophageal reflux disease) 06/08/2014  . Cancer     tongue  . Yeast infection of the skin 08/15/2014  . History of radiation therapy 08/07/14- 09/24/14    tongue, 70 Gy    Past Surgical History  Procedure Laterality Date  . Cervical ablation      Uterus  . Nasal fracture surgery  9373    Dr. Erik Obey    There were no vitals filed for this visit.  Visit Diagnosis:  Lymphedema  Stiffness of neck                       [facility] Adult PT Treatment/Exercise - 11/16/14 0001    Neck Exercises: Seated   Other Seated Exercise lymphatic flow series   Other Seated Exercise neck extension with jaw protraction    Shoulder Exercises: Supine   Other Supine Exercises scapular strengthening with red theraband   Manual Therapy   Manual Lymphatic Drainage (MLD) Instructed while perfomed, short neck, diphragmatic breathing, axillar nodes, anterior through and submandibulare nodesmoving toward axilla    Other Manual Therapy issued 10 cm short stretch band with chip pack in tape and instructed in wear                PT Education - 11/16/14 1306    Education provided Yes   Education Details lymphatic flow yoga link and exercise. supine scapular exericse with red theraband    Person(s) Educated Patient   Methods Explanation;Demonstration;Handout   Comprehension Verbalized understanding;Returned demonstration;Need further instruction           Short Term Clinic Goals - 10/30/14 1229    CC Short Term Goal  #1   Title short term goals=long term goals             Long Term Clinic Goals - 11/16/14 1314    CC Long Term Goal  #1   Title Patient with verbalize an understanding of lymphedema risk reduction precautions   Status On-going   CC Long Term Goal  #2   Status On-going   CC Long Term Goal  #3   Title Patient will be independent in a basic home exercise program   Status On-going   CC Long Term Goal  #4   Title Patient will be know how to  use compression garments for maintenance phase of treatment   Status On-going   CC Long Term Goal  #5   Title Patient will have a circumferential reduction of    1       cm at     8  cm above the    sternal notch   Status On-going         Head and Neck Clinic Goals - 05/09/14 1520    Patient will be able to verbalize understanding of a home exercise program for cervical range of motion, posture, and walking.    Status Achieved   Patient will be able to verbalize understanding of proper sitting and standing posture.    Status Achieved   Patient will be able to verbalize understanding of lymphedema risk and availability of treatment for this condition.    Status Achieved           Plan - 11/16/14 1311    Clinical Impression Statement Pt attentive and partripative in all eduction today of exercise, self manual lymph drainage and use of compression. She says she has lost 40 punds and continues to lose weight and that maybe she just has loose skin at her neck.  No firmness or fibrosis was palpated today. She is interested in a strengthening program and wants to  come one time a week    PT Next Visit Plan Remeasure and continue manual lymph draianage with continued instruction and have pt try  Assess how chip pack did. Review scapular exercise and consider strength ABC ???        Problem List Patient Active Problem List   Diagnosis Date Noted  . Lymphedema 10/25/2014  . Constipation 09/18/2014  . Nausea without vomiting 09/18/2014  . Dehydration 09/06/2014  . Radiation-induced dermatitis 09/06/2014  . Mucositis due to radiation therapy 09/06/2014  . Yeast infection of the skin 08/15/2014  . Thyroid nodule 07/20/2014  . Neuropathy due to chemotherapeutic drug 06/21/2014  . Other constipation 06/21/2014  . GERD (gastroesophageal reflux disease) 06/08/2014  . Leukopenia due to antineoplastic chemotherapy 06/08/2014  . Mucositis due to chemotherapy 06/08/2014  . Anemia in neoplastic disease 06/01/2014  . Diarrhea 05/23/2014  . Hypertension 05/19/2014  . S/P PICC central line placement 05/16/2014  . Cancer of base of tongue 05/09/2014  . Diabetes mellitus type 2, controlled, without complications 37/90/2409   Donato Heinz. Owens Shark, PT  11/16/2014, 1:15 PM  [place], [place], [address] Phone: [phone]   Fax:  [phone]

## 2014-11-21 ENCOUNTER — Ambulatory Visit: Payer: BLUE CROSS/BLUE SHIELD | Attending: Radiation Oncology | Admitting: Physical Therapy

## 2014-11-21 DIAGNOSIS — R1310 Dysphagia, unspecified: Secondary | ICD-10-CM | POA: Insufficient documentation

## 2014-11-21 DIAGNOSIS — M436 Torticollis: Secondary | ICD-10-CM

## 2014-11-21 DIAGNOSIS — I890 Lymphedema, not elsewhere classified: Secondary | ICD-10-CM

## 2014-11-21 NOTE — Therapy (Signed)
[place], [place], [address] Phone: [phone]   Fax:  [phone]  Physical Therapy Treatment  Patient Details  Name: [name] MRN: [id number] Date of Birth: [birth year] Referring Provider:  Gaynelle Arabian, MD  Encounter Date: 11/21/2014      PT End of Session - 11/21/14 1057    Visit Number 4   Number of Visits 9   Date for PT Re-Evaluation 11/30/14   PT Start Time 0848   PT Stop Time 0941   PT Time Calculation (min) 53 min   Activity Tolerance Patient tolerated treatment well   Behavior During Therapy [facility] for tasks assessed/performed      Past Medical History  Diagnosis Date  . Hypertension   . Diabetes mellitus without complication     "borderline diabetic"   . Hypercholesteremia   . Diabetes mellitus type 2, controlled, without complications 97/67/3419  . Cancer of tongue   . Anemia in neoplastic disease 06/01/2014  . GERD (gastroesophageal reflux disease) 06/08/2014  . Cancer     tongue  . Yeast infection of the skin 08/15/2014  . History of radiation therapy 08/07/14- 09/24/14    tongue, 70 Gy    Past Surgical History  Procedure Laterality Date  . Cervical ablation      Uterus  . Nasal fracture surgery  3790    Dr. Erik Obey    There were no vitals filed for this visit.  Visit Diagnosis:  Lymphedema  Stiffness of neck      Subjective Assessment - 11/21/14 0849    Subjective Still sometimes hard to cope with this.  Has done some watching the lymphedema yoga video and likes the neck exercises; has tried the manual lymph drainage but doesn't feel it's helping.  Has been wearing the foam chip pack an hour or so in the evening, and likes that better than the more recent one with the compression bandage.                                                          Currently in Pain? Yes   Pain Location Mouth   Pain Descriptors / Indicators --  dry mouth, uncomfortable feeling in  throat from dryness   Aggravating Factors  certain foods   Pain Relieving Factors drink as much water as I can, including during the night               LYMPHEDEMA/ONCOLOGY QUESTIONNAIRE - 11/21/14 0904    Head and Neck   4 cm superior to sternal notch around neck 39.7 cm  39.4 post MLD   6 cm superior to sternal notch around neck 39.9 cm  39.7 post MLD   8 cm superior to sternal notch around neck --  41 post MLD                  [facility] Adult PT Treatment/Exercise - 11/21/14 0001    Manual Therapy   Manual Therapy Edema management   Edema Management circumference meaurements taken pre- and post-manual lymph drainage during session today (although one pre-treatment measurement was accidentally erased in Epic)   Manual Lymphatic Drainage (MLD) In supine, supraclavicular fossae, bilateral shoulders, bilateral axillae; lateral, anterolateral, and anterior neck; posterolateral neck; then lateral neck, supraclavidular fossae, upper chest from  sternum toward shoulders, bilateral shoulders, and axillae                PT Education - 11/21/14 0902    Education provided Yes   Education Details about Head & Neck FYNN; about wearing foam chip pack and options to wear the first pack (foam chips in stockinette) but using the compression bandage wrap from the second set-up; discussed briefly the availability of manufactured garments and pt. saw [employer] brochure   Person(s) Educated Patient   Methods Explanation   Comprehension Verbalized understanding           Short Term Clinic Goals - 10/30/14 1229    CC Short Term Goal  #1   Title short term goals=long term goals             Long Term Clinic Goals - 11/21/14 1103    CC Long Term Goal  #2   Status On-going   CC Long Term Goal  #3   Status On-going   CC Long Term Goal  #4   Status On-going   CC Long Term Goal  #5   Status On-going         Head and Neck Clinic Goals - 05/09/14 1520    Patient will  be able to verbalize understanding of a home exercise program for cervical range of motion, posture, and walking.    Status Achieved   Patient will be able to verbalize understanding of proper sitting and standing posture.    Status Achieved   Patient will be able to verbalize understanding of lymphedema risk and availability of treatment for this condition.    Status Achieved           Plan - 11/21/14 1057    Clinical Impression Statement Patient did well today with manual lymph drainage that was performed showing immediate slight reductions in circumference measurements (I told patient I do think she has lymphedema); answered patient's questions about how/which foam chip pack to use (use what's most comfortable); patient has many questions about whether her recovery is "normal" so seems she could benefit from head & neck FYNN, which was discussed today.  She reports today that some of her concern about coming more than once a week stems from being unsure about what her insurance will cover, so our secretary was asked to help clarify this.   Pt will benefit from skilled therapeutic intervention in order to improve on the following deficits Decreased range of motion;Increased edema;Decreased knowledge of use of DME;Decreased knowledge of precautions   Rehab Potential Excellent   PT Frequency 2x / week   PT Duration 4 weeks   PT Treatment/Interventions Manual lymph drainage;Compression bandaging;Patient/family education;Therapeutic exercise;ADLs/Self Care Home Management;DME Instruction   PT Next Visit Plan continue manual lymph drainage and instruction; show patient samples and pictures of manufactured compression garments; progress to exercise once patient is confident about manual lymph drainage   PT Home Exercise Plan manual lymph drainage once or twice a day and continued use of chip pack one or more hours a day        Problem List Patient Active Problem List    Diagnosis Date Noted  . Lymphedema 10/25/2014  . Constipation 09/18/2014  . Nausea without vomiting 09/18/2014  . Dehydration 09/06/2014  . Radiation-induced dermatitis 09/06/2014  . Mucositis due to radiation therapy 09/06/2014  . Yeast infection of the skin 08/15/2014  . Thyroid nodule 07/20/2014  . Neuropathy due to chemotherapeutic drug 06/21/2014  . Other constipation  06/21/2014  . GERD (gastroesophageal reflux disease) 06/08/2014  . Leukopenia due to antineoplastic chemotherapy 06/08/2014  . Mucositis due to chemotherapy 06/08/2014  . Anemia in neoplastic disease 06/01/2014  . Diarrhea 05/23/2014  . Hypertension 05/19/2014  . S/P PICC central line placement 05/16/2014  . Cancer of base of tongue 05/09/2014  . Diabetes mellitus type 2, controlled, without complications 76/80/8811    SALISBURY,DONNA 11/21/2014, 11:04 AM  [place], [place], [address] Phone: [phone]   Fax:  [place], PT 11/21/2014 11:05 AM

## 2014-11-26 ENCOUNTER — Telehealth (HOSPITAL_COMMUNITY): Payer: Self-pay

## 2014-11-26 ENCOUNTER — Ambulatory Visit (HOSPITAL_COMMUNITY): Payer: Self-pay | Admitting: Dentistry

## 2014-11-26 NOTE — Telephone Encounter (Signed)
11/26/14           Broken appt. on 11/26/14 @ 9:15 for F/U appt. w/Dr. Enrique Sack.  Patient called sick and unable to keep appt.   Patient will call back at a later date to reschedule.  LRI

## 2014-11-27 ENCOUNTER — Ambulatory Visit: Payer: BLUE CROSS/BLUE SHIELD

## 2014-11-27 DIAGNOSIS — I890 Lymphedema, not elsewhere classified: Secondary | ICD-10-CM

## 2014-11-27 DIAGNOSIS — M436 Torticollis: Secondary | ICD-10-CM

## 2014-11-27 DIAGNOSIS — R1310 Dysphagia, unspecified: Secondary | ICD-10-CM

## 2014-11-27 NOTE — Therapy (Signed)
[place], [place], [address] Phone: [phone]   Fax:  [phone]  Physical Therapy Treatment  Patient Details  Name: [name] MRN: [id number] Date of Birth: [birth year] Referring Provider:  Gaynelle Arabian, MD  Encounter Date: 11/27/2014      PT End of Session - 11/27/14 0849    Visit Number 5   Number of Visits 9   Date for PT Re-Evaluation 11/30/14   PT Start Time 0800   PT Stop Time 0845   PT Time Calculation (min) 45 min   Activity Tolerance Patient tolerated treatment well   Behavior During Therapy [facility] for tasks assessed/performed      Past Medical History  Diagnosis Date  . Hypertension   . Diabetes mellitus without complication     "borderline diabetic"   . Hypercholesteremia   . Diabetes mellitus type 2, controlled, without complications 34/74/2595  . Cancer of tongue   . Anemia in neoplastic disease 06/01/2014  . GERD (gastroesophageal reflux disease) 06/08/2014  . Cancer     tongue  . Yeast infection of the skin 08/15/2014  . History of radiation therapy 08/07/14- 09/24/14    tongue, 70 Gy    Past Surgical History  Procedure Laterality Date  . Cervical ablation      Uterus  . Nasal fracture surgery  6387    Dr. Erik Obey    There were no vitals filed for this visit.  Visit Diagnosis:  Lymphedema  Stiffness of neck  Dysphagia      Subjective Assessment - 11/27/14 0805    Subjective Been using the chip pack in the stockinette for 1-2 hours/day, the bandage one doesnt seem to help as much. And doing okay with the manual lymph drainage as well. Got my insurance questions answered so I'll be coming 2x/week now. My neck just feels full today.    Currently in Pain? No/denies                         [facility] Adult PT Treatment/Exercise - 11/27/14 0001    Manual Therapy   Manual Lymphatic Drainage (MLD) In supine, short and long neck, supraclavicular  fossae, bilateral shoulders, bilateral axillae; bil anterior upper quadrants,anterior neck, submental and submandibular nodes, bil cheeks, pre- and retroauricular nodes, and suboccipital nodes all directing towards lateral neck pathways. Finished with bil shoulder collectors and bil axillae nodes reviewing with pt throughout.                    Short Term Clinic Goals - 10/30/14 1229    CC Short Term Goal  #1   Title short term goals=long term goals             Long Term Clinic Goals - 11/21/14 1103    CC Long Term Goal  #2   Status On-going   CC Long Term Goal  #3   Status On-going   CC Long Term Goal  #4   Status On-going   CC Long Term Goal  #5   Status On-going         Head and Neck Clinic Goals - 05/09/14 1520    Patient will be able to verbalize understanding of a home exercise program for cervical range of motion, posture, and walking.    Status Achieved   Patient will be able to verbalize understanding of proper sitting and standing posture.    Status Achieved   Patient  will be able to verbalize understanding of lymphedema risk and availability of treatment for this condition.    Status Achieved           Plan - 11/27/14 0849    Clinical Impression Statement Per pt reports she is doing well with self manual lymph drainage. She had only one question about correct direction of stretch which she was able to return correct demo of after I showed her. Pt is also feeling better about her lymphedema now that she is being educated about it and has a few options regarding compression for now.    Pt will benefit from skilled therapeutic intervention in order to improve on the following deficits Decreased range of motion;Increased edema;Decreased knowledge of use of DME;Decreased knowledge of precautions   Rehab Potential Excellent   Clinical Impairments Affecting Rehab Potential radiation therapy    PT Frequency 2x / week   PT Duration 4 weeks    PT Treatment/Interventions Manual lymph drainage;Compression bandaging;Patient/family education;Therapeutic exercise;ADLs/Self Care Home Management;DME Instruction   PT Next Visit Plan Review/continue manual lymph drainage; take circumference measurements, show patient samples and pictures of manufactured compression garments; progress to exercise once patient is confident about manual lymph drainage   Consulted and Agree with Plan of Care Patient        Problem List Patient Active Problem List   Diagnosis Date Noted  . Lymphedema 10/25/2014  . Constipation 09/18/2014  . Nausea without vomiting 09/18/2014  . Dehydration 09/06/2014  . Radiation-induced dermatitis 09/06/2014  . Mucositis due to radiation therapy 09/06/2014  . Yeast infection of the skin 08/15/2014  . Thyroid nodule 07/20/2014  . Neuropathy due to chemotherapeutic drug 06/21/2014  . Other constipation 06/21/2014  . GERD (gastroesophageal reflux disease) 06/08/2014  . Leukopenia due to antineoplastic chemotherapy 06/08/2014  . Mucositis due to chemotherapy 06/08/2014  . Anemia in neoplastic disease 06/01/2014  . Diarrhea 05/23/2014  . Hypertension 05/19/2014  . S/P PICC central line placement 05/16/2014  . Cancer of base of tongue 05/09/2014  . Diabetes mellitus type 2, controlled, without complications 09/62/8366    Otelia Limes, PTA 11/27/2014, 8:51 AM  [place], [place], [address] Phone: [phone]   Fax:  [phone]

## 2014-11-29 ENCOUNTER — Ambulatory Visit: Payer: BLUE CROSS/BLUE SHIELD | Admitting: Physical Therapy

## 2014-11-29 DIAGNOSIS — I890 Lymphedema, not elsewhere classified: Secondary | ICD-10-CM | POA: Diagnosis not present

## 2014-11-29 NOTE — Therapy (Signed)
[place], [place], [address] Phone: [phone]   Fax:  [phone]  Physical Therapy Treatment  Patient Details  Name: [name] MRN: [id number] Date of Birth: 1956-06-07 Referring Provider:  Eppie Gibson, MD  Encounter Date: 11/29/2014      PT End of Session - 11/29/14 1119    Visit Number 6   Number of Visits 9   Date for PT Re-Evaluation 11/30/14   PT Start Time 1018   PT Stop Time 1105   PT Time Calculation (min) 47 min      Past Medical History  Diagnosis Date  . Hypertension   . Diabetes mellitus without complication     "borderline diabetic"   . Hypercholesteremia   . Diabetes mellitus type 2, controlled, without complications 89/38/1017  . Cancer of tongue   . Anemia in neoplastic disease 06/01/2014  . GERD (gastroesophageal reflux disease) 06/08/2014  . Cancer     tongue  . Yeast infection of the skin 08/15/2014  . History of radiation therapy 08/07/14- 09/24/14    tongue, 70 Gy    Past Surgical History  Procedure Laterality Date  . Cervical ablation      Uterus  . Nasal fracture surgery  5102    Dr. Erik Obey    There were no vitals filed for this visit.  Visit Diagnosis:  Lymphedema      Subjective Assessment - 11/29/14 1113    Subjective pt has been using the chip pack 2 hours a day. She finds the comprilan bandage one to be too uncomfortable and not as effective.    Pertinent History Pt. with 6 week h/o feeling "like something was in her throat" and a lump in her neck presented 04/23/14.  Diagnosed with malignant cells consistent with squamous cell carcinoma by fine needle aspiration.  CT showed several level II and II suspicious lymph nodes.She the underwernt chemo with 3 aggressive treaments and 35 treatments of  radiation that completed September 24, 2014   Currently in Pain? No/denies               LYMPHEDEMA/ONCOLOGY QUESTIONNAIRE - 11/29/14 1059    Head and  Neck   4 cm superior to sternal notch around neck 39.8 cm   6 cm superior to sternal notch around neck 39.9 cm   8 cm superior to sternal notch around neck 40 cm                  [facility] Adult PT Treatment/Exercise - 11/29/14 0001    Manual Therapy   Manual Therapy Edema management   Manual Lymphatic Drainage (MLD) In supine, supraclavicular fossae, bilateral shoulders, bilateral axillae; lateral, anterolateral, and anterior neck; posterolateral neck; then lateral neck, supraclavidular fossae, upper chest from sternum toward shoulders, bilateral shoulders, and axilla with emphasis on left side.   Other Manual Therapy discussed compression chin straps and showed patient jovi pack sample She continues to express concern about the cost of garment and if they would be effective                   Short Term Clinic Goals - 10/30/14 1229    CC Short Term Goal  #1   Title short term goals=long term goals             Long Term Clinic Goals - 11/29/14 1122    CC Long Term Goal  #1   Title Patient with verbalize an understanding of  lymphedema risk reduction precautions   Status On-going   CC Long Term Goal  #2   Title Patient will be independent in self manual lymph drainage   Status On-going   CC Long Term Goal  #3   Title Patient will be independent in a basic home exercise program  per patient report   Status Achieved   CC Long Term Goal  #4   Title Patient will be know how to  use compression garments for maintenance phase of treatment   Status Achieved   CC Long Term Goal  #5   Title Patient will have a circumferential reduction of    1       cm at     8 cm above the    sternal notch   Status On-going         Head and Neck Clinic Goals - 05/09/14 1520    Patient will be able to verbalize understanding of a home exercise program for cervical range of motion, posture, and walking.    Status Achieved   Patient will be able to verbalize understanding of  proper sitting and standing posture.    Status Achieved   Patient will be able to verbalize understanding of lymphedema risk and availability of treatment for this condition.    Status Achieved           Plan - 11/29/14 1119    Clinical Impression Statement Pt needs continued cues and instruction in self manual lymph draiange.  Encouraged her to continue with exercise, both for neck and upper trunk range of motion for remedical exercise and also general exercise.  She continues to lose about a pound a week.  Question if some of increased circumefernce at neck is from excess skin from  48 pound weight loss vs. lymphedema    She feels that she understands the exercise she needs to do and how to use compression  so those goals have been met   PT Next Visit Plan Continue with manual lymph drainage treatment with emphasis on left neck  as pt is most concerned that edema gets reduced as much as possilbe. Tell pt that [facility] in [place] is a [employer] if she would like to contact them for pricing of garment        Problem List Patient Active Problem List   Diagnosis Date Noted  . Lymphedema 10/25/2014  . Constipation 09/18/2014  . Nausea without vomiting 09/18/2014  . Dehydration 09/06/2014  . Radiation-induced dermatitis 09/06/2014  . Mucositis due to radiation therapy 09/06/2014  . Yeast infection of the skin 08/15/2014  . Thyroid nodule 07/20/2014  . Neuropathy due to chemotherapeutic drug 06/21/2014  . Other constipation 06/21/2014  . GERD (gastroesophageal reflux disease) 06/08/2014  . Leukopenia due to antineoplastic chemotherapy 06/08/2014  . Mucositis due to chemotherapy 06/08/2014  . Anemia in neoplastic disease 06/01/2014  . Diarrhea 05/23/2014  . Hypertension 05/19/2014  . S/P PICC central line placement 05/16/2014  . Cancer of base of tongue 05/09/2014  . Diabetes mellitus type 2, controlled, without complications 59/16/3846   Donato Heinz.  Owens Shark, PT  11/29/2014, 11:27 AM  [place], [place], [address] Phone: [phone]   Fax:  [phone]

## 2014-12-04 ENCOUNTER — Ambulatory Visit: Payer: BLUE CROSS/BLUE SHIELD | Admitting: Physical Therapy

## 2014-12-04 DIAGNOSIS — I890 Lymphedema, not elsewhere classified: Secondary | ICD-10-CM | POA: Diagnosis not present

## 2014-12-04 NOTE — Therapy (Signed)
[place], [place], [address] Phone: [phone]   Fax:  [phone]  Physical Therapy Treatment  Patient Details  Name: [name] MRN: [id number] Date of Birth: January 23, 1956 Referring Provider:  Eppie Gibson, MD  Encounter Date: 12/04/2014      PT End of Session - 12/04/14 1731    Visit Number 7   Number of Visits 15   Date for PT Re-Evaluation 01/04/15   PT Start Time 1350   PT Stop Time 1431   PT Time Calculation (min) 41 min   Activity Tolerance Patient tolerated treatment well   Behavior During Therapy [facility] for tasks assessed/performed      Past Medical History  Diagnosis Date  . Hypertension   . Diabetes mellitus without complication     "borderline diabetic"   . Hypercholesteremia   . Diabetes mellitus type 2, controlled, without complications 34/74/2595  . Cancer of tongue   . Anemia in neoplastic disease 06/01/2014  . GERD (gastroesophageal reflux disease) 06/08/2014  . Cancer     tongue  . Yeast infection of the skin 08/15/2014  . History of radiation therapy 08/07/14- 09/24/14    tongue, 70 Gy    Past Surgical History  Procedure Laterality Date  . Cervical ablation      Uterus  . Nasal fracture surgery  6387    Dr. Erik Obey    There were no vitals filed for this visit.  Visit Diagnosis:  Lymphedema      Subjective Assessment - 12/04/14 1350    Subjective having a dry mouth today; brought chip pack in for adjustment today   Currently in Pain? No/denies                         [facility] Adult PT Treatment/Exercise - 12/04/14 0001    Manual Therapy   Edema Management worked to fashion a new foam chip pack that covers a larger area of neck; educated patient about having it snug but not tight.   Manual Lymphatic Drainage (MLD) In supine, supraclavicular fossae, bilateral shoulders, bilateral axillae; pectoral nodes;lateral, anterolateral, and anterior neck;  posterolateral neck; chin, cheeks, and pre-auricular nodes; then lateral neck, supraclavidular fossae, upper chest from sternum toward shoulders, bilateral shoulders, and axillae.                PT Education - 12/04/14 1730    Education provided Yes   Education Details use foam chip pack (now made larger), having it snug but not tight and never uncomfortable   Person(s) Educated Patient   Methods Explanation;Demonstration   Comprehension Verbalized understanding           Short Term Clinic Goals - 10/30/14 1229    CC Short Term Goal  #1   Title short term goals=long term goals             Long Term Clinic Goals - 12/04/14 1735    CC Long Term Goal  #1   Status Achieved   CC Long Term Goal  #2   Status On-going         Head and Neck Clinic Goals - 05/09/14 1520    Patient will be able to verbalize understanding of a home exercise program for cervical range of motion, posture, and walking.    Status Achieved   Patient will be able to verbalize understanding of proper sitting and standing posture.    Status Achieved   Patient  will be able to verbalize understanding of lymphedema risk and availability of treatment for this condition.    Status Achieved           Plan - 12/04/14 1732    Clinical Impression Statement Patient asked appropriate questions about use of foam chip pack and about how her neck looks today.  She plans to attend cancer survivor celebration this weekend; seems to be working on adjusting to life post cancer treatment.  She may want to continue to use handmade foam chip pack instead of a manufactured, more expensive garment.   Pt will benefit from skilled therapeutic intervention in order to improve on the following deficits Decreased range of motion;Increased edema;Decreased knowledge of use of DME;Decreased knowledge of precautions   Rehab Potential Excellent   PT Frequency 2x / week   PT Duration 4 weeks   PT  Treatment/Interventions Manual lymph drainage;Compression bandaging;Patient/family education;Therapeutic exercise;ADLs/Self Care Home Management;DME Instruction   PT Next Visit Plan continue manual lymph drainage and patient education   PT Home Exercise Plan manual lymph drainage once or twice a day and continued use of chip pack one or more hours a day   Consulted and Agree with Plan of Care Patient        Problem List Patient Active Problem List   Diagnosis Date Noted  . Lymphedema 10/25/2014  . Constipation 09/18/2014  . Nausea without vomiting 09/18/2014  . Dehydration 09/06/2014  . Radiation-induced dermatitis 09/06/2014  . Mucositis due to radiation therapy 09/06/2014  . Yeast infection of the skin 08/15/2014  . Thyroid nodule 07/20/2014  . Neuropathy due to chemotherapeutic drug 06/21/2014  . Other constipation 06/21/2014  . GERD (gastroesophageal reflux disease) 06/08/2014  . Leukopenia due to antineoplastic chemotherapy 06/08/2014  . Mucositis due to chemotherapy 06/08/2014  . Anemia in neoplastic disease 06/01/2014  . Diarrhea 05/23/2014  . Hypertension 05/19/2014  . S/P PICC central line placement 05/16/2014  . Cancer of base of tongue 05/09/2014  . Diabetes mellitus type 2, controlled, without complications 06/24/7251    [name] 12/04/2014, 5:36 PM  [place], [place], [address] Phone: [phone]   Fax:  [place], PT 12/04/2014 5:36 PM

## 2014-12-05 ENCOUNTER — Ambulatory Visit: Payer: BLUE CROSS/BLUE SHIELD | Admitting: Physical Therapy

## 2014-12-06 ENCOUNTER — Ambulatory Visit: Payer: BLUE CROSS/BLUE SHIELD | Admitting: Physical Therapy

## 2014-12-10 ENCOUNTER — Ambulatory Visit: Payer: BLUE CROSS/BLUE SHIELD

## 2014-12-10 DIAGNOSIS — I890 Lymphedema, not elsewhere classified: Secondary | ICD-10-CM | POA: Diagnosis not present

## 2014-12-10 DIAGNOSIS — R1310 Dysphagia, unspecified: Secondary | ICD-10-CM

## 2014-12-10 NOTE — Patient Instructions (Signed)
Please complete exercises as directed (6 of the 7 days/week, 2-3 times per day)

## 2014-12-10 NOTE — Therapy (Signed)
[facility] [address] [place], [place], [address] Phone: [phone]   Fax:  [phone]  Speech Language Pathology Treatment  Patient Details  Name: [name] MRN: [id number] Date of Birth: [birth year] Referring Provider:  Gaynelle Arabian, MD  Encounter Date: 12/10/2014      End of Session - 12/10/14 1152    Visit Number 4   Number of Visits 6   Date for SLP Re-Evaluation 01/12/15   SLP Start Time 1103   SLP Stop Time  1149   SLP Time Calculation (min) 46 min   Activity Tolerance Patient tolerated treatment well      Past Medical History  Diagnosis Date  . Hypertension   . Diabetes mellitus without complication     "borderline diabetic"   . Hypercholesteremia   . Diabetes mellitus type 2, controlled, without complications 81/15/7262  . Cancer of tongue   . Anemia in neoplastic disease 06/01/2014  . GERD (gastroesophageal reflux disease) 06/08/2014  . Cancer     tongue  . Yeast infection of the skin 08/15/2014  . History of radiation therapy 08/07/14- 09/24/14    tongue, 70 Gy    Past Surgical History  Procedure Laterality Date  . Cervical ablation      Uterus  . Nasal fracture surgery  0355    Dr. Erik Obey    There were no vitals filed for this visit.  Visit Diagnosis: Dysphagia      Subjective Assessment - 12/10/14 1110    Subjective I'm just more concerned about my voice coming and going." Pt arrives today with mod hoarseness. April 4th last rad tx day.               ADULT SLP TREATMENT - 12/10/14 1116    General Information   Behavior/Cognition Alert;Cooperative;Pleasant mood   HPI Hamburger steak, chicken (moist) have passed pharyngeally WFL, at least, according to pt.   Treatment Provided   Treatment provided Dysphagia   Dysphagia Treatment   Treatment Methods Skilled observation;Therapeutic exercise   Pharyngeal Phase Signs & Symptoms Multiple swallows;Complaints of residue    Other treatment/comments SLP palpated pt's pharyngeal area and felt possible mild fibrosis submentally - challenging to differentially diagnose with pt's lymphedema. Pt's varitey of POs has increased. Pt has maintained wight over the last 2-4 weeks. SLP provided insight to pt on foods to cont to try safely. Pt has not been completing HEP as directed, at approx x3/week, x2 day.  Pt ate cereal bar with small bites with concurrent H2O wash x2 with successful pharyngeal clearance and without overt s/s aspiration. Possible slight decr'd thyroid elevation. Pt performed HEP with min to [employer] and verbal cues. Pt has not kept food journal, but was able to tell SLP why one may be helpful, and stated she would try to keep a simple journal to assist herself in regaining full variety of PO diet.   Pain Assessment   Pain Assessment No/denies pain          SLP Education - 12/10/14 1151    Education Details HEP   Person(s) Educated Patient   Methods Explanation;Demonstration;Verbal cues   Comprehension Verbalized understanding;Returned demonstration          SLP Short Term Goals - 11/09/14 1024    SLP SHORT TERM GOAL #1   Title pt will complete HEP with min A   Period --  therapy visits   Status Achieved   SLP SHORT TERM GOAL #2  Title pt will tell SLP why she is completing HEP   Period --  therapy visits   Status Achieved          SLP Long Term Goals - 12/10/14 1154    SLP LONG TERM GOAL #1   Title pt will tell SLP why food journal can be helpful for return to full PO diet following head/neck radiation   Status Achieved   SLP LONG TERM GOAL #2   Title pt will complete HEP with modified assistance over 4 sessions in order to maximize opportunity for WNL swallowing over time   Time 3   Period --  visits   Status On-going   SLP LONG TERM GOAL #3   Title pt will tell SLP 3 s/s/ aspiration PNA to minimize pt risk for pulmonary complications following head/neck radiation   Time 3    Period --  visits   Status On-going          Plan - 12/10/14 1152    Clinical Impression Statement Pt is not completing HEP as prescribed - SLP reiterated need to do 6 of 7 days a week, 2-3 times per day. Pt's POs have incr'd in variety. She was safe with POs tested today. ST remains needed to cont to assess pt's safety with POs and with procedure with HEP.   Speech Therapy Frequency --  approx every four weeks   Duration --  2-3 more visits   Treatment/Interventions Pharyngeal strengthening exercises;Oral motor exercises;SLP instruction and feedback;Patient/family education;Trials of upgraded texture/liquids   Potential to Achieve Goals Good        Problem List Patient Active Problem List   Diagnosis Date Noted  . Lymphedema 10/25/2014  . Constipation 09/18/2014  . Nausea without vomiting 09/18/2014  . Dehydration 09/06/2014  . Radiation-induced dermatitis 09/06/2014  . Mucositis due to radiation therapy 09/06/2014  . Yeast infection of the skin 08/15/2014  . Thyroid nodule 07/20/2014  . Neuropathy due to chemotherapeutic drug 06/21/2014  . Other constipation 06/21/2014  . GERD (gastroesophageal reflux disease) 06/08/2014  . Leukopenia due to antineoplastic chemotherapy 06/08/2014  . Mucositis due to chemotherapy 06/08/2014  . Anemia in neoplastic disease 06/01/2014  . Diarrhea 05/23/2014  . Hypertension 05/19/2014  . S/P PICC central line placement 05/16/2014  . Cancer of base of tongue 05/09/2014  . Diabetes mellitus type 2, controlled, without complications 75/91/6384    [facility] , [place], Levy  12/10/2014, 11:57 AM  [facility] [address] [place], [place], [address] Phone: [phone]   Fax:  [phone]

## 2014-12-11 ENCOUNTER — Ambulatory Visit: Payer: BLUE CROSS/BLUE SHIELD | Admitting: Physical Therapy

## 2014-12-11 ENCOUNTER — Encounter (HOSPITAL_COMMUNITY): Payer: Self-pay | Admitting: Dentistry

## 2014-12-11 ENCOUNTER — Ambulatory Visit (HOSPITAL_COMMUNITY): Payer: Self-pay | Admitting: Dentistry

## 2014-12-11 ENCOUNTER — Encounter (INDEPENDENT_AMBULATORY_CARE_PROVIDER_SITE_OTHER): Payer: Self-pay

## 2014-12-11 VITALS — BP 126/54 | HR 76 | Temp 97.5°F | Wt 158.0 lb

## 2014-12-11 DIAGNOSIS — Z9221 Personal history of antineoplastic chemotherapy: Secondary | ICD-10-CM

## 2014-12-11 DIAGNOSIS — C01 Malignant neoplasm of base of tongue: Secondary | ICD-10-CM | POA: Diagnosis not present

## 2014-12-11 DIAGNOSIS — K045 Chronic apical periodontitis: Secondary | ICD-10-CM

## 2014-12-11 DIAGNOSIS — K036 Deposits [accretions] on teeth: Secondary | ICD-10-CM

## 2014-12-11 DIAGNOSIS — Z5189 Encounter for other specified aftercare: Secondary | ICD-10-CM | POA: Diagnosis not present

## 2014-12-11 DIAGNOSIS — K0530 Chronic periodontitis, unspecified: Secondary | ICD-10-CM

## 2014-12-11 DIAGNOSIS — R682 Dry mouth, unspecified: Secondary | ICD-10-CM

## 2014-12-11 DIAGNOSIS — Z923 Personal history of irradiation: Secondary | ICD-10-CM

## 2014-12-11 DIAGNOSIS — R432 Parageusia: Secondary | ICD-10-CM

## 2014-12-11 DIAGNOSIS — K117 Disturbances of salivary secretion: Secondary | ICD-10-CM

## 2014-12-11 DIAGNOSIS — Z0189 Encounter for other specified special examinations: Secondary | ICD-10-CM

## 2014-12-11 NOTE — Progress Notes (Signed)
12/11/2014  Patient Name:   [name] Date of Birth:   06-Sep-1955 Medical Record Number: [id number]  BP 126/54 mmHg  Pulse 76  Temp(Src) 97.5 F (36.4 C) (Oral)  Wt 158 lb (71.668 kg)  [name] presents for oral examination after induction chemotherapy and radiation therapy. Patient has completed 3 cycles of induction chemotherapy with Dr. Alvy Bimler. This was followed by radiation therapy from 08/07/2014 through 09/24/2014.  REVIEW OF CHIEF COMPLAINTS:  DRY MOUTH: Yes HARD TO SWALLOW: Denies  HURT TO SWALLOW: Denies TASTE CHANGES: Taste is returning SORES IN MOUTH: No sores in her mouth TRISMUS: No problems with trismus symptoms WEIGHT: 158 pounds from an original 206 pounds at start of treatment.  HOME OH REGIMEN:  BRUSHING: Twice a day FLOSSING: Daily RINSING: Using salt water and baking soda rinses and Biotene rinses as needed FLUORIDE: Using fluoride at bedtime TRISMUS EXERCISES:  Maximum interincisal opening: 50 mm   DENTAL EXAM:  Oral Hygiene:(PLAQUE): Good oral hygiene LOCATION OF MUCOSITIS: None noted DESCRIPTION OF SALIVA: Mild to moderate Xerostomia. ANY EXPOSED BONE: None noted OTHER WATCHED AREAS: Periapical radiolucency associated with tooth numbers 12, 20. Tooth #12 needs evaluation for root canal therapy. Tooth #20 needs evaluation by an endodontist for retreatment and/or periapical surgery. Dental caries #25 days evaluation for dental restoration. DX: Xerostomia, Dysgeusia and Dental caries, periapical pathology, and need for periodontal therapy  RECOMMENDATIONS: 1. Brush after meals and at bedtime.  Use fluoride at bedtime. 2. Use trismus exercises as directed. 3. Use Biotene Rinse or salt water/baking soda rinses. 4. Multiple sips of water as needed. 5. Return to Dr. Eula Listen for exam, periodontal recall, dental restorations, and evaluation for root canal therapy and possible referral to an endodontist for retreatment or apical surgery as  needed.    Lenn Cal, DDS

## 2014-12-11 NOTE — Patient Instructions (Signed)
RECOMMENDATIONS: 1. Brush after meals and at bedtime.  Use fluoride at bedtime. 2. Use trismus exercises as directed. 3. Use Biotene Rinse or salt water/baking soda rinses. 4. Multiple sips of water as needed. 5. Return to Dr. Eula Listen for exam, periodontal recall, dental restorations, and evaluation for root canal therapy and possible referral to an endodontist for retreatment or apical surgery as needed.    Lenn Cal, DDS  RADIATION THERAPY AND DECISIONS REGARDING YOUR TEETH  Xerostomia (dry mouth) Your salivary glands may be in the filed of radiation.  Radiation may include all or part of your saliva glands.  This will cause your saliva to dry up and you will have a dry mouth.  The dry mouth will be for the rest of your life unless your radiation oncologist tells you otherwise.  Your saliva has many functions:  Saliva wets your tongue for speaking.  It coats your teeth and the inside of your mouth for easier movement.  It helps with chewing and swallowing food.  It helps clean away harmful acid and toxic products made by the germs in your mouth, therefore it helps prevent cavities.  It kills some germs in your mouth and helps to prevent gum disease.  It helps to carry flavor to your taste buds.  Once you have lost your saliva you will be at higher risk for tooth decay and gum disease.  What can be done to help improve your mouth when there's not enough saliva:  1.  Your dentist may give a prescription for Salagen.  It will not bring back all of your saliva but may bring back some of it.  Also your saliva may be thick and ropy or white and foamy. It will not feel like it use to feel.  2.  You will need to swish with water every time your mouth feels dry.  YOU CANNOT suck on any cough drops, mints, lemon drops, candy, vitamin C or any other products.  You cannot use anything other than water to make your mouth feel less dry.  If you want to drink anything else you have  to drink it all at once and brush afterwards.  Be sure to discuss the details of your diet habits with your dentist or hygienist.  Radiation caries: This is decay that happens very quickly once your mouth is very dry due to radiation therapy.  Normally cavities take six months to two years to become a problem.  When you have dry mouth cavities may take as little as eight weeks to cause you a problem.  This is why dental check ups every two months are necessary as long as you have a dry mouth. Radiation caries typically, but not always, start at your gum line where it is hard to see the cavity.  It is therefore also hard to fill these cavities adequately.  This high rate of cavities happens because your mouth no longer has saliva and therefore the acid made by the germs starts the decay process.  Whenever you eat anything the germs in your mouth change the food into acid.  The acid then burns a small hole in your tooth.  This small hole is the beginning of a cavity.  If this is not treated then it will grow bigger and become a cavity.  The way to avoid this hole getting bigger is to use fluoride every evening as prescribed by your dentist.  You have to make sure that your teeth are  very clean before you use the fluoride.  This fluoride in turn will strengthen your teeth and prepare them for another day of fighting acid.  If you develop radiation caries many times the damage is so large that you will have to have all your teeth removed.  This could be a big problem if some of these teeth are in the field of radiation.  Further details of why this could be a big problem will follow.  (See Osteoradionecrosis).  Loss of taste (dysgeusia) This happens to varying degrees once you've had radiation therapy to your jaw region.  Many times taste is not completely lost but becomes limited.  The loss of taste is mostly due to radiation affecting your taste buds.  However if you have no saliva in your mouth to carry the  flavor to your taste buds it would be difficult for your taste buds to taste anything.  That is why using water or a prescription for Salagen prior to meals and during meals may help with some of the taste.  Keep in mind that taste generally returns very slowly over the course of several months or several years after radiation therapy.  Don't give up hope.  Trismus According to your Radiation Oncologist your TMJ or jaw joints are going to be partially or fully in the field of radiation.  This means that over time the muscles that help you open and close your mouth may get stiff.  This will potentially result in your not being able to open your mouth wide enough or as wide as you can open it now.  Le me give you an example of how slowly this happens and how unaware people are of it.  A gentlemen that had radiation therapy two years ago came back to me complaining that bananas are just too large for him to be able to fit them in between his teeth.  He was not able to open wide enough to bite into a banana.  This happens slowly and over a period of time.  What do we do to try and prevent this?  Your dentist will probably give you a stack of sticks called a trismus exercise device .  This stack will help your remind your muscles and your jaw joint to open up to the same distance every day.  Use these sticks every morning when you wake up according to the instructions given by the dentist.   You must use these sticks for at least one to two years after radiation therapy.  The reason for that is because it happens so slowly and keeps going on for about two years after radiation therapy.  Your hospital dentist will help you monitor your mouth opening and make sure that it's not getting smaller.  Osteoradionecrosis (ORN) This is a condition where your jaw bone after having had radiation therapy becomes very dry.  It has very little blood supply to keep it alive.  If you develop a cavity that turns into an abscess or  an infection then the jaw bone does not have enough blood supply to help fight the infection.  At this point it is very likely that the infection could cause the death of your jaw bone.  When you have dead bone it has to be removed.  Therefore you might end up having to have surgery to remove part of your jaw bone, the part of the jaw bone that has been affected.   Healing is also a problem  if you are to have surgery in the areas where the bone has had radiation therapy.  The same reasons apply.  If you have surgery you need more blood supply which is not available.  When blood supply and oxygen are not available again, there is a chance for the bone to die.  Occasionally ORN happens on its own with no obvious reason.  This is quite rare.  We believe that patients who continue to smoke and/or drink alcohol have a higher chance of having this bone problem.  Therefore once your jaw bone has had radiation therapy if there are any teeth in that area, you should never have them pulled.  You should also never have any surgery on your teeth or gums in that area unless the oral surgeon or Periodontist is aware of your history of radiation. There is some expensive management techniques that might be used to limit your risks.  The risks for ORN either from infection or spontaneous ( or on it's own) are life long.    FLUORIDE TRAYS PATIENT INSTRUCTIONS    Obtain prescription from the pharmacy.  Don't be surprised if it needs to be ordered.  Be sure to let the pharmacy know when you are close to needing a new refill for them to have it ready for you without interruption of Fluoride use.  The best time to use your Fluoride is before bed time.  You must brush your teeth very well and floss before using the Fluoride in order to get the best use out of the Fluoride treatments.  Place 1 drop of Fluoride gel per tooth in the tray.  Place the tray on your lower teeth and your upper teeth.  Make sure the trays are  seated all the way.  Remember, they only fit one way on your teeth.  Insert for 5 full minutes.  At the end of the 5 minutes, take the trays out.  SPIT OUT excess. .  Do NOT rinse your mouth!  Do NOT eat or drink after treatments for at least 30 minutes.  This is why the best time for your treatments is before bedtime.  Clean the inside of your Fluoride trays using COLD WATER and a toothbrush.  In order to keep your Trays from discoloring and free from odors, soak them overnight in denture cleaners such as Efferdent.  Do not use bleach or non denture products.  Store the trays in a safe dry place AWAY from any heat until your next treatment.  If anything happens to your Fluoride trays, or they don't fit as well after any dental work, please let us know as soon as possible.  TRISMUS  Trismus is a condition where the jaw does not allow the mouth to open as wide as it usually does.  This can happen almost suddenly, or in other cases the process is so slow, it is hard to notice it-until it is too far along.  When the jaw joints and/or muscles have been exposed to radiation treatments, the onset of Trismus is very slow.  This is because the muscles are losing their stretching ability over a long period of time, as long as 2 YEARS after the end of radiation.  It is therefore important to exercise these muscles and joints.  TRISMUS EXERCISES   Stack of tongue depressors measuring the same or a little less than the last documented MIO (Maximum Interincisal Opening).  Secure them with a rubber band on both ends.  Place  the stack in the patient's mouth, supporting the other end.  Allow 30 seconds for muscle stretching.  Rest for a few seconds.  Repeat 3-5 times  For all radiation patients, this exercise is recommended in the mornings and evenings unless otherwise instructed.  The exercise should be done for a period of 2 YEARS after the end of radiation.  MIO should be checked routinely  on recall dental visits by the general dentist or the hospital dentist.  The patient is advised to report any changes, soreness, or difficulties encountered when doing the exercises.

## 2014-12-14 ENCOUNTER — Ambulatory Visit: Payer: BLUE CROSS/BLUE SHIELD | Admitting: Physical Therapy

## 2014-12-17 ENCOUNTER — Ambulatory Visit: Payer: BLUE CROSS/BLUE SHIELD | Admitting: Physical Therapy

## 2014-12-17 DIAGNOSIS — I890 Lymphedema, not elsewhere classified: Secondary | ICD-10-CM

## 2014-12-17 NOTE — Therapy (Signed)
[place], [place], [address] Phone: [phone]   Fax:  [phone]  Physical Therapy Treatment  Patient Details  Name: [name] MRN: [id number] Date of Birth: 09-14-1955 Referring Provider:  Eppie Gibson, MD  Encounter Date: 12/17/2014      PT End of Session - 12/17/14 1157    Visit Number 8   Number of Visits 15   Date for PT Re-Evaluation 01/04/15   PT Start Time 1108   PT Stop Time 1155   PT Time Calculation (min) 47 min   Activity Tolerance Patient tolerated treatment well   Behavior During Therapy [facility] for tasks assessed/performed      Past Medical History  Diagnosis Date  . Hypertension   . Diabetes mellitus without complication     "borderline diabetic"   . Hypercholesteremia   . Diabetes mellitus type 2, controlled, without complications 61/60/7371  . Cancer of tongue   . Anemia in neoplastic disease 06/01/2014  . GERD (gastroesophageal reflux disease) 06/08/2014  . Cancer     tongue  . Yeast infection of the skin 08/15/2014  . History of radiation therapy 08/07/14- 09/24/14    tongue, 70 Gy    Past Surgical History  Procedure Laterality Date  . Cervical ablation      Uterus  . Nasal fracture surgery  0626    Dr. Erik Obey    There were no vitals filed for this visit.  Visit Diagnosis:  Lymphedema      Subjective Assessment - 12/17/14 1109    Subjective Got a chance to go to the beach for along weekend so couldn't pass it up.  Tried different foods; was able to each some good fish.  Used sunscreen and a bandana and didn't have any problems with sun.  Wore chip pack  while there.   Currently in Pain? No/denies               LYMPHEDEMA/ONCOLOGY QUESTIONNAIRE - 12/17/14 1149    Head and Neck   4 cm superior to sternal notch around neck 40.1 cm   6 cm superior to sternal notch around neck 40.1 cm   8 cm superior to sternal notch around neck 41.1 cm  after MLD,  but patient has been away for a week                  [facility] Adult PT Treatment/Exercise - 12/17/14 0001    Manual Therapy   Manual Lymphatic Drainage (MLD) In supine, supraclavicular fossae, bilateral shoulders, bilateral axillae; pectoral nodes;lateral, anterolateral, and anterior neck; posterolateral neck; chin, cheeks, and pre-auricular nodes; then lateral neck, supraclavidular fossae, upper chest from sternum toward shoulders, bilateral shoulders, and axillae.                   Short Term Clinic Goals - 10/30/14 1229    CC Short Term Goal  #1   Title short term goals=long term goals             Long Term Clinic Goals - 12/04/14 1735    CC Long Term Goal  #1   Status Achieved   CC Long Term Goal  #2   Status On-going         Head and Neck Clinic Goals - 05/09/14 1520    Patient will be able to verbalize understanding of a home exercise program for cervical range of motion, posture, and walking.    Status Achieved   Patient  will be able to verbalize understanding of proper sitting and standing posture.    Status Achieved   Patient will be able to verbalize understanding of lymphedema risk and availability of treatment for this condition.    Status Achieved           Plan - 12/17/14 1157    Clinical Impression Statement Patient has been away for a week; she does show some increased edema by neck circumference measurements, despite doing some manual lymph drainage and using her chip pack while away.   Pt will benefit from skilled therapeutic intervention in order to improve on the following deficits Decreased range of motion;Increased edema;Decreased knowledge of use of DME;Decreased knowledge of precautions   Rehab Potential Excellent   PT Frequency 2x / week   PT Duration 4 weeks   PT Treatment/Interventions Manual lymph drainage   PT Next Visit Plan continue manual lymph drainage and patient education   Consulted and Agree  with Plan of Care Patient        Problem List Patient Active Problem List   Diagnosis Date Noted  . Lymphedema 10/25/2014  . Constipation 09/18/2014  . Nausea without vomiting 09/18/2014  . Dehydration 09/06/2014  . Radiation-induced dermatitis 09/06/2014  . Mucositis due to radiation therapy 09/06/2014  . Yeast infection of the skin 08/15/2014  . Thyroid nodule 07/20/2014  . Neuropathy due to chemotherapeutic drug 06/21/2014  . Other constipation 06/21/2014  . GERD (gastroesophageal reflux disease) 06/08/2014  . Leukopenia due to antineoplastic chemotherapy 06/08/2014  . Mucositis due to chemotherapy 06/08/2014  . Anemia in neoplastic disease 06/01/2014  . Diarrhea 05/23/2014  . Hypertension 05/19/2014  . S/P PICC central line placement 05/16/2014  . Cancer of base of tongue 05/09/2014  . Diabetes mellitus type 2, controlled, without complications 16/03/9603    Ardeth Repetto 12/17/2014, 11:59 AM  [place], [place], [address] Phone: [phone]   Fax:  [place], PT 12/17/2014 11:59 AM

## 2014-12-19 ENCOUNTER — Ambulatory Visit: Payer: BLUE CROSS/BLUE SHIELD | Admitting: Physical Therapy

## 2014-12-26 ENCOUNTER — Ambulatory Visit: Payer: BLUE CROSS/BLUE SHIELD | Attending: Radiation Oncology | Admitting: Physical Therapy

## 2014-12-26 DIAGNOSIS — M436 Torticollis: Secondary | ICD-10-CM | POA: Diagnosis present

## 2014-12-26 DIAGNOSIS — R1310 Dysphagia, unspecified: Secondary | ICD-10-CM | POA: Diagnosis present

## 2014-12-26 DIAGNOSIS — I890 Lymphedema, not elsewhere classified: Secondary | ICD-10-CM | POA: Insufficient documentation

## 2014-12-26 NOTE — Therapy (Signed)
[place], [place], [address] Phone: [phone]   Fax:  [phone]  Physical Therapy Treatment  Patient Details  Name: [name] MRN: [id number] Date of Birth: 07-17-1955 Referring Provider:  Eppie Gibson, MD  Encounter Date: 12/26/2014      PT End of Session - 12/26/14 0928    Visit Number 9   Number of Visits 15   Date for PT Re-Evaluation 01/04/15   PT Start Time 6962   PT Stop Time 0929   PT Time Calculation (min) 42 min      Past Medical History  Diagnosis Date  . Hypertension   . Diabetes mellitus without complication     "borderline diabetic"   . Hypercholesteremia   . Diabetes mellitus type 2, controlled, without complications 95/28/4132  . Cancer of tongue   . Anemia in neoplastic disease 06/01/2014  . GERD (gastroesophageal reflux disease) 06/08/2014  . Cancer     tongue  . Yeast infection of the skin 08/15/2014  . History of radiation therapy 08/07/14- 09/24/14    tongue, 70 Gy    Past Surgical History  Procedure Laterality Date  . Cervical ablation      Uterus  . Nasal fracture surgery  4401    Dr. Erik Obey    There were no vitals filed for this visit.  Visit Diagnosis:  Lymphedema  Stiffness of neck      Subjective Assessment - 12/26/14 0928    Subjective I  thought my lymphedema was doing pretty good but I say in some pictures that is was really noticeable. So i wore my chip pack for 3 hours and it looked a little better this morning. Pt is now interested in seeing if insurance will cover Jovi Pack neck piece.   Pertinent History Pt. with 6 week h/o feeling "like something was in her throat" and a lump in her neck presented 04/23/14.  Diagnosed with malignant cells consistent with squamous cell carcinoma by fine needle aspiration.  CT showed several level II and II suspicious lymph nodes.She the underwernt chemo with 3 aggressive treaments and 35 treatments of   radiation that completed September 24, 2014   Patient Stated Goals wants to do everything she can to make her feels better    Currently in Pain? No/denies                         [facility] Adult PT Treatment/Exercise - 12/26/14 0001    Manual Therapy   Manual Lymphatic Drainage (MLD) In supine, supraclavicular fossae, bilateral shoulders, bilateral axillae; pectoral nodes;lateral, anterolateral, and anterior neck; posterolateral neck; chin, cheeks, and pre-auricular nodes; then lateral neck, supraclavidular fossae, upper chest from sternum toward shoulders, bilateral shoulders, and axillae.                   Short Term Clinic Goals - 10/30/14 1229    CC Short Term Goal  #1   Title short term goals=long term goals             Long Term Clinic Goals - 12/04/14 1735    CC Long Term Goal  #1   Status Achieved   CC Long Term Goal  #2   Status On-going         Head and Neck Clinic Goals - 05/09/14 1520    Patient will be able to verbalize understanding of a home exercise program for cervical range of motion, posture,  and walking.    Status Achieved   Patient will be able to verbalize understanding of proper sitting and standing posture.    Status Achieved   Patient will be able to verbalize understanding of lymphedema risk and availability of treatment for this condition.    Status Achieved           Plan - 12/26/14 0929    Clinical Impression Statement pt continues to have lymphedema in neck and at sternal notch area that seem softer after treatment  she is doing some  treatment at home, but needs continued reinforcement and emphasis on exercise anduse of compression.  she would like to see if she has insurance coverage for Jovipakpt would like to continue PT once per week for 4 weeks to make sure she is getting maximal improvment andhas compression garment she needs   PT Next Visit Plan continue manual lymph drainage and patient education  consider kinesiotape to sternal area and lower neck trial  Gcode, recertify for 4 more weeks        Problem List Patient Active Problem List   Diagnosis Date Noted  . Lymphedema 10/25/2014  . Constipation 09/18/2014  . Nausea without vomiting 09/18/2014  . Dehydration 09/06/2014  . Radiation-induced dermatitis 09/06/2014  . Mucositis due to radiation therapy 09/06/2014  . Yeast infection of the skin 08/15/2014  . Thyroid nodule 07/20/2014  . Neuropathy due to chemotherapeutic drug 06/21/2014  . Other constipation 06/21/2014  . GERD (gastroesophageal reflux disease) 06/08/2014  . Leukopenia due to antineoplastic chemotherapy 06/08/2014  . Mucositis due to chemotherapy 06/08/2014  . Anemia in neoplastic disease 06/01/2014  . Diarrhea 05/23/2014  . Hypertension 05/19/2014  . S/P PICC central line placement 05/16/2014  . Cancer of base of tongue 05/09/2014  . Diabetes mellitus type 2, controlled, without complications 73/53/2992   Donato Heinz. Owens Shark, PT   12/26/2014, 9:33 AM  [place], [place], [address] Phone: [phone]   Fax:  [phone]

## 2014-12-31 ENCOUNTER — Telehealth: Payer: Self-pay | Admitting: Physical Therapy

## 2014-12-31 ENCOUNTER — Ambulatory Visit: Payer: BLUE CROSS/BLUE SHIELD | Admitting: Physical Therapy

## 2014-12-31 ENCOUNTER — Ambulatory Visit: Payer: BLUE CROSS/BLUE SHIELD

## 2014-12-31 DIAGNOSIS — I890 Lymphedema, not elsewhere classified: Secondary | ICD-10-CM | POA: Diagnosis not present

## 2014-12-31 DIAGNOSIS — M436 Torticollis: Secondary | ICD-10-CM

## 2014-12-31 NOTE — Therapy (Signed)
[place], [place], [address] Phone: [phone]   Fax:  [phone]  Physical Therapy Treatment  Patient Details  Name: [name] MRN: [id number] Date of Birth: 11-10-1955 Referring Provider:  Eppie Gibson, MD  Encounter Date: 12/31/2014      PT End of Session - 12/31/14 1705    Visit Number 10   Number of Visits 19   Date for PT Re-Evaluation 01/31/15   PT Start Time 1600   PT Stop Time 4235   PT Time Calculation (min) 45 min      Past Medical History  Diagnosis Date  . Hypertension   . Diabetes mellitus without complication     "borderline diabetic"   . Hypercholesteremia   . Diabetes mellitus type 2, controlled, without complications 36/14/4315  . Cancer of tongue   . Anemia in neoplastic disease 06/01/2014  . GERD (gastroesophageal reflux disease) 06/08/2014  . Cancer     tongue  . Yeast infection of the skin 08/15/2014  . History of radiation therapy 08/07/14- 09/24/14    tongue, 70 Gy    Past Surgical History  Procedure Laterality Date  . Cervical ablation      Uterus  . Nasal fracture surgery  4008    Dr. Erik Obey    There were no vitals filed for this visit.  Visit Diagnosis:  Lymphedema - Plan: PT plan of care cert/re-cert  Stiffness of neck - Plan: PT plan of care cert/re-cert                       [facility] Adult PT Treatment/Exercise - 12/31/14 0001    Neck Exercises: Seated   Other Seated Exercise lymphatic flow series   Other Seated Exercise neck extension with jaw protraction    Manual Therapy   Manual Lymphatic Drainage (MLD) In supine, supraclavicular fossae, bilateral shoulders, bilateral axillae; pectoral nodes;lateral, anterolateral, and anterior neck; posterolateral neck; chin, cheeks, and pre-auricular nodes; then lateral neck, supraclavidular fossae, upper chest from sternum toward shoulders, bilateral shoulders, and axillae.                    Short Term Clinic Goals - 10/30/14 1229    CC Short Term Goal  #1   Title short term goals=long term goals             Long Term Clinic Goals - 12/31/14 1701    CC Long Term Goal  #1   Title Patient with verbalize an understanding of lymphedema risk reduction precautions   Status Achieved   CC Long Term Goal  #2   Title Patient will be independent in self manual lymph drainage   Time 4   Period Weeks   Status On-going   CC Long Term Goal  #3   Title Patient will be independent in a basic home exercise program  per patient report   Status Achieved   CC Long Term Goal  #4   Title Patient will be know how to  use compression garments for maintenance phase of treatment   Time 4   Period Weeks   Status On-going   CC Long Term Goal  #5   Title Patient will have a circumferential reduction of    1       cm at     8 cm above the    sternal notch   Time 4   Status On-going  Head and Neck Clinic Goals - 05/09/14 1520    Patient will be able to verbalize understanding of a home exercise program for cervical range of motion, posture, and walking.    Status Achieved   Patient will be able to verbalize understanding of proper sitting and standing posture.    Status Achieved   Patient will be able to verbalize understanding of lymphedema risk and availability of treatment for this condition.    Status Achieved           Plan - 12/31/14 1655    Clinical Impression Statement pt reports she is doing her massage and exercise at home and is wearing her ace wrap bandage compression chip pack at home. contacted [employer] and she will have JoviPak fitted on Wednesday  Patient may benefit from trial of kinesitape.  She wants to continue therapy for 4 more weeks Recertification sent   Pt will benefit from skilled therapeutic intervention in order to improve on the following deficits Decreased range of motion;Increased edema;Decreased  knowledge of use of DME;Decreased knowledge of precautions   Clinical Impairments Affecting Rehab Potential radiation therapy    PT Frequency 2x / week   PT Duration 4 weeks   PT Treatment/Interventions Manual lymph drainage;Taping   PT Next Visit Plan continue manual lymph drainage and patient education consider kinesiotape to sternal area and lower neck trial          Problem List Patient Active Problem List   Diagnosis Date Noted  . Lymphedema 10/25/2014  . Constipation 09/18/2014  . Nausea without vomiting 09/18/2014  . Dehydration 09/06/2014  . Radiation-induced dermatitis 09/06/2014  . Mucositis due to radiation therapy 09/06/2014  . Yeast infection of the skin 08/15/2014  . Thyroid nodule 07/20/2014  . Neuropathy due to chemotherapeutic drug 06/21/2014  . Other constipation 06/21/2014  . GERD (gastroesophageal reflux disease) 06/08/2014  . Leukopenia due to antineoplastic chemotherapy 06/08/2014  . Mucositis due to chemotherapy 06/08/2014  . Anemia in neoplastic disease 06/01/2014  . Diarrhea 05/23/2014  . Hypertension 05/19/2014  . S/P PICC central line placement 05/16/2014  . Cancer of base of tongue 05/09/2014  . Diabetes mellitus type 2, controlled, without complications 25/49/8264   Donato Heinz. Owens Shark PT   Norwood Levo 12/31/2014, 5:06 PM  [place], [place], [address] Phone: [phone]   Fax:  [phone]

## 2014-12-31 NOTE — Telephone Encounter (Signed)
Pt missed appt today and wants to be put on waiting list

## 2015-01-07 ENCOUNTER — Telehealth: Payer: Self-pay | Admitting: *Deleted

## 2015-01-07 NOTE — Telephone Encounter (Signed)
  Oncology Nurse Navigator Documentation   Navigator Encounter Type: Telephone;6 month (01/07/15 1355)    Called patient in response to her VM last Friday evening, to conduct 6-m post-tmt follow-up check on her well-being.   She informed: 1.  PCP identified underactive thyroid, started her on 50 mg synthroid on Saturday.  She has a follow-up appt in 8 wks to check on TSH. 2.  Has maintained treatment weight loss at favorable level.  Continues not to need diabetes medication, only taking 1 (of 2) BP meds.  PCP expects that she may be able to stop this as well.  In response to inquiry, she reported: 1. Pain:  Denied. 2. Nutrition:  Eating better, including some dairy, meat (chewing very well).  Except for sense of sweet, taste buds back to normal. 3. Hydration: Drinking "lots of water", juices. 4. BMs: Every other day (her norm), taking OTC stool softener daily. 5. Mouth:  Using biotene daily for dry mouth, sipping water continuously.  Denies mouth sores 6. Skin:  Applying coconut oil daily to neck area. 7. Energy Level: At about 40% baseline.  Riding stationary bike in home daily, walking every evening weather permitting.  She thinks energy will improve now that she's taking thyroid supplement. 8. Swallowing Exercises:  Conducting BID.  She sees Artist.    I discussed upcoming H&N FYNN series starting in September, encouraged her to attend.  She expressed interest. She denied any needs or concerns; I encouraged her to contact me if that changes before I see her next, she verbalized understanding.    [name], RN, BSN, [place] [facility] [place] [phone]

## 2015-01-08 ENCOUNTER — Ambulatory Visit: Payer: BLUE CROSS/BLUE SHIELD

## 2015-01-08 DIAGNOSIS — R1310 Dysphagia, unspecified: Secondary | ICD-10-CM

## 2015-01-08 DIAGNOSIS — I890 Lymphedema, not elsewhere classified: Secondary | ICD-10-CM | POA: Diagnosis not present

## 2015-01-08 NOTE — Patient Instructions (Signed)
Cont exercises and your food journal

## 2015-01-08 NOTE — Therapy (Signed)
[place] [address] [place], [place], [address] Phone: [phone]   Fax:  [phone]  Speech Language Pathology Treatment  Patient Details  Name: [name] MRN: [id number] Date of Birth: [birth year] Referring Provider:  Gaynelle Arabian, MD  Encounter Date: 01/08/2015      End of Session - 01/08/15 0934    Visit Number 5   Number of Visits 6   Date for SLP Re-Evaluation 01/12/15   SLP Start Time 0848   SLP Stop Time  0924   SLP Time Calculation (min) 36 min   Activity Tolerance Patient tolerated treatment well      Past Medical History  Diagnosis Date  . Hypertension   . Diabetes mellitus without complication     "borderline diabetic"   . Hypercholesteremia   . Diabetes mellitus type 2, controlled, without complications 63/87/5643  . Cancer of tongue   . Anemia in neoplastic disease 06/01/2014  . GERD (gastroesophageal reflux disease) 06/08/2014  . Cancer     tongue  . Yeast infection of the skin 08/15/2014  . History of radiation therapy 08/07/14- 09/24/14    tongue, 70 Gy    Past Surgical History  Procedure Laterality Date  . Cervical ablation      Uterus  . Nasal fracture surgery  3295    Dr. Erik Obey    There were no vitals filed for this visit.  Visit Diagnosis: Dysphagia      Subjective Assessment - 01/08/15 0852    Subjective "I started writing things down like you said (about eating)."               ADULT SLP TREATMENT - 01/08/15 0853    General Information   Behavior/Cognition Alert;Cooperative;Pleasant mood   Treatment Provided   Treatment provided Dysphagia   Dysphagia Treatment   Treatment Methods Skilled observation;Therapeutic exercise   Patient observed directly with PO's Yes   Type of PO's observed Thin liquids   Oral Phase Signs & Symptoms --  none noted   Other treatment/comments SLP and pt discussed foods pt has kept a journal for; pt seeing beneifit from  keeping journal. HEP completed and SLP used skilled observation to note that procedure looked good, SBA req'd. Additionally she drank H2O without overt s/s aspiration.SLP educated pt re: increasing hold times and/or reps to challenge pt.   Pain Assessment   Pain Assessment No/denies pain   Dysphagia Recommendations   Diet recommendations Thin liquid  as tolerated solids   Progression Toward Goals   Progression toward goals Progressing toward goals          SLP Education - 01/08/15 0933    Education provided Yes   Education Details Sauces, gravies to assist in pharyngeal clearance, increasing reps/hold times   Person(s) Educated Patient   Methods Explanation;Demonstration   Comprehension Verbalized understanding;Returned demonstration          SLP Short Term Goals - 11/09/14 1024    SLP SHORT TERM GOAL #1   Title pt will complete HEP with min A   Period --  therapy visits   Status Achieved   SLP SHORT TERM GOAL #2   Title pt will tell SLP why she is completing HEP   Period --  therapy visits   Status Achieved          SLP Long Term Goals - 01/08/15 0935    SLP LONG TERM GOAL #1   Title pt will tell SLP why food journal  can be helpful for return to full PO diet following head/neck radiation   Status Achieved   SLP LONG TERM GOAL #2   Title pt will complete HEP with modified assistance over 4 sessions in order to maximize opportunity for WNL swallowing over time   Time 4   Period --  visits   Status On-going  renewed 01-08-15   SLP LONG TERM GOAL #3   Title pt will tell SLP 3 s/s/ aspiration PNA to minimize pt risk for pulmonary complications following head/neck radiation   Time 4   Period --  visits   Status On-going          Plan - 01/08/15 0934    Clinical Impression Statement Pt's frequency of HEP has improved, she did not need handout to compete exercises. Pt appears safe with current POS. Skilled ST remains needed to assess safety with POs and success  with HEP.   Speech Therapy Frequency --  aprox every 4 weeks   Duration --  x4 visits   Treatment/Interventions Pharyngeal strengthening exercises;Oral motor exercises;SLP instruction and feedback;Patient/family education;Trials of upgraded texture/liquids   Potential to Achieve Goals Good        Problem List Patient Active Problem List   Diagnosis Date Noted  . Lymphedema 10/25/2014  . Constipation 09/18/2014  . Nausea without vomiting 09/18/2014  . Dehydration 09/06/2014  . Radiation-induced dermatitis 09/06/2014  . Mucositis due to radiation therapy 09/06/2014  . Yeast infection of the skin 08/15/2014  . Thyroid nodule 07/20/2014  . Neuropathy due to chemotherapeutic drug 06/21/2014  . Other constipation 06/21/2014  . GERD (gastroesophageal reflux disease) 06/08/2014  . Leukopenia due to antineoplastic chemotherapy 06/08/2014  . Mucositis due to chemotherapy 06/08/2014  . Anemia in neoplastic disease 06/01/2014  . Diarrhea 05/23/2014  . Hypertension 05/19/2014  . S/P PICC central line placement 05/16/2014  . Cancer of base of tongue 05/09/2014  . Diabetes mellitus type 2, controlled, without complications 93/90/3009    [facility] [place] , [place], [place]  01/08/2015, 9:47 AM  [facility] [address] [place], [place], [address] Phone: [phone]   Fax:  [phone]

## 2015-01-09 ENCOUNTER — Ambulatory Visit: Payer: BLUE CROSS/BLUE SHIELD

## 2015-01-09 DIAGNOSIS — R1310 Dysphagia, unspecified: Secondary | ICD-10-CM

## 2015-01-09 DIAGNOSIS — I890 Lymphedema, not elsewhere classified: Secondary | ICD-10-CM

## 2015-01-09 DIAGNOSIS — M436 Torticollis: Secondary | ICD-10-CM

## 2015-01-09 NOTE — Therapy (Signed)
[place], [place], [address] Phone: [phone]   Fax:  [phone]  Physical Therapy Treatment  Patient Details  Name: [name] MRN: [id number] Date of Birth: 1955-11-12 Referring Provider:  Eppie Gibson, MD  Encounter Date: 01/09/2015      PT End of Session - 01/09/15 0934    Visit Number 11   Number of Visits 19   Date for PT Re-Evaluation 01/31/15   PT Start Time 0845   PT Stop Time 0931   PT Time Calculation (min) 46 min   Activity Tolerance Patient tolerated treatment well   Behavior During Therapy [facility] for tasks assessed/performed      Past Medical History  Diagnosis Date  . Hypertension   . Diabetes mellitus without complication     "borderline diabetic"   . Hypercholesteremia   . Diabetes mellitus type 2, controlled, without complications 86/76/1950  . Cancer of tongue   . Anemia in neoplastic disease 06/01/2014  . GERD (gastroesophageal reflux disease) 06/08/2014  . Cancer     tongue  . Yeast infection of the skin 08/15/2014  . History of radiation therapy 08/07/14- 09/24/14    tongue, 70 Gy    Past Surgical History  Procedure Laterality Date  . Cervical ablation      Uterus  . Nasal fracture surgery  9326    Dr. Erik Obey    There were no vitals filed for this visit.  Visit Diagnosis:  Dysphagia  Lymphedema  Stiffness of neck      Subjective Assessment - 01/09/15 0848    Subjective Started on a thyroid medicine, Dr. Marisue Humble said that radiation messed up my thyroid. Not sure how long I'll have to take that. Got an email about the jovi pack, excited to get that process started so I can get that.    Currently in Pain? No/denies                         [facility] Adult PT Treatment/Exercise - 01/09/15 0001    Manual Therapy   Manual Lymphatic Drainage (MLD) In supine, supraclavicular fossae, bilateral shoulders, bilateral axillae; pectoral nodes;lateral,  anterolateral, and anterior neck; posterolateral neck; chin, cheeks, and pre-auricular nodes; then lateral neck, supraclavidular fossae, upper chest from sternum toward shoulders, bilateral shoulders, and axillae.                   Short Term Clinic Goals - 10/30/14 1229    CC Short Term Goal  #1   Title short term goals=long term goals             Long Term Clinic Goals - 12/31/14 1701    CC Long Term Goal  #1   Title Patient with verbalize an understanding of lymphedema risk reduction precautions   Status Achieved   CC Long Term Goal  #2   Title Patient will be independent in self manual lymph drainage   Time 4   Period Weeks   Status On-going   CC Long Term Goal  #3   Title Patient will be independent in a basic home exercise program  per patient report   Status Achieved   CC Long Term Goal  #4   Title Patient will be know how to  use compression garments for maintenance phase of treatment   Time 4   Period Weeks   Status On-going   CC Long Term Goal  #5   Title  Patient will have a circumferential reduction of    1       cm at     8 cm above the    sternal notch   Time 4   Status On-going         Head and Neck Clinic Goals - 05/09/14 1520    Patient will be able to verbalize understanding of a home exercise program for cervical range of motion, posture, and walking.    Status Achieved   Patient will be able to verbalize understanding of proper sitting and standing posture.    Status Achieved   Patient will be able to verbalize understanding of lymphedema risk and availability of treatment for this condition.    Status Achieved           Plan - 01/09/15 0934    Clinical Impression Statement Patient has been doing her massage at home consistently though she doesnt feel she does it quite as effectively as we do. She is looking forward to geting a Jovi pack and she feels the chip pack has done well to reduce her fluid after wear.     Pt will benefit from skilled therapeutic intervention in order to improve on the following deficits Decreased range of motion;Increased edema;Decreased knowledge of use of DME;Decreased knowledge of precautions   Rehab Potential Excellent   Clinical Impairments Affecting Rehab Potential radiation therapy    PT Frequency 2x / week   PT Duration 4 weeks   PT Treatment/Interventions Manual lymph drainage;Taping   PT Next Visit Plan Assess next visit; continue manual lymph drainage and patient education consider kinesiotape to sternal area and lower neck trial     Consulted and Agree with Plan of Care Patient        Problem List Patient Active Problem List   Diagnosis Date Noted  . Lymphedema 10/25/2014  . Constipation 09/18/2014  . Nausea without vomiting 09/18/2014  . Dehydration 09/06/2014  . Radiation-induced dermatitis 09/06/2014  . Mucositis due to radiation therapy 09/06/2014  . Yeast infection of the skin 08/15/2014  . Thyroid nodule 07/20/2014  . Neuropathy due to chemotherapeutic drug 06/21/2014  . Other constipation 06/21/2014  . GERD (gastroesophageal reflux disease) 06/08/2014  . Leukopenia due to antineoplastic chemotherapy 06/08/2014  . Mucositis due to chemotherapy 06/08/2014  . Anemia in neoplastic disease 06/01/2014  . Diarrhea 05/23/2014  . Hypertension 05/19/2014  . S/P PICC central line placement 05/16/2014  . Cancer of base of tongue 05/09/2014  . Diabetes mellitus type 2, controlled, without complications 63/81/7711    Otelia Limes, PTA 01/09/2015, 9:37 AM  [place], [place], [address] Phone: [phone]   Fax:  [phone]

## 2015-01-16 ENCOUNTER — Ambulatory Visit: Payer: BLUE CROSS/BLUE SHIELD

## 2015-01-16 DIAGNOSIS — I890 Lymphedema, not elsewhere classified: Secondary | ICD-10-CM

## 2015-01-16 DIAGNOSIS — M436 Torticollis: Secondary | ICD-10-CM

## 2015-01-16 NOTE — Patient Instructions (Signed)
On clean skin, apply skincote. Next pull white paper off "bucket" and apply right below the collar bone line over breast bone; rub vigorously. Pull white paper off one "finger" and with a gentle stretch run it up one side of throat, then same for next; rub vigorously.   If any itchiness or discomfort occurs please gently remove; otherwise can leave on ~3 days if doesn't start to peel.

## 2015-01-16 NOTE — Therapy (Signed)
[place], [place], [address] Phone: [phone]   Fax:  [phone]  Physical Therapy Treatment  Patient Details  Name: [name] MRN: [id number] Date of Birth: [birth year] Referring Provider:  Eppie Gibson, MD  Encounter Date: 01/16/2015      PT End of Session - 01/16/15 0815    Visit Number 12   Number of Visits 19   Date for PT Re-Evaluation 01/31/15   PT Start Time 0804   PT Stop Time 0845   PT Time Calculation (min) 41 min   Activity Tolerance Patient tolerated treatment well   Behavior During Therapy [facility] for tasks assessed/performed      Past Medical History  Diagnosis Date  . Hypertension   . Diabetes mellitus without complication     "borderline diabetic"   . Hypercholesteremia   . Diabetes mellitus type 2, controlled, without complications 58/02/9832  . Cancer of tongue   . Anemia in neoplastic disease 06/01/2014  . GERD (gastroesophageal reflux disease) 06/08/2014  . Cancer     tongue  . Yeast infection of the skin 08/15/2014  . History of radiation therapy 08/07/14- 09/24/14    tongue, 70 Gy    Past Surgical History  Procedure Laterality Date  . Cervical ablation      Uterus  . Nasal fracture surgery  8250    Dr. Erik Obey    There were no vitals filed for this visit.  Visit Diagnosis:  Lymphedema  Stiffness of neck      Subjective Assessment - 01/16/15 0804    Subjective Been feeling a little bit better, I think its the thyroid medicine. My throat is doing well.    Currently in Pain? No/denies               LYMPHEDEMA/ONCOLOGY QUESTIONNAIRE - 01/16/15 5397    Head and Neck   4 cm superior to sternal notch around neck 39.9 cm   6 cm superior to sternal notch around neck 40.2 cm   8 cm superior to sternal notch around neck 40.5 cm                  [facility] Adult PT Treatment/Exercise - 01/16/15 0001    Manual Therapy   Manual Lymphatic Drainage  (MLD) In supine, supraclavicular fossae, bilateral shoulders, bilateral axillae; pectoral nodes;lateral, anterolateral, and anterior neck; posterolateral neck; chin, cheeks, and pre-auricular nodes; then lateral neck, supraclavidular fossae, upper chest from sternum toward shoulders, bilateral shoulders, and axillae.                PT Education - 01/16/15 0827    Education provided Yes   Education Details How to apply kinseiotape   Person(s) Educated Patient   Methods Explanation;Handout   Comprehension Verbalized understanding;Need further instruction           Short Term Clinic Goals - 10/30/14 1229    CC Short Term Goal  #1   Title short term goals=long term goals             Long Term Clinic Goals - 01/16/15 0805    CC Long Term Goal  #2   Title Patient will be independent in self manual lymph drainage   Status Achieved   CC Long Term Goal  #4   Title Patient will be know how to  use compression garments for maintenance phase of treatment  I've been measured for my compression garment, just waiting for arrival now.  Status Partially Met   CC Long Term Goal  #5   Title Patient will have a circumferential reduction of    1       cm at     8 cm above the    sternal notch  Pt reduced 0.6 cm from last measurement   Status On-going         Head and Neck Clinic Goals - 05/09/14 1520    Patient will be able to verbalize understanding of a home exercise program for cervical range of motion, posture, and walking.    Status Achieved   Patient will be able to verbalize understanding of proper sitting and standing posture.    Status Achieved   Patient will be able to verbalize understanding of lymphedema risk and availability of treatment for this condition.    Status Achieved           Plan - 01/16/15 0815    Clinical Impression Statement Pts circumferential measurements have reduced since last time measured and she has been only coming  1x/week now so she is doing well with the Maintenance Phase of treatment. Has been measured for compression garment and now awaiting the arrival of that. Has her PET scan next week. All but 1 goal met. Pt did not want to try kinesiotape today as she is going somewhere after treatment but is willing to try at home later.    Pt will benefit from skilled therapeutic intervention in order to improve on the following deficits Decreased range of motion;Increased edema;Decreased knowledge of use of DME;Decreased knowledge of precautions   Rehab Potential Excellent   Clinical Impairments Affecting Rehab Potential radiation therapy    PT Frequency 2x / week   PT Duration 4 weeks   PT Treatment/Interventions Manual lymph drainage;Taping   PT Next Visit Plan Assess next visit; continue manual lymph drainage and patient education consider kinesiotape to sternal area and lower neck trial     Consulted and Agree with Plan of Care Patient        Problem List Patient Active Problem List   Diagnosis Date Noted  . Lymphedema 10/25/2014  . Constipation 09/18/2014  . Nausea without vomiting 09/18/2014  . Dehydration 09/06/2014  . Radiation-induced dermatitis 09/06/2014  . Mucositis due to radiation therapy 09/06/2014  . Yeast infection of the skin 08/15/2014  . Thyroid nodule 07/20/2014  . Neuropathy due to chemotherapeutic drug 06/21/2014  . Other constipation 06/21/2014  . GERD (gastroesophageal reflux disease) 06/08/2014  . Leukopenia due to antineoplastic chemotherapy 06/08/2014  . Mucositis due to chemotherapy 06/08/2014  . Anemia in neoplastic disease 06/01/2014  . Diarrhea 05/23/2014  . Hypertension 05/19/2014  . S/P PICC central line placement 05/16/2014  . Cancer of base of tongue 05/09/2014  . Diabetes mellitus type 2, controlled, without complications 97/53/0051    Otelia Limes, PTA 01/16/2015, 8:51 AM  [place], [place], [address] Phone: [phone]   Fax:  [phone]

## 2015-01-21 NOTE — Progress Notes (Signed)
Pain Status: She is currently in no pain. Vital Signs:BP 131/74 mmHg  Pulse 76  Temp(Src) 97.5 F (36.4 C)  Ht 5' 7.5" (1.715 m)  Wt 157 lb 6.4 oz (71.396 kg)  BMI 24.27 kg/m2 Orthostatic Standing Vital Signs: BP: P: Pox: N/A  Nutritional Status a) intake: PO Diet: Regular b) using a feeding tube?: No c) weight changes, if any:  Wt Readings from Last 3 Encounters:  01/25/15 157 lb 6.4 oz (71.396 kg)  12/11/14 158 lb (71.668 kg)  10/24/14 175 lb 14.4 oz (79.788 kg)     Swallowing Status: 01/08/15: Appointment with Glendell Docker Schinke-next appt. 02/06/15.  Physical Therapy for Lymphedema - Next visit 02/13/15  Smoking or chewing tobacco? No  Dental (if applicable): When was last visit with dentistry 12/11/14  Using fluoride trays daily? Using fluoride at bedtime.  Using salt water and baking soda rinses and biotene rinses as needed. Seen by her  Dentist: Dr. Olena Heckle in [place] in January 21, 2015   When was last ENT visit? End of July When is next ENT visit? Will be seen next.  Summary of last medical oncology visit (if applicable) is 02/72/53.   Next med/onc visit is on: has been released  Imaging done in the last month (if applicable) revealed: New tiny sub-cm hypermetabolic left abdominal mesenteric lymph node, likely inflammatory in etiology given associated findings of chronic mesenteric panniculitis. Continued attention recommended on follow-up CT or PET.  Other notable issues, if any: None as stated by Ms. Michiel Sites

## 2015-01-23 ENCOUNTER — Ambulatory Visit: Payer: BLUE CROSS/BLUE SHIELD | Attending: Radiation Oncology | Admitting: Physical Therapy

## 2015-01-23 ENCOUNTER — Other Ambulatory Visit: Payer: Self-pay | Admitting: Obstetrics and Gynecology

## 2015-01-23 DIAGNOSIS — I890 Lymphedema, not elsewhere classified: Secondary | ICD-10-CM | POA: Insufficient documentation

## 2015-01-23 NOTE — Therapy (Signed)
[place], [place], [address] Phone: [phone]   Fax:  [phone]  Physical Therapy Treatment  Patient Details  Name: [name] MRN: [id number] Date of Birth: 1956-03-12 Referring Provider:  Eppie Gibson, MD  Encounter Date: 01/23/2015      PT End of Session - 01/23/15 0919    Visit Number 13   Number of Visits 19   Date for PT Re-Evaluation 01/31/15   PT Start Time 0847   PT Stop Time 0920   PT Time Calculation (min) 33 min      Past Medical History  Diagnosis Date  . Hypertension   . Diabetes mellitus without complication     "borderline diabetic"   . Hypercholesteremia   . Diabetes mellitus type 2, controlled, without complications 32/95/1884  . Cancer of tongue   . Anemia in neoplastic disease 06/01/2014  . GERD (gastroesophageal reflux disease) 06/08/2014  . Cancer     tongue  . Yeast infection of the skin 08/15/2014  . History of radiation therapy 08/07/14- 09/24/14    tongue, 70 Gy    Past Surgical History  Procedure Laterality Date  . Cervical ablation      Uterus  . Nasal fracture surgery  1660    Dr. Erik Obey    There were no vitals filed for this visit.  Visit Diagnosis:  Lymphedema      Subjective Assessment - 01/23/15 0850    Subjective pt states she will be having her JoviPak delivered today and is having  a PET scan tomorrow with results on Friday. She has not tried  the kinesiotape yet She says her energy level is improved possibly due to thyroid medicine    Pertinent History Pt. with 6 week h/o feeling "like something was in her throat" and a lump in her neck presented 04/23/14.  Diagnosed with malignant cells consistent with squamous cell carcinoma by fine needle aspiration.  CT showed several level II and II suspicious lymph nodes.She the underwernt chemo with 3 aggressive treaments and 35 treatments of  radiation that completed September 24, 2014   Patient Stated  Goals wants to do everything she can to make her feels better    Currently in Pain? No/denies                         [facility] Adult PT Treatment/Exercise - 01/23/15 0001    Manual Therapy   Manual Lymphatic Drainage (MLD) In supine, supraclavicular fossae, bilateral shoulders, bilateral axillae; pectoral nodes;lateral, anterolateral, and anterior neck; posterolateral neck; chin, cheeks, and pre-auricular nodes; then lateral neck, supraclavidular fossae, upper chest from sternum toward shoulders, bilateral shoulders, and axillae.                   Short Term Clinic Goals - 10/30/14 1229    CC Short Term Goal  #1   Title short term goals=long term goals             Long Term Clinic Goals - 01/16/15 0805    CC Long Term Goal  #2   Title Patient will be independent in self manual lymph drainage   Status Achieved   CC Long Term Goal  #4   Title Patient will be know how to  use compression garments for maintenance phase of treatment  I've been measured for my compression garment, just waiting for arrival now.    Status Partially Met   CC Long  Term Goal  #5   Title Patient will have a circumferential reduction of    1       cm at     8 cm above the    sternal notch  Pt reduced 0.6 cm from last measurement   Status On-going         Head and Neck Clinic Goals - 05/09/14 1520    Patient will be able to verbalize understanding of a home exercise program for cervical range of motion, posture, and walking.    Status Achieved   Patient will be able to verbalize understanding of proper sitting and standing posture.    Status Achieved   Patient will be able to verbalize understanding of lymphedema risk and availability of treatment for this condition.    Status Achieved           Plan - 01/23/15 0920    Clinical Impression Statement Pt will full day of appointments today and will start a new job on Monday. She did not want to do  kinesiotape today, but will continue to consider trying it at home. Athough lymphedema remains, it palpably softens after manual lymph drainage. Hopeful she will have benefit from JoviPak compression    Clinical Impairments Affecting Rehab Potential radiation therapy    PT Next Visit Plan Remeasure next visit after use of jovipak . continue manual lymph drainage and patient education consider kinesiotape to sternal area and lower neck trial  Possilbe discharge or extend renewal period with longer time between visits to assess effectiveness of home management    Consulted and Agree with Plan of Care Patient        Problem List Patient Active Problem List   Diagnosis Date Noted  . Lymphedema 10/25/2014  . Constipation 09/18/2014  . Nausea without vomiting 09/18/2014  . Dehydration 09/06/2014  . Radiation-induced dermatitis 09/06/2014  . Mucositis due to radiation therapy 09/06/2014  . Yeast infection of the skin 08/15/2014  . Thyroid nodule 07/20/2014  . Neuropathy due to chemotherapeutic drug 06/21/2014  . Other constipation 06/21/2014  . GERD (gastroesophageal reflux disease) 06/08/2014  . Leukopenia due to antineoplastic chemotherapy 06/08/2014  . Mucositis due to chemotherapy 06/08/2014  . Anemia in neoplastic disease 06/01/2014  . Diarrhea 05/23/2014  . Hypertension 05/19/2014  . S/P PICC central line placement 05/16/2014  . Cancer of base of tongue 05/09/2014  . Diabetes mellitus type 2, controlled, without complications 68/37/2902   Donato Heinz. Owens Shark, PT   01/23/2015, 9:26 AM  [place], [place], [address] Phone: [phone]   Fax:  [phone]

## 2015-01-24 ENCOUNTER — Ambulatory Visit (HOSPITAL_COMMUNITY)
Admission: RE | Admit: 2015-01-24 | Discharge: 2015-01-24 | Disposition: A | Discharge status: Home / Self Care | Payer: BLUE CROSS/BLUE SHIELD | Source: Ambulatory Visit | Attending: Radiation Oncology | Admitting: Radiation Oncology

## 2015-01-24 ENCOUNTER — Ambulatory Visit (HOSPITAL_BASED_OUTPATIENT_CLINIC_OR_DEPARTMENT_OTHER)
Admission: RE | Admit: 2015-01-24 | Discharge: 2015-01-24 | Disposition: A | Discharge status: Home / Self Care | Payer: BLUE CROSS/BLUE SHIELD | Source: Ambulatory Visit | Attending: Radiation Oncology | Admitting: Radiation Oncology

## 2015-01-24 DIAGNOSIS — C01 Malignant neoplasm of base of tongue: Secondary | ICD-10-CM | POA: Insufficient documentation

## 2015-01-24 DIAGNOSIS — R634 Abnormal weight loss: Secondary | ICD-10-CM

## 2015-01-24 LAB — TSH CHCC: TSH: 39.818 m(IU)/L — ABNORMAL HIGH (ref 0.308–3.960)

## 2015-01-24 LAB — CYTOLOGY - PAP

## 2015-01-24 LAB — GLUCOSE, CAPILLARY: GLUCOSE-CAPILLARY: 108 mg/dL — AB (ref 65–99)

## 2015-01-24 MED ORDER — FLUDEOXYGLUCOSE F - 18 (FDG) INJECTION
9.3000 | Freq: Once | INTRAVENOUS | Status: AC | PRN
  Administered 2015-01-24: 9.3 via INTRAVENOUS

## 2015-01-25 ENCOUNTER — Encounter: Payer: Self-pay | Admitting: Radiation Oncology

## 2015-01-25 ENCOUNTER — Ambulatory Visit
Admission: RE | Admit: 2015-01-25 | Discharge: 2015-01-25 | Disposition: A | Discharge status: Home / Self Care | Payer: BLUE CROSS/BLUE SHIELD | Source: Ambulatory Visit | Attending: Radiation Oncology | Admitting: Radiation Oncology

## 2015-01-25 VITALS — BP 131/74 | HR 76 | Temp 97.5°F | Ht 67.5 in | Wt 157.4 lb

## 2015-01-25 DIAGNOSIS — C01 Malignant neoplasm of base of tongue: Secondary | ICD-10-CM

## 2015-01-25 NOTE — Progress Notes (Signed)
Radiation Oncology         [phone] ________________________________  Name: [name] MRN: [id number]  Date: 01/25/2015  DOB: 1955-12-31  Follow-Up Visit Note  CC: Alice Huh, MD  Alice Marble, MD  Diagnosis and Prior Radiotherapy:       ICD-9-CM ICD-10-CM   1. 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 She has felt a bit "sluggish" but denies feeling cold. She recently saw her primary care physician, who put her on a thyroid medication about 2 weeks ago.  Pain Status: She is currently in no pain.  Nutritional Status  a) intake: PO Diet: Regular  b) using a feeding tube?: No  c) weight changes, if any:  Wt Readings from Last 3 Encounters:   01/25/15  157 lb 6.4 oz (71.396 kg)   12/11/14  158 lb (71.668 kg)   10/24/14  175 lb 14.4 oz (79.788 kg)    Swallowing Status: 01/08/15: Appointment with [name]-next appt. 02/06/15. Physical Therapy for Lymphedema - Next visit 02/13/15  Smoking or chewing tobacco? No  Dental (if applicable): When was last visit with dentistry 12/11/14 Using fluoride trays daily? Using fluoride at bedtime. Using salt water and baking soda rinses and biotene rinses as needed. Seen by her Dentist: [name] in [place] in January 21, 2015  When was last ENT visit? End of July When is next ENT visit? Will be seen next month.  Summary of last medical oncology visit (if applicable) is 25/36/64. Next med/onc visit is on: has been released  Imaging done in the last month (if applicable) revealed: New tiny sub-cm hypermetabolic left abdominal mesenteric lymph node, likely inflammatory in etiology given associated findings of chronic  mesenteric panniculitis. Continued attention recommended on follow-up CT or PET.  Other  notable issues, if any: None as stated by [name]                       ALLERGIES:  is allergic to adhesive and penicillins.  Meds: Current Outpatient Prescriptions  Medication Sig Dispense Refill  . ALPRAZolam (XANAX) 0.25 MG tablet Take 0.25 mg by mouth as needed for anxiety.    Marland Kitchen amLODipine (NORVASC) 10 MG tablet Take 5 mg by mouth daily.     Marland Kitchen aspirin 81 MG tablet Take 81 mg by mouth daily.    Marland Kitchen atorvastatin (LIPITOR) 10 MG tablet Take 10 mg by mouth daily.    Marland Kitchen levothyroxine (SYNTHROID, LEVOTHROID) 50 MCG tablet Take 50 mcg by mouth daily before breakfast.    . Multiple Vitamin (MULTIVITAMIN) tablet Take 1 tablet by mouth daily.    . sennosides-docusate sodium (SENOKOT-S) 8.6-50 MG tablet Take 2 tablets by mouth 2 (two) times daily.    . sodium fluoride (FLUORISHIELD) 1.1 % GEL dental gel Instill one drop of gel per tooth space of fluoride tray. Place over teeth for 5 minutes. Remove. Spit out excess. Repeat nightly. 120 mL prn   No current facility-administered medications for this encounter.    Physical Findings: The patient is in no acute distress. Patient is alert and oriented. Wt Readings from Last 3 Encounters:  01/25/15 157 lb 6.4 oz (71.396 kg)  12/11/14 158 lb (71.668 kg)  10/24/14 175 lb 14.4 oz (79.788  kg)    height is 5' 7.5" (1.715 m) and weight is 157 lb 6.4 oz (71.396 kg). Her temperature is 97.5 F (36.4 C). Her blood pressure is 131/74 and her pulse is 76. Marland Kitchen   HENT: Oral cavity and oropharynx show no lesions or thrush. NECK: She has some mild lymphedema in the anterior neck but no palpable lymph nodes. No palpable thyroid masses.  Abdomen: No abdominal tenderness or distension. No skin rash over the abdomen.  Heart: Regular in rate and rhythm. Lungs: Chest is clear to auscultation bilaterally. Ext/Musculoskeletal: No edema present in the extremities. Skin: Her skin has healed well over the neck. Psych: appropriate affect   Lab Findings: Lab Results    Component Value Date   WBC 4.8 09/06/2014   HGB 12.0 09/06/2014   HCT 36.2 09/06/2014   MCV 93.8 09/06/2014   PLT 188 09/06/2014    Lab Results  Component Value Date   TSH 39.818* 01/24/2015    Radiographic Findings: Nm Pet Image Restag (ps) Skull Base To Thigh  01/24/2015   CLINICAL DATA:  Subsequent treatment strategy for base of tongue carcinoma.  EXAM: NUCLEAR MEDICINE PET SKULL BASE TO THIGH  TECHNIQUE: 9.3 mCi F-18 FDG was injected intravenously. Full-ring PET imaging was performed from the skull base to thigh after the radiotracer. CT data was obtained and used for attenuation correction and anatomic localization.  FASTING BLOOD GLUCOSE:  Value: 108 mg/dl  COMPARISON:  07/19/2014  FINDINGS: NECK  No hypermetabolic lymph nodes or masses identified in the neck. Muscular activity is noted in the posterior neck, as well as increased diffuse thyroid metabolic activity.  CHEST  No hypermetabolic mediastinal or hilar nodes. No suspicious pulmonary nodules on the CT scan.  ABDOMEN/PELVIS  No abnormal hypermetabolic activity within the liver, pancreas, adrenal glands, or spleen.  A tiny sub-cm left abdominal mesenteric lymph node is seen which is hypermetabolic with SUV max of 9.5. This appears new since previous study. Associated diffuse mesenteric wispy soft tissue density is unchanged and likely due to panniculitis. Cholelithiasis again noted.  SKELETON  No focal hypermetabolic activity to suggest skeletal metastasis.  IMPRESSION: No definite evidence of recurrent or metastatic disease.  New tiny sub-cm hypermetabolic left abdominal mesenteric lymph node, likely inflammatory in etiology given associated findings of chronic mesenteric panniculitis. Continued attention recommended on follow-up CT or PET.   Electronically Signed   By: Earle Gell M.D.   On: 01/24/2015 12:55    Impression/Plan: NED w/abdominal node as above, favored to be inflammatory   Her TSH was 39 today and her family doctor  recently started her on a supplement for this.  She will see [name]  in one month.  Follow up in ~3 months. We will call with a decision as to what imaging we will get at that time after review at ENT board (abdominal node as above, favored to be inflammatory).    This document serves as a record of services personally performed by Eppie Gibson, MD. It was created on her behalf by Arlyce Harman, a trained medical scribe. The creation of this record is based on the scribe's personal observations and the provider's statements to them. This document has been checked and approved by the attending provider. _____________________________________   Eppie Gibson, MD

## 2015-01-30 ENCOUNTER — Ambulatory Visit: Payer: BLUE CROSS/BLUE SHIELD | Admitting: Physical Therapy

## 2015-02-04 ENCOUNTER — Telehealth (HOSPITAL_COMMUNITY): Payer: Self-pay | Admitting: Dentistry

## 2015-02-04 NOTE — Telephone Encounter (Signed)
02/04/2015  Patient:            [name] Date of Birth:  Feb 09, 1956 MRN:                [id number]   I called Tito Dine back and discussed future endodontic therapy. Patient is being referred to an endodontist for root canal therapy on tooth #20 by patient report. Previous radiation therapy should not interfere with this root canal therapy. I also suggested reevaluation of tooth #12 for retreatment by the endodontist. If surgery is needed in this area, I recommended discussion with Dr. Isidore Moos concerning previous dose to this area. Patient was also cautioned about having oral surgery, periodontal surgery, or endodontic surgery without discussion with Dr. Isidore Moos due to risk for osteoradionecrosis. Patient will have endodontist contact Dental Medicine or Dr. Isidore Moos as indicated. Lenn Cal, DDS

## 2015-02-06 ENCOUNTER — Other Ambulatory Visit: Payer: Self-pay | Admitting: Radiation Oncology

## 2015-02-06 ENCOUNTER — Ambulatory Visit: Payer: BLUE CROSS/BLUE SHIELD

## 2015-02-06 DIAGNOSIS — R599 Enlarged lymph nodes, unspecified: Secondary | ICD-10-CM

## 2015-02-06 DIAGNOSIS — M793 Panniculitis, unspecified: Secondary | ICD-10-CM

## 2015-02-07 ENCOUNTER — Telehealth: Payer: Self-pay | Admitting: *Deleted

## 2015-02-07 NOTE — Telephone Encounter (Signed)
CALLED PATIENT TO INFORM OF CT AND FU, LVM FOR A RETURN CALL 

## 2015-02-13 ENCOUNTER — Ambulatory Visit: Payer: BLUE CROSS/BLUE SHIELD | Admitting: Physical Therapy

## 2015-02-13 DIAGNOSIS — I890 Lymphedema, not elsewhere classified: Secondary | ICD-10-CM

## 2015-02-13 NOTE — Therapy (Addendum)
[place], [place], [address] Phone: [phone]   Fax:  [phone]  Physical Therapy Treatment  Patient Details  Name: [name] MRN: [id number] Date of Birth: [birth year] Referring Provider:  Gaynelle Arabian, MD  Encounter Date: 02/13/2015      PT End of Session - 02/13/15 1225    Visit Number 14   Number of Visits 23   Date for PT Re-Evaluation 04/15/15   PT Start Time 0845   PT Stop Time 0932   PT Time Calculation (min) 47 min   Activity Tolerance Patient tolerated treatment well      Past Medical History  Diagnosis Date  . Hypertension   . Diabetes mellitus without complication     "borderline diabetic"   . Hypercholesteremia   . Diabetes mellitus type 2, controlled, without complications 67/70/3403  . Cancer of tongue   . Anemia in neoplastic disease 06/01/2014  . GERD (gastroesophageal reflux disease) 06/08/2014  . Cancer     tongue  . Yeast infection of the skin 08/15/2014  . History of radiation therapy 08/07/14- 09/24/14    tongue, 70 Gy    Past Surgical History  Procedure Laterality Date  . Cervical ablation      Uterus  . Nasal fracture surgery  5248    Dr. Erik Obey    There were no vitals filed for this visit.  Visit Diagnosis:  Lymphedema      Subjective Assessment - 02/13/15 0850    Subjective Pt started her part time job (4 days a week for total of 28 hours) She has had a fatigue with this. She has been wearing JoviPak most nights for about 2 hours. She did not wear it last night and feels like its a little worse this morning.  the Jovi Pak is alot tighter than the regular garment   She has been doing some deep breathing, but has not been doing self manual massage as regularly as she had been.  She has 2 root canal procedures on her teeth done this week    Pertinent History Pt. with 6 week h/o feeling "like something was in her throat" and a lump in her neck presented  04/23/14.  Diagnosed with malignant cells consistent with squamous cell carcinoma by fine needle aspiration.  CT showed several level II and II suspicious lymph nodes.She the underwernt chemo with 3 aggressive treaments and 35 treatments of  radiation that completed September 24, 2014   Currently in Pain? No/denies               LYMPHEDEMA/ONCOLOGY QUESTIONNAIRE - 02/13/15 0855    Head and Neck   4 cm superior to sternal notch around neck 40.4 cm   6 cm superior to sternal notch around neck 40.8 cm   8 cm superior to sternal notch around neck 40.9 cm                  [facility] Adult PT Treatment/Exercise - 02/13/15 0001    Neck Exercises: Seated   Other Seated Exercise lymphatic flow series   Manual Therapy   Manual Therapy Edema management   Manual Lymphatic Drainage (MLD) In supine, supraclavicular fossae, bilateral shoulders, bilateral axillae; pectoral nodes;lateral, anterolateral, and anterior neck; posterolateral neck; chin, cheeks, and pre-auricular nodes; then lateral neck, supraclavidular fossae, upper chest from sternum toward shoulders, bilateral shoulders, and axillae.  Short Term Clinic Goals - 10/30/14 1229    CC Short Term Goal  #1   Title short term goals=long term goals             Long Term Clinic Goals - 02/13/15 1230    CC Long Term Goal  #1   Title Patient with verbalize an understanding of lymphedema risk reduction precautions   Status Achieved   CC Long Term Goal  #2   Title Patient will be independent in self manual lymph drainage   Status Achieved   CC Long Term Goal  #3   Title Patient will be independent in a basic home exercise program   CC Long Term Goal  #4   Title Patient will be know how to  use compression garments for maintenance phase of treatment   Status Achieved   CC Long Term Goal  #5   Title Patient will have a circumferential reduction of    1       cm at     8 cm above the    sternal notch    Status On-going         Head and Neck Clinic Goals - 05/09/14 1520    Patient will be able to verbalize understanding of a home exercise program for cervical range of motion, posture, and walking.    Status Achieved   Patient will be able to verbalize understanding of proper sitting and standing posture.    Status Achieved   Patient will be able to verbalize understanding of lymphedema risk and availability of treatment for this condition.    Status Achieved           Plan - 02/13/15 1226    Clinical Impression Statement Pt had increase in her measurements today, but could be because of 2 root canal dental procedures this week.  She seemed to respond to manual lymph drainage treatment today. She was encouraged to wear her jovipak as soon as she gets home today.  Would like to continue to see her in 3 weeks to see if she has had reduction as she stabalizes after dental surgery.    Pt will benefit from skilled therapeutic intervention in order to improve on the following deficits Decreased range of motion;Increased edema;Decreased knowledge of use of DME;Decreased knowledge of precautions   Rehab Potential Excellent   Clinical Impairments Affecting Rehab Potential radiation therapy    PT Frequency Other (comment)  one every 3 weeks    PT Duration 8 weeks  will lkely not need all this time   PT Treatment/Interventions Manual lymph drainage;Taping;Therapeutic exercise   PT Next Visit Plan Remeasure next visit . continue manual lymph drainage  Possilbe discharge if reduction/stabilzaion acheived   Consulted and Agree with Plan of Care Patient        Problem List Patient Active Problem List   Diagnosis Date Noted  . Lymphedema 10/25/2014  . Constipation 09/18/2014  . Nausea without vomiting 09/18/2014  . Dehydration 09/06/2014  . Radiation-induced dermatitis 09/06/2014  . Mucositis due to radiation therapy 09/06/2014  . Yeast infection of the skin 08/15/2014   . Thyroid nodule 07/20/2014  . Neuropathy due to chemotherapeutic drug 06/21/2014  . Other constipation 06/21/2014  . GERD (gastroesophageal reflux disease) 06/08/2014  . Leukopenia due to antineoplastic chemotherapy 06/08/2014  . Mucositis due to chemotherapy 06/08/2014  . Anemia in neoplastic disease 06/01/2014  . Diarrhea 05/23/2014  . Hypertension 05/19/2014  . S/P PICC central line placement 05/16/2014  .  Cancer of base of tongue 05/09/2014  . Diabetes mellitus type 2, controlled, without complications 69/48/5462   Donato Heinz. Owens Shark, PT  02/13/2015, 12:31 PM     PHYSICAL THERAPY DISCHARGE SUMMARY  Visits from Start of Care: 14  Current functional level related to goals / functional outcomes: unknown   Remaining deficits: unknown   Education / Equipment: Self manual lymph drainage, home exercise, use of compression  Plan: Patient agrees to discharge.  Patient goals were partially met. Patient is being discharged due to not returning since the last visit.  ?????        Maudry Diego, PT 09/03/2015 1:20 PM   [place], [place], [address] Phone: [phone]   Fax:  [phone]

## 2015-02-27 ENCOUNTER — Ambulatory Visit: Payer: BLUE CROSS/BLUE SHIELD | Admitting: Physical Therapy

## 2015-03-06 ENCOUNTER — Ambulatory Visit: Payer: BLUE CROSS/BLUE SHIELD | Attending: Radiation Oncology

## 2015-03-06 DIAGNOSIS — R1310 Dysphagia, unspecified: Secondary | ICD-10-CM | POA: Diagnosis present

## 2015-03-06 NOTE — Therapy (Signed)
[place] [address] [place], [place], [address] Phone: [phone]   Fax:  [phone]  Speech Language Pathology Treatment  Patient Details  Name: [name] MRN: [id number] Date of Birth: [birth year] Referring Provider:  Gaynelle Arabian, MD  Encounter Date: 03/06/2015      End of Session - 03/06/15 1544    Visit Number 6   Number of Visits 6   SLP Start Time 0174   SLP Stop Time  1528   SLP Time Calculation (min) 39 min   Activity Tolerance Patient tolerated treatment well      Past Medical History  Diagnosis Date  . Hypertension   . Diabetes mellitus without complication     "borderline diabetic"   . Hypercholesteremia   . Diabetes mellitus type 2, controlled, without complications 94/49/6759  . Cancer of tongue   . Anemia in neoplastic disease 06/01/2014  . GERD (gastroesophageal reflux disease) 06/08/2014  . Cancer     tongue  . Yeast infection of the skin 08/15/2014  . History of radiation therapy 08/07/14- 09/24/14    tongue, 70 Gy    Past Surgical History  Procedure Laterality Date  . Cervical ablation      Uterus  . Nasal fracture surgery  1638    Dr. Erik Obey    There were no vitals filed for this visit.  Visit Diagnosis: Dysphagia      Subjective Assessment - 03/06/15 1453    Subjective Pt had cheese pizza and salads this week. Early April last rad day.               ADULT SLP TREATMENT - 03/06/15 1500    General Information   Behavior/Cognition Alert;Cooperative;Pleasant mood   Treatment Provided   Treatment provided Dysphagia   Dysphagia Treatment   Treatment Methods Skilled observation;Therapeutic exercise   Patient observed directly with PO's Yes   Type of PO's observed Dysphagia 3 (soft);Thin liquids   Oral Phase Signs & Symptoms --  none   Pharyngeal Phase Signs & Symptoms --  none   Other treatment/comments Pt reports she is eating essentially what she wants  to at this time. Completing HEP independently.   Pain Assessment   Pain Assessment No/denies pain   Dysphagia Recommendations   Diet recommendations Regular;Thin liquid   Progression Toward Goals   Progression toward goals Goals met, education completed, patient discharged from [facility] - 11/09/14 1024    SLP [place] #1   Title pt will complete HEP with min A   Period --  therapy visits   Status Achieved   SLP SHORT TERM GOAL #2   Title pt will tell SLP why she is completing HEP   Period --  therapy visits   Status Achieved          SLP Long Term Goals - 03/06/15 1547    SLP LONG TERM GOAL #1   Title pt will tell SLP why food journal can be helpful for return to full PO diet following head/neck radiation   Status Achieved   SLP LONG TERM GOAL #2   Title pt will complete HEP with modified assistance over 4 sessions in order to maximize opportunity for WNL swallowing over time   Period --  visits   Status Partially Met  pt was independent for 2 months for HEP   SLP LONG TERM GOAL #3  Title pt will tell SLP 3 s/s/ aspiration PNA to minimize pt risk for pulmonary complications following head/neck radiation   Period --  visits   Status Deferred          Plan - 03/06/15 1545    Clinical Impression Statement Pt's frequency of HEP has improved, she did not need handout to compete exercises. Pt appears safe with current POS. Skilled ST is appropriate for discharge at this time as pt has remained safe with POs and independent with HEP.   Speech Therapy Frequency --  aprox every 4 weeks   Duration --  x4 visits   Treatment/Interventions Pharyngeal strengthening exercises;Oral motor exercises;SLP instruction and feedback;Patient/family education;Trials of upgraded texture/liquids     SPEECH THERAPY DISCHARGE SUMMARY  Visits from Start of Care: 6  Current functional level related to goals / functional outcomes: Pt met all  pertinent LTGs. SLP did not assess LTG for s/s aspiration PNA, as pt had already rec'd this handout earlier in treatment course. She remained independent with HEP over approx two months time, as well as remaining safe with POs over that same two month period.  SLP told pt she must cont HEP until second week in November then x3/week after that, due to intermittent adherence to HEP over rad course.   Remaining deficits: None observed.    Education / Equipment: HEP, s/s aspiration PNA, xerostomia.  Plan: Patient agrees to discharge.  Patient goals were partially met. Patient is being discharged due to meeting the stated rehab goals.  ?????        Problem List Patient Active Problem List   Diagnosis Date Noted  . Lymphedema 10/25/2014  . Constipation 09/18/2014  . Nausea without vomiting 09/18/2014  . Dehydration 09/06/2014  . Radiation-induced dermatitis 09/06/2014  . Mucositis due to radiation therapy 09/06/2014  . Yeast infection of the skin 08/15/2014  . Thyroid nodule 07/20/2014  . Neuropathy due to chemotherapeutic drug 06/21/2014  . Other constipation 06/21/2014  . GERD (gastroesophageal reflux disease) 06/08/2014  . Leukopenia due to antineoplastic chemotherapy 06/08/2014  . Mucositis due to chemotherapy 06/08/2014  . Anemia in neoplastic disease 06/01/2014  . Diarrhea 05/23/2014  . Hypertension 05/19/2014  . S/P PICC central line placement 05/16/2014  . Cancer of base of tongue 05/09/2014  . Diabetes mellitus type 2, controlled, without complications 49/70/2637    [facility] , [place], [place]  03/06/2015, 3:49 PM  [facility] [address] [place], [place], [address] Phone: [phone]   Fax:  [phone]

## 2015-03-07 ENCOUNTER — Ambulatory Visit: Payer: BLUE CROSS/BLUE SHIELD

## 2015-04-03 ENCOUNTER — Encounter: Payer: Self-pay | Admitting: *Deleted

## 2015-04-03 NOTE — Progress Notes (Signed)
Ms. Lovan and spouse attended the 5-week [facility] program (Tuesday sessions from 6-7:15 pm, Tea, beginning 03/05/15).  [name], RN, BSN, [place] [facility] [phone]

## 2015-05-07 ENCOUNTER — Telehealth: Payer: Self-pay | Admitting: *Deleted

## 2015-05-07 NOTE — Telephone Encounter (Signed)
Called patient to inform that she has duplicate fu appts., cancelling the one for 05-08-15 , pt. Has test on 05-09-15 and fu will be on 05-10-15, lvm for a return call

## 2015-05-08 ENCOUNTER — Ambulatory Visit
Admission: RE | Admit: 2015-05-08 | Payer: BLUE CROSS/BLUE SHIELD | Source: Ambulatory Visit | Admitting: Radiation Oncology

## 2015-05-09 ENCOUNTER — Ambulatory Visit (HOSPITAL_COMMUNITY)
Admission: RE | Admit: 2015-05-09 | Discharge: 2015-05-09 | Disposition: A | Discharge status: Home / Self Care | Payer: BLUE CROSS/BLUE SHIELD | Source: Ambulatory Visit | Attending: Radiation Oncology | Admitting: Radiation Oncology

## 2015-05-09 ENCOUNTER — Encounter (HOSPITAL_COMMUNITY): Payer: Self-pay

## 2015-05-09 DIAGNOSIS — R590 Localized enlarged lymph nodes: Secondary | ICD-10-CM | POA: Diagnosis not present

## 2015-05-09 DIAGNOSIS — K8020 Calculus of gallbladder without cholecystitis without obstruction: Secondary | ICD-10-CM | POA: Diagnosis not present

## 2015-05-09 DIAGNOSIS — D175 Benign lipomatous neoplasm of intra-abdominal organs: Secondary | ICD-10-CM | POA: Diagnosis not present

## 2015-05-09 DIAGNOSIS — K5730 Diverticulosis of large intestine without perforation or abscess without bleeding: Secondary | ICD-10-CM | POA: Diagnosis not present

## 2015-05-09 DIAGNOSIS — Z09 Encounter for follow-up examination after completed treatment for conditions other than malignant neoplasm: Secondary | ICD-10-CM | POA: Insufficient documentation

## 2015-05-09 DIAGNOSIS — M793 Panniculitis, unspecified: Secondary | ICD-10-CM

## 2015-05-09 DIAGNOSIS — R599 Enlarged lymph nodes, unspecified: Secondary | ICD-10-CM

## 2015-05-09 DIAGNOSIS — R935 Abnormal findings on diagnostic imaging of other abdominal regions, including retroperitoneum: Secondary | ICD-10-CM | POA: Diagnosis present

## 2015-05-09 DIAGNOSIS — R591 Generalized enlarged lymph nodes: Secondary | ICD-10-CM | POA: Diagnosis present

## 2015-05-10 ENCOUNTER — Encounter: Payer: Self-pay | Admitting: Radiation Oncology

## 2015-05-10 ENCOUNTER — Ambulatory Visit
Admission: RE | Admit: 2015-05-10 | Discharge: 2015-05-10 | Disposition: A | Discharge status: Home / Self Care | Payer: BLUE CROSS/BLUE SHIELD | Source: Ambulatory Visit | Attending: Radiation Oncology | Admitting: Radiation Oncology

## 2015-05-10 VITALS — BP 144/69 | HR 67 | Temp 97.7°F | Resp 18 | Ht 67.5 in | Wt 160.3 lb

## 2015-05-10 DIAGNOSIS — C01 Malignant neoplasm of base of tongue: Secondary | ICD-10-CM

## 2015-05-10 NOTE — Progress Notes (Signed)
Pain issues, if any: no Using a feeding tube?: no Weight changes, if any: no Swallowing issues, if any: no - reports all her taste buds are back except for sweat foods.   Smoking or chewing tobacco? no Using fluoride trays daily? no Last ENT visit was on: Will see Dr. Erik Obey in December. Other notable issues, if any: Reports her energy level has improved.  She does not have any skin irritation.  She is worried about her CT Scan results.  BP 144/69 mmHg  Pulse 67  Temp(Src) 97.7 F (36.5 C) (Oral)  Resp 18  Ht 5' 7.5" (1.715 m)  Wt 160 lb 4.8 oz (72.712 kg)  BMI 24.72 kg/m2  SpO2 100%   Wt Readings from Last 3 Encounters:  05/10/15 160 lb 4.8 oz (72.712 kg)  01/25/15 157 lb 6.4 oz (71.396 kg)  12/11/14 158 lb (71.668 kg)

## 2015-05-10 NOTE — Progress Notes (Signed)
Radiation Oncology         [phone] ________________________________  Name: [name] MRN: [id number]  Date: 05/10/2015  DOB: [birth year]  Follow-Up Visit Note  CC: Simona Huh, MD  Jodi Marble, MD  Diagnosis and Prior Radiotherapy:       ICD-9-CM ICD-10-CM   1. 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 Pain issues, if any: no  Using a feeding tube?: no  Weight changes, if any: no  Swallowing issues, if any: no - reports all her taste buds are back except for sweet foods.  Smoking or chewing tobacco? no  Using fluoride trays daily? no  Last ENT visit was on: Will see Dr. Erik Obey in December.  Other notable issues, if any: Reports her energy level has improved. She does not have any skin irritation. She is worried about her CT scan results from 05/09/15.     No weight loss or  abdominal complaints.                    ALLERGIES:  is allergic to adhesive and penicillins.  Meds: Current Outpatient Prescriptions  Medication Sig Dispense Refill  . ALPRAZolam (XANAX) 0.25 MG tablet Take 0.25 mg by mouth as needed for anxiety.    Marland Kitchen amLODipine (NORVASC) 10 MG tablet Take 5 mg by mouth daily.     Marland Kitchen aspirin 81 MG tablet Take 81 mg by mouth daily.    Marland Kitchen atorvastatin (LIPITOR) 10 MG tablet Take 10 mg by mouth daily.    Marland Kitchen levothyroxine (SYNTHROID, LEVOTHROID) 50 MCG tablet Take 112 mcg by mouth daily before breakfast.    . Multiple Vitamin (MULTIVITAMIN) tablet Take 1 tablet by mouth daily.    . sennosides-docusate sodium (SENOKOT-S) 8.6-50 MG tablet  Take 2 tablets by mouth 2 (two) times daily.    . sodium fluoride (FLUORISHIELD) 1.1 % GEL dental gel Instill one drop of gel per tooth space of fluoride tray. Place over teeth for 5 minutes. Remove. Spit out excess. Repeat nightly. 120 mL prn   No current facility-administered medications for this encounter.    Physical Findings: The patient is in no acute distress. Patient is alert and oriented. Wt Readings from Last 3 Encounters:  05/10/15 160 lb 4.8 oz (72.712 kg)  01/25/15 157 lb 6.4 oz (71.396 kg)  12/11/14 158 lb (71.668 kg)    height is 5' 7.5" (1.715 m) and weight is 160 lb 4.8 oz (72.712 kg). Her oral temperature is 97.7 F (36.5 C). Her blood pressure is 144/69 and her pulse is 67. Her respiration is 18 and oxygen saturation is 100%.  Oral cavity and oropharynx is clear with no lesions. Pt has anterior neck lymphedema (but has completed PT massage therapy). She still wears a compression device at home. Abdomen is soft, non-tender, non-distended. Lungs are clear to auscultation bilaterally. Heart has regular rate and rhythm. No palpable cervical, supraclavicular adenopathy.  Lab Findings: Lab  Results  Component Value Date   WBC 4.8 09/06/2014   HGB 12.0 09/06/2014   HCT 36.2 09/06/2014   MCV 93.8 09/06/2014   PLT 188 09/06/2014    Lab Results  Component Value Date   TSH 39.818* 01/24/2015    Radiographic Findings: Ct Abdomen Wo Contrast  05/09/2015  CLINICAL DATA:  Base of tongue carcinoma. Indeterminate hypermetabolic subcentimeter left mesenteric lymph node on recent PET-CT study, presenting for follow-up. EXAM: CT ABDOMEN WITHOUT CONTRAST TECHNIQUE: Multidetector CT imaging of the abdomen was performed following the standard protocol without IV contrast. COMPARISON:  01/24/2015 PET-CT. FINDINGS: Lower chest: No significant pulmonary nodules or acute consolidative airspace disease. Hepatobiliary: Normal liver with no liver mass. Large calcified 2.3 cm gallstone in the  nondistended gallbladder, with no gallbladder wall thickening or pericholecystic fat stranding or fluid. No biliary ductal dilatation. Pancreas: There is diffuse fatty infiltration of the otherwise normal pancreas. Spleen: Normal size. No mass. Adrenals/Urinary Tract: Normal adrenals. No hydronephrosis. No nephrolithiasis. Small parapelvic renal cysts in the left greater than right renal sinuses. Two subcentimeter hypodense lesions in the right kidney, too small to characterize, unchanged. Stomach/Bowel: Grossly normal stomach. Visualized small and large bowel is normal caliber, with no bowel wall thickening. Mild diverticulosis in the visualized descending/proximal sigmoid colon. Vascular/Lymphatic: Normal caliber abdominal aorta. There is prominent haziness of the central and left mesenteric fat, with several subcentimeter associated central and left mesenteric lymph nodes, not appreciably changed. There is a new mildly enlarged 1.0 cm aortocaval node (series 2/ image 39). Otherwise no pathologically enlarged lymph nodes in the abdomen. Other: No pneumoperitoneum, ascites or focal fluid collection. Musculoskeletal: No aggressive appearing focal osseous lesions. Mild degenerative changes in the visualized thoracolumbar spine. IMPRESSION: 1. Persistent prominent haziness of the central and left mesenteric fat with associated stable subcentimeter mesenteric lymph nodes. New mildly enlarged aortocaval retroperitoneal node. Mesenteric fat stranding is a non-specific finding most commonly secondary to benign causes such as edema or panniculitis, however can also rarely be due to lymphoma or small bowel carcinoid. Given the development of a new mildly enlarged retroperitoneal lymph node (and the presence of focal mesenteric hypermetabolism on the 01/24/15 PET study), continued imaging surveillance is warranted in 3-6 months, preferably with PET/CT. 2. Cholelithiasis. 3. Distal colonic diverticulosis. Electronically Signed    By: Ilona Sorrel M.D.   On: 05/09/2015 13:04    Impression/Plan: NED   Her PCP prescribed Levothyroxine 48mcg, but was bumped up to 112 mcg for preexisting hypothyroidism.    Lab Results  Component Value Date   TSH 39.818* 01/24/2015   ENT: She will see Dr. Erik Obey  in December.  CT IMAGING: We discussed the results to her CT of her abdomen, which are likely benign; but, I will order a PET in 4.5 mo per radiology's precautionary recommendations. Follow up in 4.5 months .   DENTAL: The patient notified me that one of her crowns fell out. I advised her to speak with Dr. Enrique Sack about this.   This document serves as a record of services personally performed by Eppie Gibson, MD. It was created on her behalf by Darcus Austin, a trained medical scribe. The creation of this record is based on the scribe's personal observations and the provider's statements to them. This document has been checked and approved by the attending provider.    _____________________________________   Eppie Gibson, MD

## 2015-05-13 ENCOUNTER — Telehealth: Payer: Self-pay | Admitting: *Deleted

## 2015-05-13 NOTE — Telephone Encounter (Signed)
CALLED PATIENT TO INFORM OF PET SCAN FOR 10-03-15 AND HER FU FOR 10-04-15 WITH DR. Isidore Moos, LVM FOR A RETURN CALL

## 2015-05-22 ENCOUNTER — Ambulatory Visit: Payer: BLUE CROSS/BLUE SHIELD | Admitting: Radiation Oncology

## 2015-09-26 ENCOUNTER — Telehealth: Payer: Self-pay | Admitting: Radiation Oncology

## 2015-09-26 ENCOUNTER — Telehealth: Payer: Self-pay | Admitting: Oncology

## 2015-09-26 NOTE — Telephone Encounter (Signed)
s.w. pt and confirmed all appts.....pt ok and aware °

## 2015-10-02 ENCOUNTER — Telehealth: Payer: Self-pay | Admitting: *Deleted

## 2015-10-02 NOTE — Telephone Encounter (Signed)
CALLED PATIENT TO INFORM THAT CT AND FU HAVE BEEN RESCHEDULED, LVM FOR A RETURN CALL

## 2015-10-03 ENCOUNTER — Ambulatory Visit (HOSPITAL_COMMUNITY): Payer: BLUE CROSS/BLUE SHIELD

## 2015-10-04 ENCOUNTER — Ambulatory Visit
Admission: RE | Admit: 2015-10-04 | Discharge: 2015-10-04 | Disposition: A | Discharge status: Home / Self Care | Payer: BLUE CROSS/BLUE SHIELD | Source: Ambulatory Visit | Attending: Radiation Oncology | Admitting: Radiation Oncology

## 2015-10-11 ENCOUNTER — Ambulatory Visit (HOSPITAL_COMMUNITY): Payer: BLUE CROSS/BLUE SHIELD

## 2015-10-11 NOTE — Progress Notes (Signed)
[name] is here for follow up of radiation completed 09/24/14 to her Base of Tongue and Bilateral Neck.      Pain issues, if any: No Using a feeding tube?: No Weight changes, if any:  Wt Readings from Last 3 Encounters:  10/16/15 174 lb 6.4 oz (79.107 kg)  05/10/15 160 lb 4.8 oz (72.712 kg)  01/25/15 157 lb 6.4 oz (71.396 kg)   Swallowing issues, if any: She denies any swallowing problems. She states she does not eat "stringy chicken" or other foods similar. Smoking or chewing tobacco? No Using fluoride trays daily? Yes, daily Last ENT visit was on: Dr. Erik Obey [id number]. She is to see him every 6 months for assessment.  Other notable issues, if any:   BP 142/67 mmHg  Pulse 75  Temp(Src) 97.9 F (36.6 C)  Ht 5' 7.5" (1.715 m)  Wt 174 lb 6.4 oz (79.107 kg)  BMI 26.90 kg/m2  SpO2 99%

## 2015-10-15 ENCOUNTER — Ambulatory Visit (HOSPITAL_COMMUNITY)
Admission: RE | Admit: 2015-10-15 | Discharge: 2015-10-15 | Disposition: A | Discharge status: Home / Self Care | Payer: BLUE CROSS/BLUE SHIELD | Source: Ambulatory Visit | Attending: Radiation Oncology | Admitting: Radiation Oncology

## 2015-10-15 DIAGNOSIS — C01 Malignant neoplasm of base of tongue: Secondary | ICD-10-CM | POA: Insufficient documentation

## 2015-10-15 LAB — GLUCOSE, CAPILLARY: Glucose-Capillary: 111 mg/dL — ABNORMAL HIGH (ref 65–99)

## 2015-10-15 MED ORDER — FLUDEOXYGLUCOSE F - 18 (FDG) INJECTION
8.0000 | Freq: Once | INTRAVENOUS | Status: AC | PRN
  Administered 2015-10-15: 8 via INTRAVENOUS

## 2015-10-16 ENCOUNTER — Encounter: Payer: Self-pay | Admitting: *Deleted

## 2015-10-16 ENCOUNTER — Ambulatory Visit
Admission: RE | Admit: 2015-10-16 | Discharge: 2015-10-16 | Disposition: A | Discharge status: Home / Self Care | Payer: BLUE CROSS/BLUE SHIELD | Source: Ambulatory Visit | Attending: Radiation Oncology | Admitting: Radiation Oncology

## 2015-10-16 ENCOUNTER — Encounter: Payer: Self-pay | Admitting: Radiation Oncology

## 2015-10-16 VITALS — BP 142/67 | HR 75 | Temp 97.9°F | Ht 67.5 in | Wt 174.4 lb

## 2015-10-16 DIAGNOSIS — C01 Malignant neoplasm of base of tongue: Secondary | ICD-10-CM

## 2015-10-16 NOTE — Progress Notes (Signed)
  Oncology Nurse Navigator Documentation  Navigator Location: [facility] (10/16/15 1700) Navigator Encounter Type: Clinic/MDC (10/16/15 1700)   Abnormal Finding Date: 04/20/14 (10/16/15 1700) Confirmed Diagnosis Date: 04/23/14 (10/16/15 1700)   Treatment Initiated Date: 05/11/14 (10/16/15 1700) Patient Visit Type: Follow-up;RadOnc (10/16/15 1700) Treatment Phase: Post-Tx Follow-up (10/16/15 1700) Barriers/Navigation Needs: No barriers at this time;No Questions;No Needs (10/16/15 1700)   Interventions: None required (10/16/15 1700)            Acuity: Level 1 (10/16/15 1700)       Met with Ms. Shave and her husband during f/u appt with Dr. Isidore Moos.  She voiced understanding of yesterday's PET results and indication of NED. She noted she has a follow-up appt with Dr. Erik Obey in July.   She voiced understanding she will be referred to Mike Craze, NP, for Survivorship, with an an expected appt in October.  She expressed appreciation for the care she received from Dr. Isidore Moos and this navigator.  [name], RN, BSN, [facility] [phone]        Time Spent with Patient: 30 (10/16/15 1700)

## 2015-10-18 NOTE — Progress Notes (Signed)
Radiation Oncology         [phone] ________________________________  Name: [name] MRN: [id number]  Date: 10/16/2015  DOB: 01-23-1956  Follow-Up Visit Note  CC: Alice Huh, MD  Alice Marble, MD  Diagnosis and Prior Radiotherapy:       ICD-9-CM ICD-10-CM   1. 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 Pain issues, if any: no   Using a feeding tube?: No Weight changes, if any:  Wt Readings from Last 3 Encounters:  10/16/15 174 lb 6.4 oz (79.107 kg)  05/10/15 160 lb 4.8 oz (72.712 kg)  01/25/15 157 lb 6.4 oz (71.396 kg)   Swallowing issues, if any: She denies any swallowing problems. She states she does not eat "stringy chicken" or other foods similar. Smoking or chewing tobacco? No Using fluoride trays daily? Yes, daily Last ENT visit was on: Dr. Erik Roberts [id number]. She is to see him every 6 months for assessment.  Other notable issues, if any:  none                   ALLERGIES:  is allergic to adhesive and penicillins.  Meds: Current Outpatient Prescriptions  Medication Sig Dispense Refill  . ALPRAZolam (XANAX) 0.25 MG tablet Take 0.25 mg by mouth as needed for anxiety.    Marland Kitchen amLODipine (NORVASC) 10 MG tablet Take 5 mg by mouth daily.     Marland Kitchen aspirin 81 MG tablet Take 81 mg by mouth daily.    Marland Kitchen levothyroxine (SYNTHROID, LEVOTHROID) 50 MCG tablet Take 112 mcg by mouth daily before breakfast.    . Multiple Vitamin (MULTIVITAMIN) tablet Take 1 tablet by mouth daily.    . sennosides-docusate sodium (SENOKOT-S) 8.6-50 MG tablet Take 2 tablets  by mouth 2 (two) times daily.    . sodium fluoride (FLUORISHIELD) 1.1 % GEL dental gel Instill one drop of gel per tooth space of fluoride tray. Place over teeth for 5 minutes. Remove. Spit out excess. Repeat nightly. 120 mL prn  . atorvastatin (LIPITOR) 10 MG tablet Take 10 mg by mouth daily.     No current facility-administered medications for this encounter.    Physical Findings: The patient is in no acute distress. Patient is alert and oriented. Wt Readings from Last 3 Encounters:  10/16/15 174 lb 6.4 oz (79.107 kg)  05/10/15 160 lb 4.8 oz (72.712 kg)  01/25/15 157 lb 6.4 oz (71.396 kg)    height is 5' 7.5" (1.715 m) and weight is 174 lb 6.4 oz (79.107 kg). Her temperature is 97.9 F (36.6 C). Her blood pressure is 142/67 and her pulse is 75. Her oxygen saturation is 99%.  Oral cavity and oropharynx is clear with no lesions. Pt has anterior neck lymphedema, mild (she has completed PT massage therapy).   No palpable cervical, supraclavicular adenopathy. Psych: pleasant demeanor, affect is appropriate.  Ambulatory, No focal neurologic findings.  Lab Findings: Lab Results  Component Value Date  WBC 4.8 09/06/2014   HGB 12.0 09/06/2014   HCT 36.2 09/06/2014   MCV 93.8 09/06/2014   PLT 188 09/06/2014    Lab Results  Component Value Date   TSH 39.818* 01/24/2015    Radiographic Findings: Nm Pet Image Restag (ps) Skull Base To Thigh  10/15/2015  CLINICAL DATA:  Subsequent treatment strategy for cancer at base of tone. EXAM: NUCLEAR MEDICINE PET SKULL BASE TO THIGH TECHNIQUE: 8.0 mCi F-18 FDG was injected intravenously. Full-ring PET imaging was performed from the skull base to thigh after the radiotracer. CT data was obtained and used for attenuation correction and anatomic localization. FASTING BLOOD GLUCOSE:  Value: 111 mg/dl COMPARISON:  01/24/2015. FINDINGS: NECK Muscular activity within the neck, without nodal hypermetabolism. No residual or recurrent primary tongue base  hypermetabolism. CHEST No areas of abnormal hypermetabolism. ABDOMEN/PELVIS No residual mesenteric hypermetabolism. The precaval node described on the prior diagnostic CT measures 6 mm on image 135/series 4. Compare 1.0 cm on the prior diagnostic CT. This is not hypermetabolic. No pelvic sidewall adenopathy hypermetabolism. SKELETON No abnormal marrow activity. CT IMAGES PERFORMED FOR ATTENUATION CORRECTION No cervical adenopathy. Small hiatal hernia. No thoracic adenopathy. Cholelithiasis. Redemonstration of increased density in the jejunal mesenteric fat with small nodes within. Mild pelvic floor laxity. IMPRESSION: 1. No findings to suggest recurrent or metastatic head neck cancer. 2. No hypermetabolism to correspond to the abdominal precaval node, which is decreased in size and was likely reactive. 3. Resolution of mesenteric nodal hypermetabolism. Residual mesenteric findings which are likely related to adenitis/panniculitis. Electronically Signed   By: [name] M.D.   On: 10/15/2015 09:55    Impression/Plan: NED - discussed PET results.  Will continue following with physical exams. She is likely cured.   Thyroid - taking  Levothyroxine via her PCP for preexisting hypothyroidism.    Labs per PCP  Dental followup- compliant  Tobacco - abstaining  No swallowing issues or nutritional issues. Encouraged to perform SLP exercises.  Followup plans: she'll call ENT for followup. She'll see  Alice Craze, NP of survivorship in 6 months.  I encouraged patient to call me or [name] with any concerns or questions that might arise before then.  I will see patient about 6 mo thereafter, per [name]'s discretion at that visit, and sooner in needed.  _____________________________________   Eppie Gibson, MD

## 2015-10-21 ENCOUNTER — Telehealth: Payer: Self-pay | Admitting: *Deleted

## 2015-10-21 NOTE — Telephone Encounter (Signed)
CALLED PATIENT TO INFORM OF SURVIVORSHIP APPT. FOR 04-16-16, LVM FOR A RETURN CALL

## 2016-03-04 DIAGNOSIS — R809 Proteinuria, unspecified: Secondary | ICD-10-CM | POA: Diagnosis not present

## 2016-03-04 DIAGNOSIS — E039 Hypothyroidism, unspecified: Secondary | ICD-10-CM | POA: Diagnosis not present

## 2016-03-04 DIAGNOSIS — E7800 Pure hypercholesterolemia, unspecified: Secondary | ICD-10-CM | POA: Diagnosis not present

## 2016-03-04 DIAGNOSIS — Z23 Encounter for immunization: Secondary | ICD-10-CM | POA: Diagnosis not present

## 2016-03-04 DIAGNOSIS — E1129 Type 2 diabetes mellitus with other diabetic kidney complication: Secondary | ICD-10-CM | POA: Diagnosis not present

## 2016-03-04 DIAGNOSIS — I10 Essential (primary) hypertension: Secondary | ICD-10-CM | POA: Diagnosis not present

## 2016-03-25 DIAGNOSIS — Z1231 Encounter for screening mammogram for malignant neoplasm of breast: Secondary | ICD-10-CM | POA: Diagnosis not present

## 2016-03-25 DIAGNOSIS — Z01419 Encounter for gynecological examination (general) (routine) without abnormal findings: Secondary | ICD-10-CM | POA: Diagnosis not present

## 2016-03-25 DIAGNOSIS — Z6829 Body mass index (BMI) 29.0-29.9, adult: Secondary | ICD-10-CM | POA: Diagnosis not present

## 2016-04-10 DIAGNOSIS — Z1211 Encounter for screening for malignant neoplasm of colon: Secondary | ICD-10-CM | POA: Diagnosis not present

## 2016-04-10 DIAGNOSIS — Z8371 Family history of colonic polyps: Secondary | ICD-10-CM | POA: Diagnosis not present

## 2016-04-10 DIAGNOSIS — K5730 Diverticulosis of large intestine without perforation or abscess without bleeding: Secondary | ICD-10-CM | POA: Diagnosis not present

## 2016-04-15 DIAGNOSIS — Z85810 Personal history of malignant neoplasm of tongue: Secondary | ICD-10-CM | POA: Diagnosis not present

## 2016-04-15 DIAGNOSIS — C770 Secondary and unspecified malignant neoplasm of lymph nodes of head, face and neck: Secondary | ICD-10-CM | POA: Diagnosis not present

## 2016-04-15 NOTE — Progress Notes (Deleted)
CLINIC:  Survivorship  REASON FOR VISIT:  Routine follow-up for history of head & neck cancer.  BRIEF ONCOLOGIC HISTORY:  Oncology History   Cancer of base of tongue   Staging form: Lip and Oral Cavity, AJCC 7th Edition     Clinical: Stage IVA (T3, N2b, M0) - Signed by Heath Lark, MD on 05/09/2014       Cancer of base of tongue ([place])   04/20/2014 Imaging    CT neck showed advanced base of tongue mass and multiple right neck lymphadenopathy      04/23/2014 Pathology Results    [id number] FNA of right neck is positive for squamous cell cancer, P16 positive      05/09/2014 Imaging    PET/CT scan confirmed base of tongue cancer along with lymph node metastasis.      05/10/2014 Procedure    She has placement of PICC line.      05/11/2014 - 06/26/2014 Chemotherapy    She received 3 cycles of induction chemotherapy with Taxotere, cisplatin and 5-FU.      05/19/2014 - 05/22/2014 Hospital Admission    She was admitted to the hospital for management of neutropenic fever, GI bleed requiring blood transfusion and diarrhea from chemotherapy      07/19/2014 Imaging    Repeat PET/CT scan showed complete response to treatment      08/07/2014 - 09/24/2014 Radiation Therapy    Rec'd RT to base of tongue and bilateral neck:  70 Gy in 35 fractions to gross disease, 63 Gy in 35 fractions to high risk nodal echelons, 56 Gy in 35 fractons to intermediate risk nodal echelons.      10/05/2014 Procedure    PICC removed.      01/24/2015 PET scan    No definite evidence of recurrent or metastatic disease.        INTERVAL HISTORY:  ***  -Pain:  -Nutrition/Diet:  -Dysphagia?:  -Dental issues?: using fluoride trays?  -Last TSH:  -Weight: (LOSS/GAIN) since (last visit)   -Last ENT visit:   -Last Rad Onc visit:  -Last Dentist visit:     ADDITIONAL REVIEW OF SYSTEMS:  ROS    CURRENT MEDICATIONS:  Current Outpatient Prescriptions on File Prior to Visit  Medication Sig Dispense  Refill  . ALPRAZolam (XANAX) 0.25 MG tablet Take 0.25 mg by mouth as needed for anxiety.    Marland Kitchen amLODipine (NORVASC) 10 MG tablet Take 5 mg by mouth daily.     Marland Kitchen aspirin 81 MG tablet Take 81 mg by mouth daily.    Marland Kitchen atorvastatin (LIPITOR) 10 MG tablet Take 10 mg by mouth daily.    Marland Kitchen levothyroxine (SYNTHROID, LEVOTHROID) 50 MCG tablet Take 112 mcg by mouth daily before breakfast.    . Multiple Vitamin (MULTIVITAMIN) tablet Take 1 tablet by mouth daily.    . sennosides-docusate sodium (SENOKOT-S) 8.6-50 MG tablet Take 2 tablets by mouth 2 (two) times daily.    . sodium fluoride (FLUORISHIELD) 1.1 % GEL dental gel Instill one drop of gel per tooth space of fluoride tray. Place over teeth for 5 minutes. Remove. Spit out excess. Repeat nightly. 120 mL prn   No current facility-administered medications on file prior to visit.     ALLERGIES:  Allergies  Allergen Reactions  . Adhesive [Tape] Rash  . Penicillins Hives and Other (See Comments)    "Tongue swelling"     PHYSICAL EXAM:  There were no vitals filed for this visit. There were no vitals filed for this  visit.  Weight Date                     Pre-treatment (RT consult date):    General: Well-nourished, well-appearing female/female*** in no acute distress.  Accompanied/Unaccompanied today.  HEENT: Head is atraumatic and normocephalic.  Pupils equal and reactive to light. Conjunctivae clear without exudate.  Sclerae anicteric. Oral mucosa is pink and moist without lesions.  Tongue pink, moist, and midline. Oropharynx is pink and moist, without lesions. Lymph: No preauricular, postauricular, cervical, supraclavicular, or infraclavicular lymphadenopathy noted on palpation.   Neck: No palpable masses. Skin on neck is ***.  Cardiovascular: Normal rate and rhythm. Respiratory: Clear to auscultation bilaterally. Chest expansion symmetric without accessory muscle use; breathing non-labored.  GI: Abdomen soft and round. Non-tender,  non-distended. Bowel sounds normoactive.  GU: Deferred.   Neuro: No focal deficits. Steady gait.   Psych: Normal mood and affect for situation. Extremities: No edema.  Skin: Warm and dry.    LABORATORY DATA:  None at this visit.***  DIAGNOSTIC IMAGING:  None at this visit.    ASSESSMENT & PLAN:  Ms. Mando is a pleasant 60 y.o. female/female*** with history of ***, diagnosed in ***(date);  treated with ***; completed treatment on (date).  Patient presents to survivorship clinic today for routine follow-up after finishing treatment.   1. Cancer***:  Ms. Pearman is clinically without evidence of disease or recurrence on physical exam today.     #. Nutritional status: Ms. Eckels reports that she is currently able to consume adequate nutrition by mouth/via tube***.  Weight is stable at *** lbs today.  Encouraged to continue to consume adequate hydration and nutrition, as tolerated.    #. At risk for dysphagia: Given Ms. Klecka's treatment for *** cancer, which included surgery and radiation therapy***, she is at risk for chronic dysphagia.  she reports having difficulty with breads and meats, but is able to consume soft foods and liquids without difficulty.  I encouraged her to continue to perform the swallowing exercises, as directed by Garald Balding, SLP.  If Ms. Hanel requires further swallowing or speech therapy evaluation, I will be happy to place that referral, if needed.  Currently, the patient's reported swallowing concerns are to be expected and stable.    #.  At risk for neck lymphedema:  When patients with head & neck cancers are treated with surgery and/or radiation therapy, there is an associated increased risk of neck lymphedema.  Ms. Josselyn reports that currently she is experiencing what would be considered mild symptoms. she does/does not*** have a neck compression garment and reports wearing/not wearing*** it, as directed.  I encouraged Ms. Printz to continue to wear the compression  garment and practice the massage techniques to reduce the presence of lymphedema.  If her symptoms worsen, I would happy to place a formal referral to physical therapy for further evaluation and treatment.     #.  At risk for hypothyroidism: The thyroid gland is often affected after treatment for head & neck cancer.  Ms. Rebeck most recent TSH was normal at *** on (date).  If appropriate, she will be prescribed thyroid supplement with Levothyroxine. We discussed that she will continue to have serial TSH monitoring for at least the next 5 years as part of her routine follow-up and post-cancer treatment care.   #. At risk for tooth decay/dental concerns: After treatment with radiation for head & neck cancers, patients often experience xerostomia which increases their risk of dental caries.  Ms. Reffner was encouraged to see his dentist 3-4 times per year. The patient should also continue to use her fluoride trays daily, as directed by dentistry.  I encouraged her to reach out to either Dr. Enrique Sack (oncology dentist) or her primary care dentist with additional questions or concerns.   #.  Lung cancer screening:  [facility] now offers eligible patients lung cancer screening with a low-dose chest CT to aid in early detection, provide more effective treatment options, and ultimately improve survival benefits for patients diagnosed early.  Below is the selection criteria for screening:  . Medicare patients: 55-77 years; privately insured patients 55-80 years. . Active or former smokers who have quit within the last 15 years. . 30+ pack-year history of smoking  . Exclusion criteria - No signs/symptoms of lung cancer (i.e., no recent history of hemoptysis and no unexplained weight loss >15 pounds in the last 6 months). . Willing and healthy enough to undergo biopsies/surgery if needed.  Ms. Kenton currently does/does not*** meet criteria for lung cancer screening.  Therefore, I have/have not*** placed a  referral for screening today.    #. Tobacco & alcohol use: Ms. Lunt reports that she quit smoking in *** and continues to abstain from all tobacco products.  I congratulated her continued efforts to remain tobacco free.  I also reinforced the importance of avoiding alcohol consumption as well.  Both tobacco and alcohol use in patients with head & neck cancer increases the risk of recurrence.  They also increase the risk of other cancers, as well.  Ms. Orenstein states that she voiced understanding of the importance of continuing to remain both tobacco and alcohol-free.    #. Health maintenance and wellness promotion: Cancer patients who consume a diet rich in fruits and vegetables have better overall health and decreased risk of cancer recurrence. Ms. Arizpe was encouraged to consume 5-7 servings of fruits and vegetables per day, as tolerated. Ms. Egerer was also encouraged to engage in moderate to vigorous exercise for 30 minutes per day most days of the week.   #. Support services/counseling: It is not uncommon for this period of the patient's cancer care trajectory to be one of many emotions and stressors.  We discussed an opportunity for her to participate in the next session of Head & Neck FYNN ("Finding Your New Normal") support group series, designed for patients after they have completed treatment.   Ms. Goyette was encouraged to take advantage of our many other support services programs, support groups, and/or counseling in coping with her new life as a cancer survivor after completing anti-cancer treatment. The patient was offered support today through active listening and expressive supportive counseling.     Dispo:  -See Dr. Marland Kitchen (ENT) in ***/2017 -Return to cancer center to see Dr. Isidore Moos in ***/2017 -Return to cancer center to see Survivorship NP in ***.      A total of *** minutes was spent in the face-to-face care of this patient, with greater than 50% of that time spent in counseling  and care-coordination.    Mike Craze, NP [place] [phone]

## 2016-04-16 ENCOUNTER — Encounter: Payer: Self-pay | Admitting: Adult Health

## 2016-04-16 ENCOUNTER — Telehealth: Payer: Self-pay | Admitting: *Deleted

## 2016-04-16 NOTE — Telephone Encounter (Signed)
Left message at home # for pt to call office. Missed survivorship visit today.

## 2016-04-24 ENCOUNTER — Telehealth: Payer: Self-pay | Admitting: Adult Health

## 2016-04-24 NOTE — Telephone Encounter (Signed)
Returned call to patient to reschedule 10/26 SCP appointment. LVM to return call to reschedule. Next available 11/6

## 2016-07-08 MED FILL — FLUORISHIELD 1.1% GEL: 1.1 % | 30 days supply | Qty: 114 | Fill #0

## 2016-07-20 IMAGING — PT NM PET TUM IMG RESTAG (PS) SKULL BASE T - THIGH
1 of 8 series · 1 of 25 positions shown · non-contrast
Comparison: None.

CLINICAL DATA: Subsequent treatment strategy for tongue cancer .

EXAM:
NUCLEAR MEDICINE PET SKULL BASE TO THIGH
TECHNIQUE: 10.2 mCi F-18 FDG was injected intravenously. Full-ring PET imaging
was performed from the skull base to thigh after the radiotracer. CT
data was obtained and used for attenuation correction and anatomic
localization.
FASTING BLOOD GLUCOSE:  Value: 97 mg/dl

[Series 3: pet hn_sk_thigh ac · axial · 5.0mm · 4.07mm/px · 1 of 226 slices shown]
[im 113/226]
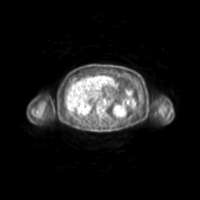

[1 of 25 positions shown; findings below may reference images not displayed]

FINDINGS: NECK

Resolution of intense FDG uptake associated with the base of tongue
mass. SUV max within the base of tongue is equal to 2.6. This is
compared with 13.5 cm previously. Right posterior cervical lymph
node measures 1 cm and has an SUV max equal to 1.8. Previously this
node measured 2 cm and had an SUV max equal to 7.4. Hypermetabolic
nodule within the right lobe of thyroid gland measures approximately
1.3 cm and has an SUV max equal to 7.8.

CHEST

No hypermetabolic mediastinal or hilar nodes. No suspicious
pulmonary nodules on the CT scan.

ABDOMEN/PELVIS

No abnormal hypermetabolic activity within the liver, pancreas,
adrenal glands, or spleen. Stone identified within the gallbladder.
Soft tissue stranding is identified within the mesenteric compatible
with a missed the mesenteric. A few prominent lymph nodes are
identified within this area which appear similar to previous exam.
No definitive evidence for metastatic adenopathy to the abdomen or
pelvis. No hypermetabolic lymph nodes in the abdomen or pelvis.

SKELETON

No focal hypermetabolic activity to suggest skeletal metastasis.
IMPRESSION: 1. Interval response to therapy. Specifically, there has been
resolution of malignant range FDG uptake associated with base of
tongue mass and cervical lymph nodes.
2. Right posterior cervical lymph node remains borderline enlarged
measuring 1 cm. There is no malignant range FDG uptake associated
with this lymph node.
3. Thyroid nodule exhibiting increased FDG uptake. Hypermetabolic
thyroid nodules on PET have up to 40-50% incidence of malignancy;
recommend further evaluation with thyroid ultrasound and possible
US-guided fine needle aspiration.

## 2016-09-02 DIAGNOSIS — E1129 Type 2 diabetes mellitus with other diabetic kidney complication: Secondary | ICD-10-CM | POA: Diagnosis not present

## 2016-09-02 DIAGNOSIS — E039 Hypothyroidism, unspecified: Secondary | ICD-10-CM | POA: Diagnosis not present

## 2016-09-02 DIAGNOSIS — E7800 Pure hypercholesterolemia, unspecified: Secondary | ICD-10-CM | POA: Diagnosis not present

## 2016-09-02 DIAGNOSIS — I10 Essential (primary) hypertension: Secondary | ICD-10-CM | POA: Diagnosis not present

## 2016-09-02 DIAGNOSIS — R809 Proteinuria, unspecified: Secondary | ICD-10-CM | POA: Diagnosis not present

## 2016-09-04 DIAGNOSIS — C770 Secondary and unspecified malignant neoplasm of lymph nodes of head, face and neck: Secondary | ICD-10-CM | POA: Diagnosis not present

## 2016-09-04 DIAGNOSIS — Z85810 Personal history of malignant neoplasm of tongue: Secondary | ICD-10-CM | POA: Diagnosis not present

## 2016-09-28 DIAGNOSIS — L820 Inflamed seborrheic keratosis: Secondary | ICD-10-CM | POA: Diagnosis not present

## 2016-09-28 DIAGNOSIS — D2222 Melanocytic nevi of left ear and external auricular canal: Secondary | ICD-10-CM | POA: Diagnosis not present

## 2016-09-28 DIAGNOSIS — D2239 Melanocytic nevi of other parts of face: Secondary | ICD-10-CM | POA: Diagnosis not present

## 2016-09-28 DIAGNOSIS — Z85828 Personal history of other malignant neoplasm of skin: Secondary | ICD-10-CM | POA: Diagnosis not present

## 2016-10-16 ENCOUNTER — Ambulatory Visit: Payer: BLUE CROSS/BLUE SHIELD | Admitting: Radiation Oncology

## 2016-10-20 ENCOUNTER — Ambulatory Visit
Admission: RE | Admit: 2016-10-20 | Discharge: 2016-10-20 | Disposition: A | Discharge status: Home / Self Care | Payer: BLUE CROSS/BLUE SHIELD | Source: Ambulatory Visit | Attending: Radiation Oncology | Admitting: Radiation Oncology

## 2016-10-20 DIAGNOSIS — C01 Malignant neoplasm of base of tongue: Secondary | ICD-10-CM | POA: Insufficient documentation

## 2016-10-20 DIAGNOSIS — E039 Hypothyroidism, unspecified: Secondary | ICD-10-CM | POA: Insufficient documentation

## 2016-10-20 DIAGNOSIS — Z7982 Long term (current) use of aspirin: Secondary | ICD-10-CM | POA: Insufficient documentation

## 2016-10-20 DIAGNOSIS — Z880 Allergy status to penicillin: Secondary | ICD-10-CM | POA: Insufficient documentation

## 2016-10-20 DIAGNOSIS — Z79899 Other long term (current) drug therapy: Secondary | ICD-10-CM | POA: Insufficient documentation

## 2016-10-21 NOTE — Progress Notes (Signed)
Ms. Gintz presents for follow up of radiation completed 09/24/14 to her Base of Tongue and bilateral neck.    Pain issues, if any: Using a feeding tube?:  Weight changes, if any:  Swallowing issues, if any:  Smoking or chewing tobacco?  Using fluoride trays daily?  Last ENT visit was on: Dr. Erik Obey 09/10/00 Other notable issues, if any:

## 2016-10-23 ENCOUNTER — Inpatient Hospital Stay
Admission: RE | Admit: 2016-10-23 | Discharge: 2016-10-23 | Disposition: A | Discharge status: Home / Self Care | Payer: BLUE CROSS/BLUE SHIELD | Source: Ambulatory Visit | Attending: Radiation Oncology | Admitting: Radiation Oncology

## 2016-11-20 ENCOUNTER — Encounter: Payer: Self-pay | Admitting: Radiation Oncology

## 2016-11-20 ENCOUNTER — Ambulatory Visit
Admission: RE | Admit: 2016-11-20 | Discharge: 2016-11-20 | Disposition: A | Discharge status: Home / Self Care | Payer: BLUE CROSS/BLUE SHIELD | Source: Ambulatory Visit | Attending: Radiation Oncology | Admitting: Radiation Oncology

## 2016-11-20 DIAGNOSIS — E039 Hypothyroidism, unspecified: Secondary | ICD-10-CM | POA: Diagnosis not present

## 2016-11-20 DIAGNOSIS — Z79899 Other long term (current) drug therapy: Secondary | ICD-10-CM | POA: Diagnosis not present

## 2016-11-20 DIAGNOSIS — Z7982 Long term (current) use of aspirin: Secondary | ICD-10-CM | POA: Diagnosis not present

## 2016-11-20 DIAGNOSIS — Z880 Allergy status to penicillin: Secondary | ICD-10-CM | POA: Diagnosis not present

## 2016-11-20 DIAGNOSIS — Z08 Encounter for follow-up examination after completed treatment for malignant neoplasm: Secondary | ICD-10-CM | POA: Diagnosis not present

## 2016-11-20 DIAGNOSIS — C01 Malignant neoplasm of base of tongue: Secondary | ICD-10-CM

## 2016-11-20 DIAGNOSIS — Z85810 Personal history of malignant neoplasm of tongue: Secondary | ICD-10-CM | POA: Diagnosis not present

## 2016-11-20 NOTE — Progress Notes (Signed)
Radiation Oncology         ([phone] ________________________________  Name: [name] MRN: [id number]  Date: 11/20/2016  DOB: 12/08/1955  Follow-Up Visit Note  CC: Alice Arabian, MD  Alice Marble, MD  Diagnosis and Prior Radiotherapy:       ICD-9-CM ICD-10-CM   1. 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 Alice Orem, RN, our Head and Neck Oncology Navigator was present during this encounter. The patient followed up with Dr. Erik Roberts on 7/41/28 with NED on clinical exam. The patient denies pain and does not have a feeding tube. She has gained over 25 lbs in the past years and is concerned about too much weight gain and would like to lose some. She denies difficulty with swallowing or tobacoo use. She is using fluoride trays daily. She is still doing her mouth and neck exercises.   ALLERGIES:  is allergic to adhesive [tape] and penicillins.  Meds: Current Outpatient Prescriptions  Medication Sig Dispense Refill  . ALPRAZolam (XANAX) 0.25 MG tablet Take 0.25 mg by mouth as needed for anxiety.    Marland Kitchen amLODipine (NORVASC) 10 MG tablet Take 5 mg by mouth daily.     Marland Kitchen aspirin 81 MG tablet Take 81 mg by mouth daily.    Marland Kitchen atorvastatin (LIPITOR) 10 MG tablet Take 10 mg by mouth daily.    Marland Kitchen levothyroxine (SYNTHROID, LEVOTHROID) 50 MCG tablet Take 112 mcg by mouth daily before breakfast.    . Multiple Vitamin (MULTIVITAMIN) tablet Take 1 tablet by mouth daily.    . sennosides-docusate sodium (SENOKOT-S) 8.6-50 MG tablet Take 2 tablets by mouth 2 (two) times daily.     . sodium fluoride (FLUORISHIELD) 1.1 % GEL dental gel Instill one drop of gel per tooth space of fluoride tray. Place over teeth for 5 minutes. Remove. Spit out excess. Repeat nightly. 120 mL prn   No current facility-administered medications for this encounter.     Physical Findings: The patient is in no acute distress. Patient is alert and oriented. Wt Readings from Last 3 Encounters:  11/20/16 201 lb 6.4 oz (91.4 kg)  10/16/15 174 lb 6.4 oz (79.1 kg)  05/10/15 160 lb 4.8 oz (72.7 kg)    height is 5' 7.5" (1.715 m) and weight is 201 lb 6.4 oz (91.4 kg). Her temperature is 97.8 F (36.6 C). Her blood pressure is 165/79 (abnormal) and her pulse is 66. Her oxygen saturation is 100%.  Oral cavity and oropharynx is clear with no lesions. No anterior neck lymphedema. No palpable cervical or supraclavicular adenopathy. Lungs are clear to auscultation bilaterally. Heart has regular rate and rhythm. Abdomen soft, non-tender, normal bowel sounds. No lower extremity edema. Psych: pleasant demeanor, affect is appropriate.  Ambulatory.  Neuro: No focal neurologic findings.  Lab Findings: Lab Results  Component Value Date   WBC 4.8 09/06/2014   HGB 12.0 09/06/2014   HCT 36.2 09/06/2014   MCV 93.8 09/06/2014  PLT 188 09/06/2014    Lab Results  Component Value Date   TSH 39.818 (H) 01/24/2015    Radiographic Findings: No results found.  Impression/Plan: NED. Will continue following with physical exams. She is very likely cured.   Thyroid - taking  Levothyroxine via her PCP, Dr. Gaynelle Roberts, for preexisting hypothyroidism. Labs per PCP are up to date in another system, per patient. Lab Results  Component Value Date   TSH 39.818 (H) 01/24/2015   Dental followup- compliant  Tobacco - abstaining  Continuing SLP exercises.  No swallowing issues. The patient reports too much weight gain in the past year with 50 lbs since August 2016.  I advised her to speak with her PCP about  continued checking her TSH and possibly titrate her Levothyroxine. She states she is up to date on this.  She will look into the [facility] program to start exercising more.  Followup plans: The patient is over 2 years out from therapy and can start follow ups 6 months apart. She can follow up with Dr. Erik Roberts in December 5681 and I would like her to follow up with Dr. Alvy Roberts in June 2019. The patient could follow up with me on a PRN basis. She could follow up or call if she has any questions or concerns. I encouraged the patient to participate in [place].  _____________________________________   Alice Gibson, MD  This document serves as a record of services personally performed by Alice Gibson, MD. It was created on her behalf by [name], a trained medical scribe. The creation of this record is based on the scribe's personal observations and the provider's statements to them. This document has been checked and approved by the attending provider.

## 2016-11-20 NOTE — Progress Notes (Signed)
Ms. Forry presents for follow up of radiation completed 09/24/14 to her Base of Tongue and bilateral neck.    Pain issues, if any: She denies Using a feeding tube?: N/A Weight changes, if any: She has gained weight over the last year, and is concerned about this and would like to try to lose weight.  Wt Readings from Last 3 Encounters:  11/20/16 201 lb 6.4 oz (91.4 kg)  10/16/15 174 lb 6.4 oz (79.1 kg)  05/10/15 160 lb 4.8 oz (72.7 kg)   Swallowing issues, if any: She denies difficulty swallowing.  Smoking or chewing tobacco? No Using fluoride trays daily? Yes, daily  Last ENT visit was on: Dr. Erik Obey 10/03/62 Other notable issues, if any:   BP (!) 165/79   Pulse 66   Temp 97.8 F (36.6 C)   Ht 5' 7.5" (1.715 m)   Wt 201 lb 6.4 oz (91.4 kg)   SpO2 100% Comment: room air  BMI 31.08 kg/m

## 2016-11-23 DIAGNOSIS — D2222 Melanocytic nevi of left ear and external auricular canal: Secondary | ICD-10-CM | POA: Diagnosis not present

## 2016-11-23 DIAGNOSIS — L821 Other seborrheic keratosis: Secondary | ICD-10-CM | POA: Diagnosis not present

## 2016-11-23 DIAGNOSIS — L814 Other melanin hyperpigmentation: Secondary | ICD-10-CM | POA: Diagnosis not present

## 2016-11-23 DIAGNOSIS — Z85828 Personal history of other malignant neoplasm of skin: Secondary | ICD-10-CM | POA: Diagnosis not present

## 2016-11-25 ENCOUNTER — Telehealth: Payer: Self-pay | Admitting: *Deleted

## 2016-11-25 NOTE — Telephone Encounter (Signed)
Dr Pearlie Oyster nurse called to say Dr Isidore Moos would like Dr Alvy Bimler to see Lashe Oliveira in 1 year

## 2016-12-01 ENCOUNTER — Telehealth: Payer: Self-pay | Admitting: Hematology and Oncology

## 2016-12-01 NOTE — Telephone Encounter (Signed)
sw pt to confirm 11/26/17 appt per sch msg

## 2016-12-25 ENCOUNTER — Ambulatory Visit: Payer: Self-pay | Admitting: Hematology and Oncology

## 2017-04-12 DIAGNOSIS — E7800 Pure hypercholesterolemia, unspecified: Secondary | ICD-10-CM | POA: Diagnosis not present

## 2017-04-12 DIAGNOSIS — I10 Essential (primary) hypertension: Secondary | ICD-10-CM | POA: Diagnosis not present

## 2017-04-12 DIAGNOSIS — E039 Hypothyroidism, unspecified: Secondary | ICD-10-CM | POA: Diagnosis not present

## 2017-04-12 DIAGNOSIS — E1129 Type 2 diabetes mellitus with other diabetic kidney complication: Secondary | ICD-10-CM | POA: Diagnosis not present

## 2017-04-12 DIAGNOSIS — R809 Proteinuria, unspecified: Secondary | ICD-10-CM | POA: Diagnosis not present

## 2017-04-12 DIAGNOSIS — Z23 Encounter for immunization: Secondary | ICD-10-CM | POA: Diagnosis not present

## 2017-07-27 DIAGNOSIS — Z85810 Personal history of malignant neoplasm of tongue: Secondary | ICD-10-CM | POA: Diagnosis not present

## 2017-07-27 DIAGNOSIS — C770 Secondary and unspecified malignant neoplasm of lymph nodes of head, face and neck: Secondary | ICD-10-CM | POA: Diagnosis not present

## 2017-09-23 DIAGNOSIS — Z01419 Encounter for gynecological examination (general) (routine) without abnormal findings: Secondary | ICD-10-CM | POA: Diagnosis not present

## 2017-09-23 DIAGNOSIS — Z1231 Encounter for screening mammogram for malignant neoplasm of breast: Secondary | ICD-10-CM | POA: Diagnosis not present

## 2017-09-23 DIAGNOSIS — Z6832 Body mass index (BMI) 32.0-32.9, adult: Secondary | ICD-10-CM | POA: Diagnosis not present

## 2017-11-05 DIAGNOSIS — E1129 Type 2 diabetes mellitus with other diabetic kidney complication: Secondary | ICD-10-CM | POA: Diagnosis not present

## 2017-11-05 DIAGNOSIS — E7800 Pure hypercholesterolemia, unspecified: Secondary | ICD-10-CM | POA: Diagnosis not present

## 2017-11-05 DIAGNOSIS — I10 Essential (primary) hypertension: Secondary | ICD-10-CM | POA: Diagnosis not present

## 2017-11-05 DIAGNOSIS — R809 Proteinuria, unspecified: Secondary | ICD-10-CM | POA: Diagnosis not present

## 2017-11-05 DIAGNOSIS — E039 Hypothyroidism, unspecified: Secondary | ICD-10-CM | POA: Diagnosis not present

## 2017-11-26 ENCOUNTER — Inpatient Hospital Stay: Payer: BLUE CROSS/BLUE SHIELD | Attending: Hematology and Oncology | Admitting: Hematology and Oncology

## 2017-11-26 ENCOUNTER — Telehealth: Payer: Self-pay | Admitting: Hematology and Oncology

## 2017-11-26 ENCOUNTER — Encounter: Payer: Self-pay | Admitting: Hematology and Oncology

## 2017-11-26 DIAGNOSIS — Z79899 Other long term (current) drug therapy: Secondary | ICD-10-CM | POA: Diagnosis not present

## 2017-11-26 DIAGNOSIS — C01 Malignant neoplasm of base of tongue: Secondary | ICD-10-CM | POA: Diagnosis not present

## 2017-11-26 DIAGNOSIS — E039 Hypothyroidism, unspecified: Secondary | ICD-10-CM | POA: Diagnosis not present

## 2017-11-26 DIAGNOSIS — Z923 Personal history of irradiation: Secondary | ICD-10-CM | POA: Insufficient documentation

## 2017-11-26 NOTE — Progress Notes (Signed)
[name] OFF[facility] PROGRESS NOTE  Patient Care Team: Gaynelle Arabian, MD as PCP - General (Family Medicine) Leota Sauers, RN as Oncology Nurse Navigator Karie Mainland, RD as Dietitian (Nutrition) Eppie Gibson, MD as Attending Physician (Radiation Oncology)  ASSESSMENT & PLAN:  Cancer of base of tongue Clinically, she has no signs or symptoms of cancer recurrence I reinforced the importance of close follow-up with ENT surgeon We also discussed the importance of dental health We discussed risk factors for cancer recurrence I plan to see her back once a year for further follow-up  Acquired hypothyroidism The patient has acquired hypothyroidism and is currently taking thyroid supplement Her primary care doctor is managing her thyroid medications I would defer to him for further management   No orders of the defined types were placed in this encounter.   INTERVAL HISTORY: Please see below for problem oriented charting. She returns for further follow-up She is doing well She denies swallowing difficulties No new abnormalities in the neck She is compliant seeing ENT surgeon for evaluation on a regular basis She had some dental issues but nothing significant She is up-to-date with close follow-up with her dentist She has full recovery of her taste sensation She denies recent tobacco use or alcohol intake  SUMMARY OF ONCOLOGIC HISTORY: Oncology History   Cancer of base of tongue   Staging form: Lip and Oral Cavity, AJCC 7th Edition     Clinical: Stage IVA (T3, N2b, M0) - Signed by Heath Lark, MD on 05/09/2014       Cancer of base of tongue ([place])   04/20/2014 Imaging    CT neck showed advanced base of tongue mass and multiple right neck lymphadenopathy      04/23/2014 Pathology Results    [id number] FNA of right neck is positive for squamous cell cancer, P16 positive      05/09/2014 Imaging    PET/CT scan confirmed base of tongue cancer along with  lymph node metastasis.      05/10/2014 Procedure    She has placement of PICC line.      05/11/2014 - 06/26/2014 Chemotherapy    She received 3 cycles of induction chemotherapy with Taxotere, cisplatin and 5-FU.      05/19/2014 - 05/22/2014 Hospital Admission    She was admitted to the hospital for management of neutropenic fever, GI bleed requiring blood transfusion and diarrhea from chemotherapy      07/19/2014 Imaging    Repeat PET/CT scan showed complete response to treatment      08/07/2014 - 09/24/2014 Radiation Therapy    Rec'd RT to base of tongue and bilateral neck:  70 Gy in 35 fractions to gross disease, 63 Gy in 35 fractions to high risk nodal echelons, 56 Gy in 35 fractons to intermediate risk nodal echelons.      10/05/2014 Procedure    PICC removed.      01/24/2015 PET scan    No definite evidence of recurrent or metastatic disease.       REVIEW OF SYSTEMS:   Constitutional: Denies fevers, chills or abnormal weight loss Eyes: Denies blurriness of vision Ears, nose, mouth, throat, and face: Denies mucositis or sore throat Respiratory: Denies cough, dyspnea or wheezes Cardiovascular: Denies palpitation, chest discomfort or lower extremity swelling Gastrointestinal:  Denies nausea, heartburn or change in bowel habits Skin: Denies abnormal skin rashes Lymphatics: Denies new lymphadenopathy or easy bruising Neurological:Denies numbness, tingling or new weaknesses Behavioral/Psych: Mood is stable, no new changes  All other systems were reviewed with the patient and are negative.  I have reviewed the past medical history, past surgical history, social history and family history with the patient and they are unchanged from previous note.  ALLERGIES:  is allergic to adhesive [tape] and penicillins.  MEDICATIONS:  Current Outpatient Medications  Medication Sig Dispense Refill  . ALPRAZolam (XANAX) 0.25 MG tablet Take 0.25 mg by mouth as needed for anxiety.    Marland Kitchen  amLODipine (NORVASC) 10 MG tablet Take 5 mg by mouth daily.     Marland Kitchen aspirin 81 MG tablet Take 81 mg by mouth daily.    Marland Kitchen atorvastatin (LIPITOR) 10 MG tablet Take 10 mg by mouth daily.    Marland Kitchen levothyroxine (SYNTHROID, LEVOTHROID) 50 MCG tablet Take 112 mcg by mouth daily before breakfast.    . Multiple Vitamin (MULTIVITAMIN) tablet Take 1 tablet by mouth daily.    . sennosides-docusate sodium (SENOKOT-S) 8.6-50 MG tablet Take 2 tablets by mouth 2 (two) times daily.    . sodium fluoride (FLUORISHIELD) 1.1 % GEL dental gel Instill one drop of gel per tooth space of fluoride tray. Place over teeth for 5 minutes. Remove. Spit out excess. Repeat nightly. 120 mL prn   No current facility-administered medications for this visit.     PHYSICAL EXAMINATION: ECOG PERFORMANCE STATUS: 0 - Asymptomatic  Vitals:   11/26/17 0835  BP: (!) 134/59  Pulse: 72  Resp: 18  Temp: 97.9 F (36.6 C)  SpO2: 100%   Filed Weights   11/26/17 0835  Weight: 209 lb 4.8 oz (94.9 kg)    GENERAL:alert, no distress and comfortable SKIN: skin color, texture, turgor are normal, no rashes or significant lesions EYES: normal, Conjunctiva are pink and non-injected, sclera clear OROPHARYNX:no exudate, no erythema and lips, buccal mucosa, and tongue normal  NECK: supple, thyroid normal size, non-tender, without nodularity LYMPH:  no palpable lymphadenopathy in the cervical, axillary or inguinal LUNGS: clear to auscultation and percussion with normal breathing effort HEART: regular rate & rhythm and no murmurs and no lower extremity edema ABDOMEN:abdomen soft, non-tender and normal bowel sounds Musculoskeletal:no cyanosis of digits and no clubbing  NEURO: alert & oriented x 3 with fluent speech, no focal motor/sensory deficits  LABORATORY DATA:  I have reviewed the data as listed    Component Value Date/Time   NA 138 09/06/2014 0910   K 4.1 09/06/2014 0910   CL 101 05/22/2014 0445   CO2 27 09/06/2014 0910   GLUCOSE 124  09/06/2014 0910   BUN 9.3 09/06/2014 0910   CREATININE 0.7 09/06/2014 0910   CALCIUM 9.6 09/06/2014 0910   PROT 7.6 09/06/2014 0910   ALBUMIN 3.8 09/06/2014 0910   AST 17 09/06/2014 0910   ALT 20 09/06/2014 0910   ALKPHOS 83 09/06/2014 0910   BILITOT 0.34 09/06/2014 0910   GFRNONAA >90 05/22/2014 0445   GFRAA >90 05/22/2014 0445    No results found for: SPEP, UPEP  Lab Results  Component Value Date   WBC 4.8 09/06/2014   NEUTROABS 3.8 09/06/2014   HGB 12.0 09/06/2014   HCT 36.2 09/06/2014   MCV 93.8 09/06/2014   PLT 188 09/06/2014      Chemistry      Component Value Date/Time   NA 138 09/06/2014 0910   K 4.1 09/06/2014 0910   CL 101 05/22/2014 0445   CO2 27 09/06/2014 0910   BUN 9.3 09/06/2014 0910   CREATININE 0.7 09/06/2014 0910      Component Value Date/Time  CALCIUM 9.6 09/06/2014 0910   ALKPHOS 83 09/06/2014 0910   AST 17 09/06/2014 0910   ALT 20 09/06/2014 0910   BILITOT 0.34 09/06/2014 0910      All questions were answered. The patient knows to call the clinic with any problems, questions or concerns. No barriers to learning was detected.  I spent 10 minutes counseling the patient face to face. The total time spent in the appointment was 15 minutes and more than 50% was on counseling and review of test results  Heath Lark, MD 11/26/2017 10:33 AM

## 2017-11-26 NOTE — Assessment & Plan Note (Signed)
Clinically, she has no signs or symptoms of cancer recurrence I reinforced the importance of close follow-up with ENT surgeon We also discussed the importance of dental health We discussed risk factors for cancer recurrence I plan to see her back once a year for further follow-up 

## 2017-11-26 NOTE — Assessment & Plan Note (Signed)
The patient has acquired hypothyroidism and is currently taking thyroid supplement Her primary care doctor is managing her thyroid medications I would defer to him for further management

## 2017-11-26 NOTE — Telephone Encounter (Signed)
Gave avs and calendar ° °

## 2018-03-22 DIAGNOSIS — Z23 Encounter for immunization: Secondary | ICD-10-CM | POA: Diagnosis not present

## 2018-05-13 DIAGNOSIS — E1129 Type 2 diabetes mellitus with other diabetic kidney complication: Secondary | ICD-10-CM | POA: Diagnosis not present

## 2018-05-13 DIAGNOSIS — I10 Essential (primary) hypertension: Secondary | ICD-10-CM | POA: Diagnosis not present

## 2018-05-13 DIAGNOSIS — E7800 Pure hypercholesterolemia, unspecified: Secondary | ICD-10-CM | POA: Diagnosis not present

## 2018-05-13 DIAGNOSIS — R809 Proteinuria, unspecified: Secondary | ICD-10-CM | POA: Diagnosis not present

## 2018-05-30 DIAGNOSIS — Z23 Encounter for immunization: Secondary | ICD-10-CM | POA: Diagnosis not present

## 2018-07-01 DIAGNOSIS — Z85828 Personal history of other malignant neoplasm of skin: Secondary | ICD-10-CM | POA: Diagnosis not present

## 2018-07-01 DIAGNOSIS — D2239 Melanocytic nevi of other parts of face: Secondary | ICD-10-CM | POA: Diagnosis not present

## 2018-07-01 DIAGNOSIS — D2222 Melanocytic nevi of left ear and external auricular canal: Secondary | ICD-10-CM | POA: Diagnosis not present

## 2018-07-01 DIAGNOSIS — D1801 Hemangioma of skin and subcutaneous tissue: Secondary | ICD-10-CM | POA: Diagnosis not present

## 2018-08-01 DIAGNOSIS — Z23 Encounter for immunization: Secondary | ICD-10-CM | POA: Diagnosis not present

## 2018-11-21 DIAGNOSIS — R809 Proteinuria, unspecified: Secondary | ICD-10-CM | POA: Diagnosis not present

## 2018-11-21 DIAGNOSIS — E7800 Pure hypercholesterolemia, unspecified: Secondary | ICD-10-CM | POA: Diagnosis not present

## 2018-11-21 DIAGNOSIS — E1129 Type 2 diabetes mellitus with other diabetic kidney complication: Secondary | ICD-10-CM | POA: Diagnosis not present

## 2018-11-21 DIAGNOSIS — I10 Essential (primary) hypertension: Secondary | ICD-10-CM | POA: Diagnosis not present

## 2018-11-28 ENCOUNTER — Encounter: Payer: Self-pay | Admitting: Hematology and Oncology

## 2018-11-28 ENCOUNTER — Inpatient Hospital Stay: Payer: BC Managed Care – PPO | Attending: Hematology and Oncology | Admitting: Hematology and Oncology

## 2018-11-28 ENCOUNTER — Other Ambulatory Visit: Payer: Self-pay

## 2018-11-28 DIAGNOSIS — E6609 Other obesity due to excess calories: Secondary | ICD-10-CM | POA: Insufficient documentation

## 2018-11-28 DIAGNOSIS — Z9221 Personal history of antineoplastic chemotherapy: Secondary | ICD-10-CM | POA: Insufficient documentation

## 2018-11-28 DIAGNOSIS — Z923 Personal history of irradiation: Secondary | ICD-10-CM | POA: Diagnosis not present

## 2018-11-28 DIAGNOSIS — Z880 Allergy status to penicillin: Secondary | ICD-10-CM

## 2018-11-28 DIAGNOSIS — Z79899 Other long term (current) drug therapy: Secondary | ICD-10-CM | POA: Insufficient documentation

## 2018-11-28 DIAGNOSIS — R682 Dry mouth, unspecified: Secondary | ICD-10-CM | POA: Diagnosis not present

## 2018-11-28 DIAGNOSIS — Z888 Allergy status to other drugs, medicaments and biological substances status: Secondary | ICD-10-CM | POA: Insufficient documentation

## 2018-11-28 DIAGNOSIS — C01 Malignant neoplasm of base of tongue: Secondary | ICD-10-CM | POA: Diagnosis not present

## 2018-11-28 DIAGNOSIS — E039 Hypothyroidism, unspecified: Secondary | ICD-10-CM | POA: Insufficient documentation

## 2018-11-28 NOTE — Assessment & Plan Note (Signed)
The patient has acquired hypothyroidism and is currently taking thyroid supplement Her primary care doctor is managing her thyroid medications 

## 2018-11-28 NOTE — Assessment & Plan Note (Signed)
We have extensive discussion about preventive care, the importance of dietary modification and weight loss.

## 2018-11-28 NOTE — Progress Notes (Signed)
[facility] OFFICE PROGRESS NOTE  Patient Care Team: Gaynelle Arabian, MD as PCP - General (Family Medicine) Leota Sauers, RN as Oncology Nurse Navigator Karie Mainland, RD as Dietitian (Nutrition) Eppie Gibson, MD as Attending Physician (Radiation Oncology)  ASSESSMENT & PLAN:  Cancer of base of tongue Clinically, she has no signs or symptoms of cancer recurrence I reinforced the importance of close follow-up with ENT surgeon We also discussed the importance of dental health We discussed risk factors for cancer recurrence I plan to see her back once a year for further follow-up  Acquired hypothyroidism The patient has acquired hypothyroidism and is currently taking thyroid supplement Her primary care doctor is managing her thyroid medications  Obesity due to excess calories We have extensive discussion about preventive care, the importance of dietary modification and weight loss.   No orders of the defined types were placed in this encounter.   INTERVAL HISTORY: Please see below for problem oriented charting. She returns for further follow-up She denies new lesions in her mouth No recent dental issues She have chronic dry mouth, stable with frequent sips of water Denies new lumps in her neck She admits seeing her ENT physician recently due to pandemic restrictions No recent fever, chills or cough Denies recent smoking or alcohol intake She has gained some weight due to lack of mobility and poor dietary choices  SUMMARY OF ONCOLOGIC HISTORY: Oncology History   Cancer of base of tongue   Staging form: Lip and Oral Cavity, AJCC 7th Edition     Clinical: Stage IVA (T3, N2b, M0) - Signed by Heath Lark, MD on 05/09/2014       Cancer of base of tongue ([place])   04/20/2014 Imaging    CT neck showed advanced base of tongue mass and multiple right neck lymphadenopathy    04/23/2014 Pathology Results    [id number] FNA of right neck is positive for squamous  cell cancer, P16 positive    05/09/2014 Imaging    PET/CT scan confirmed base of tongue cancer along with lymph node metastasis.    05/10/2014 Procedure    She has placement of PICC line.    05/11/2014 - 06/26/2014 Chemotherapy    She received 3 cycles of induction chemotherapy with Taxotere, cisplatin and 5-FU.    05/19/2014 - 05/22/2014 Hospital Admission    She was admitted to the hospital for management of neutropenic fever, GI bleed requiring blood transfusion and diarrhea from chemotherapy    07/19/2014 Imaging    Repeat PET/CT scan showed complete response to treatment    08/07/2014 - 09/24/2014 Radiation Therapy    Rec'd RT to base of tongue and bilateral neck:  70 Gy in 35 fractions to gross disease, 63 Gy in 35 fractions to high risk nodal echelons, 56 Gy in 35 fractons to intermediate risk nodal echelons.    10/05/2014 Procedure    PICC removed.    01/24/2015 PET scan    No definite evidence of recurrent or metastatic disease.     REVIEW OF SYSTEMS:   Constitutional: Denies fevers, chills or abnormal weight loss Eyes: Denies blurriness of vision Ears, nose, mouth, throat, and face: Denies mucositis or sore throat Respiratory: Denies cough, dyspnea or wheezes Cardiovascular: Denies palpitation, chest discomfort or lower extremity swelling Gastrointestinal:  Denies nausea, heartburn or change in bowel habits Skin: Denies abnormal skin rashes Lymphatics: Denies new lymphadenopathy or easy bruising Neurological:Denies numbness, tingling or new weaknesses Behavioral/Psych: Mood is stable, no new changes  All  other systems were reviewed with the patient and are negative.  I have reviewed the past medical history, past surgical history, social history and family history with the patient and they are unchanged from previous note.  ALLERGIES:  is allergic to adhesive [tape] and penicillins.  MEDICATIONS:  Current Outpatient Medications  Medication Sig Dispense Refill  .  levothyroxine (SYNTHROID) 112 MCG tablet Take 112 mcg by mouth daily before breakfast.    . ALPRAZolam (XANAX) 0.25 MG tablet Take 0.25 mg by mouth as needed for anxiety.    Marland Kitchen amLODipine (NORVASC) 10 MG tablet Take 5 mg by mouth daily.     Marland Kitchen aspirin 81 MG tablet Take 81 mg by mouth daily.    Marland Kitchen atorvastatin (LIPITOR) 10 MG tablet Take 10 mg by mouth daily.    . Multiple Vitamin (MULTIVITAMIN) tablet Take 1 tablet by mouth daily.    . sennosides-docusate sodium (SENOKOT-S) 8.6-50 MG tablet Take 2 tablets by mouth 2 (two) times daily.    . sodium fluoride (FLUORISHIELD) 1.1 % GEL dental gel Instill one drop of gel per tooth space of fluoride tray. Place over teeth for 5 minutes. Remove. Spit out excess. Repeat nightly. 120 mL prn   No current facility-administered medications for this visit.     PHYSICAL EXAMINATION: ECOG PERFORMANCE STATUS: 1 - Symptomatic but completely ambulatory  Vitals:   11/28/18 0848  BP: 137/73  Pulse: 74  Resp: 18  Temp: 98.3 F (36.8 C)  SpO2: 100%   Filed Weights   11/28/18 0848  Weight: 216 lb 12.8 oz (98.3 kg)    GENERAL:alert, no distress and comfortable SKIN: skin color, texture, turgor are normal, no rashes or significant lesions EYES: normal, Conjunctiva are pink and non-injected, sclera clear OROPHARYNX:no exudate, no erythema and lips, buccal mucosa, and tongue normal  NECK: supple, thyroid normal size, non-tender, without nodularity LYMPH:  no palpable lymphadenopathy in the cervical, axillary or inguinal LUNGS: clear to auscultation and percussion with normal breathing effort HEART: regular rate & rhythm and no murmurs and no lower extremity edema ABDOMEN:abdomen soft, non-tender and normal bowel sounds Musculoskeletal:no cyanosis of digits and no clubbing  NEURO: alert & oriented x 3 with fluent speech, no focal motor/sensory deficits  LABORATORY DATA:  I have reviewed the data as listed    Component Value Date/Time   NA 138 09/06/2014  0910   K 4.1 09/06/2014 0910   CL 101 05/22/2014 0445   CO2 27 09/06/2014 0910   GLUCOSE 124 09/06/2014 0910   BUN 9.3 09/06/2014 0910   CREATININE 0.7 09/06/2014 0910   CALCIUM 9.6 09/06/2014 0910   PROT 7.6 09/06/2014 0910   ALBUMIN 3.8 09/06/2014 0910   AST 17 09/06/2014 0910   ALT 20 09/06/2014 0910   ALKPHOS 83 09/06/2014 0910   BILITOT 0.34 09/06/2014 0910   GFRNONAA >90 05/22/2014 0445   GFRAA >90 05/22/2014 0445    No results found for: SPEP, UPEP  Lab Results  Component Value Date   WBC 4.8 09/06/2014   NEUTROABS 3.8 09/06/2014   HGB 12.0 09/06/2014   HCT 36.2 09/06/2014   MCV 93.8 09/06/2014   PLT 188 09/06/2014      Chemistry      Component Value Date/Time   NA 138 09/06/2014 0910   K 4.1 09/06/2014 0910   CL 101 05/22/2014 0445   CO2 27 09/06/2014 0910   BUN 9.3 09/06/2014 0910   CREATININE 0.7 09/06/2014 0910      Component Value Date/Time  CALCIUM 9.6 09/06/2014 0910   ALKPHOS 83 09/06/2014 0910   AST 17 09/06/2014 0910   ALT 20 09/06/2014 0910   BILITOT 0.34 09/06/2014 0910      All questions were answered. The patient knows to call the clinic with any problems, questions or concerns. No barriers to learning was detected.  I spent 15 minutes counseling the patient face to face. The total time spent in the appointment was 20 minutes and more than 50% was on counseling and review of test results  Heath Lark, MD 11/28/2018 11:31 AM

## 2018-11-28 NOTE — Assessment & Plan Note (Signed)
Clinically, she has no signs or symptoms of cancer recurrence I reinforced the importance of close follow-up with ENT surgeon We also discussed the importance of dental health We discussed risk factors for cancer recurrence I plan to see her back once a year for further follow-up

## 2018-11-30 DIAGNOSIS — Z1231 Encounter for screening mammogram for malignant neoplasm of breast: Secondary | ICD-10-CM | POA: Diagnosis not present

## 2018-11-30 DIAGNOSIS — Z6834 Body mass index (BMI) 34.0-34.9, adult: Secondary | ICD-10-CM | POA: Diagnosis not present

## 2018-11-30 DIAGNOSIS — Z01419 Encounter for gynecological examination (general) (routine) without abnormal findings: Secondary | ICD-10-CM | POA: Diagnosis not present

## 2018-12-28 DIAGNOSIS — E039 Hypothyroidism, unspecified: Secondary | ICD-10-CM | POA: Diagnosis not present

## 2018-12-28 DIAGNOSIS — I10 Essential (primary) hypertension: Secondary | ICD-10-CM | POA: Diagnosis not present

## 2018-12-28 DIAGNOSIS — Z8249 Family history of ischemic heart disease and other diseases of the circulatory system: Secondary | ICD-10-CM | POA: Diagnosis not present

## 2018-12-28 DIAGNOSIS — E1129 Type 2 diabetes mellitus with other diabetic kidney complication: Secondary | ICD-10-CM | POA: Diagnosis not present

## 2018-12-28 DIAGNOSIS — E7800 Pure hypercholesterolemia, unspecified: Secondary | ICD-10-CM | POA: Diagnosis not present

## 2019-04-15 DIAGNOSIS — Z23 Encounter for immunization: Secondary | ICD-10-CM | POA: Diagnosis not present

## 2019-04-18 DIAGNOSIS — L820 Inflamed seborrheic keratosis: Secondary | ICD-10-CM | POA: Diagnosis not present

## 2019-04-18 DIAGNOSIS — L570 Actinic keratosis: Secondary | ICD-10-CM | POA: Diagnosis not present

## 2019-05-23 DIAGNOSIS — E7800 Pure hypercholesterolemia, unspecified: Secondary | ICD-10-CM | POA: Diagnosis not present

## 2019-05-23 DIAGNOSIS — R809 Proteinuria, unspecified: Secondary | ICD-10-CM | POA: Diagnosis not present

## 2019-05-23 DIAGNOSIS — I10 Essential (primary) hypertension: Secondary | ICD-10-CM | POA: Diagnosis not present

## 2019-05-23 DIAGNOSIS — E1129 Type 2 diabetes mellitus with other diabetic kidney complication: Secondary | ICD-10-CM | POA: Diagnosis not present

## 2019-09-01 DIAGNOSIS — L821 Other seborrheic keratosis: Secondary | ICD-10-CM | POA: Diagnosis not present

## 2019-09-01 DIAGNOSIS — L820 Inflamed seborrheic keratosis: Secondary | ICD-10-CM | POA: Diagnosis not present

## 2019-09-01 DIAGNOSIS — D485 Neoplasm of uncertain behavior of skin: Secondary | ICD-10-CM | POA: Diagnosis not present

## 2019-09-01 DIAGNOSIS — D2239 Melanocytic nevi of other parts of face: Secondary | ICD-10-CM | POA: Diagnosis not present

## 2019-09-01 DIAGNOSIS — L738 Other specified follicular disorders: Secondary | ICD-10-CM | POA: Diagnosis not present

## 2019-09-01 DIAGNOSIS — Z85828 Personal history of other malignant neoplasm of skin: Secondary | ICD-10-CM | POA: Diagnosis not present

## 2019-09-01 DIAGNOSIS — L570 Actinic keratosis: Secondary | ICD-10-CM | POA: Diagnosis not present

## 2019-11-24 ENCOUNTER — Telehealth: Payer: Self-pay | Admitting: Hematology and Oncology

## 2019-11-24 NOTE — Telephone Encounter (Signed)
Called pt per 6/4 sch message - unable to reach pt -left message for pt to call back to reschedule appt

## 2019-11-28 ENCOUNTER — Inpatient Hospital Stay: Payer: BC Managed Care – PPO | Admitting: Hematology and Oncology

## 2019-12-08 ENCOUNTER — Inpatient Hospital Stay: Payer: BC Managed Care – PPO | Attending: Hematology and Oncology | Admitting: Hematology and Oncology

## 2019-12-08 ENCOUNTER — Encounter: Payer: Self-pay | Admitting: Hematology and Oncology

## 2019-12-08 ENCOUNTER — Other Ambulatory Visit: Payer: Self-pay

## 2019-12-08 DIAGNOSIS — C01 Malignant neoplasm of base of tongue: Secondary | ICD-10-CM | POA: Diagnosis not present

## 2019-12-08 DIAGNOSIS — E039 Hypothyroidism, unspecified: Secondary | ICD-10-CM | POA: Diagnosis not present

## 2019-12-08 DIAGNOSIS — Z85810 Personal history of malignant neoplasm of tongue: Secondary | ICD-10-CM | POA: Insufficient documentation

## 2019-12-08 DIAGNOSIS — Z923 Personal history of irradiation: Secondary | ICD-10-CM | POA: Insufficient documentation

## 2019-12-08 DIAGNOSIS — Z9221 Personal history of antineoplastic chemotherapy: Secondary | ICD-10-CM | POA: Diagnosis not present

## 2019-12-08 NOTE — Assessment & Plan Note (Signed)
The patient has acquired hypothyroidism and is currently taking thyroid supplement Her primary care doctor is managing her thyroid medications

## 2019-12-08 NOTE — Assessment & Plan Note (Addendum)
Clinically, she has no signs or symptoms of cancer recurrence I reinforced the importance of close follow-up with ENT surgeon We also discussed the importance of dental health We discussed risk factors for cancer recurrence She is currently more than 5 years out from treatment I would discontinue follow-up at the cancer center

## 2019-12-08 NOTE — Progress Notes (Signed)
[place] OFFICE PROGRESS NOTE  Patient Care Team: Gaynelle Arabian, MD as PCP - General (Family Medicine) Leota Sauers, RN (Inactive) as Oncology Nurse Navigator Karie Mainland, RD as Dietitian (Nutrition) Eppie Gibson, MD as Attending Physician (Radiation Oncology)  ASSESSMENT & PLAN:  Cancer of base of tongue Clinically, she has no signs or symptoms of cancer recurrence I reinforced the importance of close follow-up with ENT surgeon We also discussed the importance of dental health We discussed risk factors for cancer recurrence She is currently more than 5 years out from treatment I would discontinue follow-up at the cancer center  Acquired hypothyroidism The patient has acquired hypothyroidism and is currently taking thyroid supplement Her primary care doctor is managing her thyroid medications   No orders of the defined types were placed in this encounter.   All questions were answered. The patient knows to call the clinic with any problems, questions or concerns. The total time spent in the appointment was 15 minutes encounter with patients including review of chart and various tests results, discussions about plan of care and coordination of care plan   Heath Lark, MD 12/08/2019 2:11 PM  INTERVAL HISTORY: Please see below for problem oriented charting. She returns for further follow-up She is doing well No recent abnormal oral lesions No new neck lymphadenopathy She denies smoking She has occasional very rare alcohol intake She follows with her primary care doctor for thyroid medicine refill and TSH monitoring  SUMMARY OF ONCOLOGIC HISTORY: Oncology History Overview Note  Cancer of base of tongue   Staging form: Lip and Oral Cavity, AJCC 7th Edition     Clinical: Stage IVA (T3, N2b, M0) - Signed by Heath Lark, MD on 05/09/2014     Can[place] of base of tongue ([place])  04/20/2014 Imaging   CT neck showed advanced base of tongue mass and  multiple right neck lymphadenopathy   04/23/2014 Pathology Results   [id number] FNA of right neck is positive for squamous cell cancer, P16 positive   05/09/2014 Imaging   PET/CT scan confirmed base of tongue cancer along with lymph node metastasis.   05/10/2014 Procedure   She has placement of PICC line.   05/11/2014 - 06/26/2014 Chemotherapy   She received 3 cycles of induction chemotherapy with Taxotere, cisplatin and 5-FU.   05/19/2014 - 05/22/2014 Hospital Admission   She was admitted to the hospital for management of neutropenic fever, GI bleed requiring blood transfusion and diarrhea from chemotherapy   07/19/2014 Imaging   Repeat PET/CT scan showed complete response to treatment   08/07/2014 - 09/24/2014 Radiation Therapy   Rec'd RT to base of tongue and bilateral neck:  70 Gy in 35 fractions to gross disease, 63 Gy in 35 fractions to high risk nodal echelons, 56 Gy in 35 fractons to intermediate risk nodal echelons.   10/05/2014 Procedure   PICC removed.   01/24/2015 PET scan   No definite evidence of recurrent or metastatic disease.     REVIEW OF SYSTEMS:   Constitutional: Denies fevers, chills or abnormal weight loss Eyes: Denies blurriness of vision Ears, nose, mouth, throat, and face: Denies mucositis or sore throat Respiratory: Denies cough, dyspnea or wheezes Cardiovascular: Denies palpitation, chest discomfort or lower extremity swelling Gastrointestinal:  Denies nausea, heartburn or change in bowel habits Skin: Denies abnormal skin rashes Lymphatics: Denies new lymphadenopathy or easy bruising Neurological:Denies numbness, tingling or new weaknesses Behavioral/Psych: Mood is stable, no new changes  All other systems were reviewed with the  patient and are negative.  I have reviewed the past medical history, past surgical history, social history and family history with the patient and they are unchanged from previous note.  ALLERGIES:  is allergic to adhesive [tape]  and penicillins.  MEDICATIONS:  Current Outpatient Medications  Medication Sig Dispense Refill  . ALPRAZolam (XANAX) 0.25 MG tablet Take 0.25 mg by mouth as needed for anxiety.    Marland Kitchen amLODipine (NORVASC) 10 MG tablet Take 5 mg by mouth daily.     Marland Kitchen aspirin 81 MG tablet Take 81 mg by mouth daily.    Marland Kitchen atorvastatin (LIPITOR) 10 MG tablet Take 10 mg by mouth daily.    Marland Kitchen levothyroxine (SYNTHROID) 112 MCG tablet Take 112 mcg by mouth daily before breakfast.    . Multiple Vitamin (MULTIVITAMIN) tablet Take 1 tablet by mouth daily.    . sennosides-docusate sodium (SENOKOT-S) 8.6-50 MG tablet Take 2 tablets by mouth 2 (two) times daily.    . sodium fluoride (FLUORISHIELD) 1.1 % GEL dental gel Instill one drop of gel per tooth space of fluoride tray. Place over teeth for 5 minutes. Remove. Spit out excess. Repeat nightly. 120 mL prn   No current facility-administered medications for this visit.    PHYSICAL EXAMINATION: ECOG PERFORMANCE STATUS: 0 - Asymptomatic  Vitals:   12/08/19 1401  BP: 134/72  Pulse: 82  Resp: 18  Temp: (!) 97.2 F (36.2 C)  SpO2: 99%   Filed Weights   12/08/19 1401  Weight: 209 lb 12.8 oz (95.2 kg)    GENERAL:alert, no distress and comfortable SKIN: skin color, texture, turgor are normal, no rashes or significant lesions EYES: normal, Conjunctiva are pink and non-injected, sclera clear OROPHARYNX:no exudate, no erythema and lips, buccal mucosa, and tongue normal  NECK: Noted lymphedema around her neck LYMPH:  no palpable lymphadenopathy in the cervical, axillary or inguinal LUNGS: clear to auscultation and percussion with normal breathing effort HEART: regular rate & rhythm and no murmurs and no lower extremity edema ABDOMEN:abdomen soft, non-tender and normal bowel sounds Musculoskeletal:no cyanosis of digits and no clubbing  NEURO: alert & oriented x 3 with fluent speech, no focal motor/sensory deficits  LABORATORY DATA:  I have reviewed the data as  listed    Component Value Date/Time   NA 138 09/06/2014 0910   K 4.1 09/06/2014 0910   CL 101 05/22/2014 0445   CO2 27 09/06/2014 0910   GLUCOSE 124 09/06/2014 0910   BUN 9.3 09/06/2014 0910   CREATININE 0.7 09/06/2014 0910   CALCIUM 9.6 09/06/2014 0910   PROT 7.6 09/06/2014 0910   ALBUMIN 3.8 09/06/2014 0910   AST 17 09/06/2014 0910   ALT 20 09/06/2014 0910   ALKPHOS 83 09/06/2014 0910   BILITOT 0.34 09/06/2014 0910   GFRNONAA >90 05/22/2014 0445   GFRAA >90 05/22/2014 0445    No results found for: SPEP, UPEP  Lab Results  Component Value Date   WBC 4.8 09/06/2014   NEUTROABS 3.8 09/06/2014   HGB 12.0 09/06/2014   HCT 36.2 09/06/2014   MCV 93.8 09/06/2014   PLT 188 09/06/2014      Chemistry      Component Value Date/Time   NA 138 09/06/2014 0910   K 4.1 09/06/2014 0910   CL 101 05/22/2014 0445   CO2 27 09/06/2014 0910   BUN 9.3 09/06/2014 0910   CREATININE 0.7 09/06/2014 0910      Component Value Date/Time   CALCIUM 9.6 09/06/2014 0910   ALKPHOS 83 09/06/2014  0910   AST 17 09/06/2014 0910   ALT 20 09/06/2014 0910   BILITOT 0.34 09/06/2014 0910

## 2020-03-13 DIAGNOSIS — R809 Proteinuria, unspecified: Secondary | ICD-10-CM | POA: Diagnosis not present

## 2020-03-13 DIAGNOSIS — I10 Essential (primary) hypertension: Secondary | ICD-10-CM | POA: Diagnosis not present

## 2020-03-13 DIAGNOSIS — E7800 Pure hypercholesterolemia, unspecified: Secondary | ICD-10-CM | POA: Diagnosis not present

## 2020-03-13 DIAGNOSIS — E1129 Type 2 diabetes mellitus with other diabetic kidney complication: Secondary | ICD-10-CM | POA: Diagnosis not present

## 2020-03-16 DIAGNOSIS — Z23 Encounter for immunization: Secondary | ICD-10-CM | POA: Diagnosis not present

## 2020-03-18 DIAGNOSIS — E7800 Pure hypercholesterolemia, unspecified: Secondary | ICD-10-CM | POA: Diagnosis not present

## 2020-03-18 DIAGNOSIS — E1129 Type 2 diabetes mellitus with other diabetic kidney complication: Secondary | ICD-10-CM | POA: Diagnosis not present

## 2020-03-18 DIAGNOSIS — E039 Hypothyroidism, unspecified: Secondary | ICD-10-CM | POA: Diagnosis not present

## 2020-03-18 DIAGNOSIS — I10 Essential (primary) hypertension: Secondary | ICD-10-CM | POA: Diagnosis not present

## 2020-03-28 DIAGNOSIS — Z1231 Encounter for screening mammogram for malignant neoplasm of breast: Secondary | ICD-10-CM | POA: Diagnosis not present

## 2020-03-28 DIAGNOSIS — Z13820 Encounter for screening for osteoporosis: Secondary | ICD-10-CM | POA: Diagnosis not present

## 2020-04-18 DIAGNOSIS — Z01419 Encounter for gynecological examination (general) (routine) without abnormal findings: Secondary | ICD-10-CM | POA: Diagnosis not present

## 2020-04-18 DIAGNOSIS — Z6833 Body mass index (BMI) 33.0-33.9, adult: Secondary | ICD-10-CM | POA: Diagnosis not present

## 2020-09-25 DIAGNOSIS — Z0000 Encounter for general adult medical examination without abnormal findings: Secondary | ICD-10-CM | POA: Diagnosis not present

## 2020-09-25 DIAGNOSIS — E1129 Type 2 diabetes mellitus with other diabetic kidney complication: Secondary | ICD-10-CM | POA: Diagnosis not present

## 2020-09-25 DIAGNOSIS — I10 Essential (primary) hypertension: Secondary | ICD-10-CM | POA: Diagnosis not present

## 2020-09-25 DIAGNOSIS — E7800 Pure hypercholesterolemia, unspecified: Secondary | ICD-10-CM | POA: Diagnosis not present

## 2020-09-25 DIAGNOSIS — E039 Hypothyroidism, unspecified: Secondary | ICD-10-CM | POA: Diagnosis not present

## 2020-11-13 DIAGNOSIS — D2239 Melanocytic nevi of other parts of face: Secondary | ICD-10-CM | POA: Diagnosis not present

## 2020-11-13 DIAGNOSIS — L570 Actinic keratosis: Secondary | ICD-10-CM | POA: Diagnosis not present

## 2020-11-13 DIAGNOSIS — Z85828 Personal history of other malignant neoplasm of skin: Secondary | ICD-10-CM | POA: Diagnosis not present

## 2020-11-13 DIAGNOSIS — L820 Inflamed seborrheic keratosis: Secondary | ICD-10-CM | POA: Diagnosis not present

## 2021-02-27 DIAGNOSIS — Z209 Contact with and (suspected) exposure to unspecified communicable disease: Secondary | ICD-10-CM | POA: Diagnosis not present

## 2021-02-27 DIAGNOSIS — Z03818 Encounter for observation for suspected exposure to other biological agents ruled out: Secondary | ICD-10-CM | POA: Diagnosis not present

## 2021-03-19 DIAGNOSIS — R059 Cough, unspecified: Secondary | ICD-10-CM | POA: Diagnosis not present

## 2021-03-19 DIAGNOSIS — B349 Viral infection, unspecified: Secondary | ICD-10-CM | POA: Diagnosis not present

## 2021-05-29 DIAGNOSIS — Z6832 Body mass index (BMI) 32.0-32.9, adult: Secondary | ICD-10-CM | POA: Diagnosis not present

## 2021-05-29 DIAGNOSIS — Z01419 Encounter for gynecological examination (general) (routine) without abnormal findings: Secondary | ICD-10-CM | POA: Diagnosis not present

## 2021-05-29 DIAGNOSIS — Z1231 Encounter for screening mammogram for malignant neoplasm of breast: Secondary | ICD-10-CM | POA: Diagnosis not present

## 2021-06-04 DIAGNOSIS — N900 Mild vulvar dysplasia: Secondary | ICD-10-CM | POA: Diagnosis not present

## 2021-06-04 DIAGNOSIS — N904 Leukoplakia of vulva: Secondary | ICD-10-CM | POA: Diagnosis not present

## 2021-10-01 DIAGNOSIS — L900 Lichen sclerosus et atrophicus: Secondary | ICD-10-CM | POA: Diagnosis not present

## 2021-10-08 DIAGNOSIS — E039 Hypothyroidism, unspecified: Secondary | ICD-10-CM | POA: Diagnosis not present

## 2021-10-08 DIAGNOSIS — I10 Essential (primary) hypertension: Secondary | ICD-10-CM | POA: Diagnosis not present

## 2021-10-08 DIAGNOSIS — Z0000 Encounter for general adult medical examination without abnormal findings: Secondary | ICD-10-CM | POA: Diagnosis not present

## 2021-10-08 DIAGNOSIS — Z23 Encounter for immunization: Secondary | ICD-10-CM | POA: Diagnosis not present

## 2021-10-08 DIAGNOSIS — E1129 Type 2 diabetes mellitus with other diabetic kidney complication: Secondary | ICD-10-CM | POA: Diagnosis not present

## 2021-10-08 DIAGNOSIS — E7800 Pure hypercholesterolemia, unspecified: Secondary | ICD-10-CM | POA: Diagnosis not present

## 2021-11-05 DIAGNOSIS — Z800 Family history of malignant neoplasm of digestive organs: Secondary | ICD-10-CM | POA: Diagnosis not present

## 2021-11-05 DIAGNOSIS — K644 Residual hemorrhoidal skin tags: Secondary | ICD-10-CM | POA: Diagnosis not present

## 2021-11-05 DIAGNOSIS — K5730 Diverticulosis of large intestine without perforation or abscess without bleeding: Secondary | ICD-10-CM | POA: Diagnosis not present

## 2021-11-05 DIAGNOSIS — K648 Other hemorrhoids: Secondary | ICD-10-CM | POA: Diagnosis not present

## 2021-11-05 DIAGNOSIS — Z1211 Encounter for screening for malignant neoplasm of colon: Secondary | ICD-10-CM | POA: Diagnosis not present

## 2021-12-17 DIAGNOSIS — Z85828 Personal history of other malignant neoplasm of skin: Secondary | ICD-10-CM | POA: Diagnosis not present

## 2021-12-17 DIAGNOSIS — L821 Other seborrheic keratosis: Secondary | ICD-10-CM | POA: Diagnosis not present

## 2021-12-17 DIAGNOSIS — D2239 Melanocytic nevi of other parts of face: Secondary | ICD-10-CM | POA: Diagnosis not present

## 2021-12-17 DIAGNOSIS — D225 Melanocytic nevi of trunk: Secondary | ICD-10-CM | POA: Diagnosis not present

## 2021-12-17 DIAGNOSIS — D485 Neoplasm of uncertain behavior of skin: Secondary | ICD-10-CM | POA: Diagnosis not present

## 2021-12-17 DIAGNOSIS — L570 Actinic keratosis: Secondary | ICD-10-CM | POA: Diagnosis not present

## 2022-01-07 DIAGNOSIS — M25512 Pain in left shoulder: Secondary | ICD-10-CM | POA: Diagnosis not present

## 2022-01-15 DIAGNOSIS — M25512 Pain in left shoulder: Secondary | ICD-10-CM | POA: Diagnosis not present

## 2022-05-05 DIAGNOSIS — E7800 Pure hypercholesterolemia, unspecified: Secondary | ICD-10-CM | POA: Diagnosis not present

## 2022-05-05 DIAGNOSIS — E1129 Type 2 diabetes mellitus with other diabetic kidney complication: Secondary | ICD-10-CM | POA: Diagnosis not present

## 2022-05-05 DIAGNOSIS — I10 Essential (primary) hypertension: Secondary | ICD-10-CM | POA: Diagnosis not present

## 2022-05-05 DIAGNOSIS — R809 Proteinuria, unspecified: Secondary | ICD-10-CM | POA: Diagnosis not present

## 2022-05-05 DIAGNOSIS — E039 Hypothyroidism, unspecified: Secondary | ICD-10-CM | POA: Diagnosis not present

## 2022-05-27 DIAGNOSIS — D485 Neoplasm of uncertain behavior of skin: Secondary | ICD-10-CM | POA: Diagnosis not present

## 2022-05-27 DIAGNOSIS — L570 Actinic keratosis: Secondary | ICD-10-CM | POA: Diagnosis not present

## 2022-05-27 DIAGNOSIS — B078 Other viral warts: Secondary | ICD-10-CM | POA: Diagnosis not present

## 2022-08-19 DIAGNOSIS — Z1231 Encounter for screening mammogram for malignant neoplasm of breast: Secondary | ICD-10-CM | POA: Diagnosis not present

## 2022-08-19 DIAGNOSIS — Z01419 Encounter for gynecological examination (general) (routine) without abnormal findings: Secondary | ICD-10-CM | POA: Diagnosis not present

## 2022-08-19 DIAGNOSIS — Z6831 Body mass index (BMI) 31.0-31.9, adult: Secondary | ICD-10-CM | POA: Diagnosis not present

## 2022-10-06 DIAGNOSIS — R059 Cough, unspecified: Secondary | ICD-10-CM | POA: Diagnosis not present

## 2022-10-06 DIAGNOSIS — J069 Acute upper respiratory infection, unspecified: Secondary | ICD-10-CM | POA: Diagnosis not present

## 2022-10-22 DIAGNOSIS — E1129 Type 2 diabetes mellitus with other diabetic kidney complication: Secondary | ICD-10-CM | POA: Diagnosis not present

## 2022-10-22 DIAGNOSIS — E7800 Pure hypercholesterolemia, unspecified: Secondary | ICD-10-CM | POA: Diagnosis not present

## 2022-10-22 DIAGNOSIS — Z0000 Encounter for general adult medical examination without abnormal findings: Secondary | ICD-10-CM | POA: Diagnosis not present

## 2022-10-22 DIAGNOSIS — I10 Essential (primary) hypertension: Secondary | ICD-10-CM | POA: Diagnosis not present

## 2022-10-22 DIAGNOSIS — E039 Hypothyroidism, unspecified: Secondary | ICD-10-CM | POA: Diagnosis not present

## 2022-12-29 DIAGNOSIS — L820 Inflamed seborrheic keratosis: Secondary | ICD-10-CM | POA: Diagnosis not present

## 2022-12-29 DIAGNOSIS — D485 Neoplasm of uncertain behavior of skin: Secondary | ICD-10-CM | POA: Diagnosis not present

## 2022-12-29 DIAGNOSIS — B078 Other viral warts: Secondary | ICD-10-CM | POA: Diagnosis not present

## 2023-03-17 DIAGNOSIS — Z85828 Personal history of other malignant neoplasm of skin: Secondary | ICD-10-CM | POA: Diagnosis not present

## 2023-03-17 DIAGNOSIS — D2372 Other benign neoplasm of skin of left lower limb, including hip: Secondary | ICD-10-CM | POA: Diagnosis not present

## 2023-03-17 DIAGNOSIS — D2239 Melanocytic nevi of other parts of face: Secondary | ICD-10-CM | POA: Diagnosis not present

## 2023-03-17 DIAGNOSIS — D225 Melanocytic nevi of trunk: Secondary | ICD-10-CM | POA: Diagnosis not present

## 2023-04-03 DIAGNOSIS — Z23 Encounter for immunization: Secondary | ICD-10-CM | POA: Diagnosis not present

## 2023-04-26 DIAGNOSIS — E039 Hypothyroidism, unspecified: Secondary | ICD-10-CM | POA: Diagnosis not present

## 2023-04-26 DIAGNOSIS — I10 Essential (primary) hypertension: Secondary | ICD-10-CM | POA: Diagnosis not present

## 2023-04-26 DIAGNOSIS — E118 Type 2 diabetes mellitus with unspecified complications: Secondary | ICD-10-CM | POA: Diagnosis not present

## 2023-04-26 DIAGNOSIS — E7800 Pure hypercholesterolemia, unspecified: Secondary | ICD-10-CM | POA: Diagnosis not present

## 2023-10-29 DIAGNOSIS — Z0000 Encounter for general adult medical examination without abnormal findings: Secondary | ICD-10-CM | POA: Diagnosis not present

## 2023-10-29 DIAGNOSIS — E118 Type 2 diabetes mellitus with unspecified complications: Secondary | ICD-10-CM | POA: Diagnosis not present

## 2023-10-29 DIAGNOSIS — I10 Essential (primary) hypertension: Secondary | ICD-10-CM | POA: Diagnosis not present

## 2023-10-29 DIAGNOSIS — E782 Mixed hyperlipidemia: Secondary | ICD-10-CM | POA: Diagnosis not present

## 2023-10-29 DIAGNOSIS — E039 Hypothyroidism, unspecified: Secondary | ICD-10-CM | POA: Diagnosis not present

## 2024-02-01 DIAGNOSIS — Z1231 Encounter for screening mammogram for malignant neoplasm of breast: Secondary | ICD-10-CM | POA: Diagnosis not present

## 2024-02-01 DIAGNOSIS — Z01419 Encounter for gynecological examination (general) (routine) without abnormal findings: Secondary | ICD-10-CM | POA: Diagnosis not present

## 2024-02-01 DIAGNOSIS — Z6831 Body mass index (BMI) 31.0-31.9, adult: Secondary | ICD-10-CM | POA: Diagnosis not present

## 2024-02-16 DIAGNOSIS — H02831 Dermatochalasis of right upper eyelid: Secondary | ICD-10-CM | POA: Diagnosis not present

## 2024-02-16 DIAGNOSIS — H02422 Myogenic ptosis of left eyelid: Secondary | ICD-10-CM | POA: Diagnosis not present

## 2024-02-16 DIAGNOSIS — H02834 Dermatochalasis of left upper eyelid: Secondary | ICD-10-CM | POA: Diagnosis not present

## 2024-02-16 DIAGNOSIS — H0279 Other degenerative disorders of eyelid and periocular area: Secondary | ICD-10-CM | POA: Diagnosis not present

## 2024-02-16 DIAGNOSIS — Z01818 Encounter for other preprocedural examination: Secondary | ICD-10-CM | POA: Diagnosis not present

## 2024-02-16 DIAGNOSIS — H02411 Mechanical ptosis of right eyelid: Secondary | ICD-10-CM | POA: Diagnosis not present

## 2024-02-16 DIAGNOSIS — H57813 Brow ptosis, bilateral: Secondary | ICD-10-CM | POA: Diagnosis not present

## 2024-02-16 DIAGNOSIS — H53483 Generalized contraction of visual field, bilateral: Secondary | ICD-10-CM | POA: Diagnosis not present

## 2024-02-16 DIAGNOSIS — H02412 Mechanical ptosis of left eyelid: Secondary | ICD-10-CM | POA: Diagnosis not present

## 2024-02-16 DIAGNOSIS — H02423 Myogenic ptosis of bilateral eyelids: Secondary | ICD-10-CM | POA: Diagnosis not present

## 2024-02-16 DIAGNOSIS — H02413 Mechanical ptosis of bilateral eyelids: Secondary | ICD-10-CM | POA: Diagnosis not present

## 2024-02-16 DIAGNOSIS — H02421 Myogenic ptosis of right eyelid: Secondary | ICD-10-CM | POA: Diagnosis not present

## 2024-04-08 DIAGNOSIS — Z23 Encounter for immunization: Secondary | ICD-10-CM | POA: Diagnosis not present
